# Patient Record
Sex: Male | Born: 1940 | Race: White | Hispanic: No | Smoking: Never smoker
Health system: Southern US, Community
[De-identification: ages and names within clinical notes are randomized; demographics above are authoritative.]

## PROBLEM LIST (undated history)

## (undated) DIAGNOSIS — E785 Hyperlipidemia, unspecified: Secondary | ICD-10-CM

## (undated) DIAGNOSIS — M1990 Unspecified osteoarthritis, unspecified site: Secondary | ICD-10-CM

## (undated) DIAGNOSIS — I82409 Acute embolism and thrombosis of unspecified deep veins of unspecified lower extremity: Secondary | ICD-10-CM

## (undated) DIAGNOSIS — I10 Essential (primary) hypertension: Secondary | ICD-10-CM

## (undated) DIAGNOSIS — C4490 Unspecified malignant neoplasm of skin, unspecified: Secondary | ICD-10-CM

## (undated) DIAGNOSIS — E669 Obesity, unspecified: Secondary | ICD-10-CM

## (undated) HISTORY — DX: Obesity, unspecified: E66.9

## (undated) HISTORY — PX: CERVICAL DISCECTOMY: SHX98

## (undated) HISTORY — DX: Unspecified malignant neoplasm of skin, unspecified: C44.90

## (undated) HISTORY — DX: Hyperlipidemia, unspecified: E78.5

## (undated) HISTORY — DX: Unspecified osteoarthritis, unspecified site: M19.90

## (undated) HISTORY — DX: Acute embolism and thrombosis of unspecified deep veins of unspecified lower extremity: I82.409

---

## 1999-09-13 ENCOUNTER — Encounter: Admission: RE | Admit: 1999-09-13 | Discharge: 1999-12-12 | Payer: Self-pay | Admitting: Anesthesiology

## 2000-06-22 ENCOUNTER — Emergency Department (HOSPITAL_COMMUNITY): Admission: EM | Admit: 2000-06-22 | Discharge: 2000-06-22 | Payer: Self-pay | Admitting: *Deleted

## 2000-06-22 ENCOUNTER — Encounter: Payer: Self-pay | Admitting: *Deleted

## 2000-08-01 ENCOUNTER — Encounter: Payer: Self-pay | Admitting: Internal Medicine

## 2000-08-01 ENCOUNTER — Ambulatory Visit (HOSPITAL_COMMUNITY): Admission: RE | Admit: 2000-08-01 | Discharge: 2000-08-01 | Payer: Self-pay | Admitting: Internal Medicine

## 2000-08-18 ENCOUNTER — Emergency Department (HOSPITAL_COMMUNITY): Admission: EM | Admit: 2000-08-18 | Discharge: 2000-08-18 | Payer: Self-pay | Admitting: Emergency Medicine

## 2000-08-18 ENCOUNTER — Encounter: Payer: Self-pay | Admitting: Emergency Medicine

## 2001-02-05 HISTORY — PX: COLONOSCOPY W/ POLYPECTOMY: SHX1380

## 2001-03-05 ENCOUNTER — Encounter: Payer: Self-pay | Admitting: Internal Medicine

## 2001-03-05 ENCOUNTER — Ambulatory Visit (HOSPITAL_COMMUNITY): Admission: RE | Admit: 2001-03-05 | Discharge: 2001-03-05 | Payer: Self-pay | Admitting: Internal Medicine

## 2001-06-18 ENCOUNTER — Ambulatory Visit (HOSPITAL_COMMUNITY): Admission: RE | Admit: 2001-06-18 | Discharge: 2001-06-18 | Payer: Self-pay | Admitting: Internal Medicine

## 2002-03-12 ENCOUNTER — Ambulatory Visit (HOSPITAL_COMMUNITY): Admission: RE | Admit: 2002-03-12 | Discharge: 2002-03-12 | Payer: Self-pay | Admitting: Internal Medicine

## 2002-07-10 ENCOUNTER — Encounter: Payer: Self-pay | Admitting: Internal Medicine

## 2002-07-10 ENCOUNTER — Ambulatory Visit (HOSPITAL_COMMUNITY): Admission: RE | Admit: 2002-07-10 | Discharge: 2002-07-10 | Payer: Self-pay | Admitting: Internal Medicine

## 2003-04-06 ENCOUNTER — Ambulatory Visit (HOSPITAL_COMMUNITY): Admission: RE | Admit: 2003-04-06 | Discharge: 2003-04-06 | Payer: Self-pay | Admitting: Internal Medicine

## 2003-07-22 ENCOUNTER — Ambulatory Visit (HOSPITAL_COMMUNITY): Admission: RE | Admit: 2003-07-22 | Discharge: 2003-07-22 | Payer: Self-pay | Admitting: Internal Medicine

## 2003-09-14 ENCOUNTER — Encounter (INDEPENDENT_AMBULATORY_CARE_PROVIDER_SITE_OTHER): Payer: Self-pay | Admitting: Specialist

## 2003-09-14 ENCOUNTER — Ambulatory Visit (HOSPITAL_COMMUNITY): Admission: RE | Admit: 2003-09-14 | Discharge: 2003-09-14 | Payer: Self-pay | Admitting: Internal Medicine

## 2003-09-16 ENCOUNTER — Emergency Department (HOSPITAL_COMMUNITY): Admission: EM | Admit: 2003-09-16 | Discharge: 2003-09-17 | Payer: Self-pay | Admitting: Emergency Medicine

## 2003-11-24 ENCOUNTER — Ambulatory Visit (HOSPITAL_COMMUNITY): Admission: RE | Admit: 2003-11-24 | Discharge: 2003-11-24 | Payer: Self-pay | Admitting: Internal Medicine

## 2004-04-18 ENCOUNTER — Ambulatory Visit (HOSPITAL_COMMUNITY): Admission: RE | Admit: 2004-04-18 | Discharge: 2004-04-18 | Payer: Self-pay | Admitting: Internal Medicine

## 2004-04-25 ENCOUNTER — Ambulatory Visit (HOSPITAL_COMMUNITY): Admission: RE | Admit: 2004-04-25 | Discharge: 2004-04-25 | Payer: Self-pay | Admitting: Internal Medicine

## 2004-09-25 ENCOUNTER — Ambulatory Visit (HOSPITAL_COMMUNITY): Admission: RE | Admit: 2004-09-25 | Discharge: 2004-09-25 | Payer: Self-pay | Admitting: Internal Medicine

## 2005-01-03 ENCOUNTER — Ambulatory Visit: Payer: Self-pay | Admitting: Internal Medicine

## 2005-02-15 ENCOUNTER — Ambulatory Visit (HOSPITAL_COMMUNITY): Admission: RE | Admit: 2005-02-15 | Discharge: 2005-02-15 | Payer: Self-pay | Admitting: Internal Medicine

## 2005-05-28 ENCOUNTER — Ambulatory Visit: Payer: Self-pay | Admitting: Internal Medicine

## 2005-07-09 ENCOUNTER — Ambulatory Visit (HOSPITAL_COMMUNITY): Admission: RE | Admit: 2005-07-09 | Discharge: 2005-07-09 | Payer: Self-pay | Admitting: Internal Medicine

## 2005-12-11 ENCOUNTER — Ambulatory Visit (HOSPITAL_COMMUNITY): Admission: RE | Admit: 2005-12-11 | Discharge: 2005-12-11 | Payer: Self-pay | Admitting: Internal Medicine

## 2005-12-11 ENCOUNTER — Ambulatory Visit: Payer: Self-pay | Admitting: Internal Medicine

## 2005-12-14 ENCOUNTER — Ambulatory Visit (HOSPITAL_COMMUNITY): Admission: RE | Admit: 2005-12-14 | Discharge: 2005-12-14 | Payer: Self-pay | Admitting: Internal Medicine

## 2005-12-31 ENCOUNTER — Ambulatory Visit (HOSPITAL_COMMUNITY): Admission: RE | Admit: 2005-12-31 | Discharge: 2005-12-31 | Payer: Self-pay | Admitting: Internal Medicine

## 2007-12-23 ENCOUNTER — Ambulatory Visit (HOSPITAL_COMMUNITY): Admission: RE | Admit: 2007-12-23 | Discharge: 2007-12-23 | Payer: Self-pay | Admitting: Internal Medicine

## 2008-03-08 HISTORY — PX: TOTAL KNEE ARTHROPLASTY: SHX125

## 2008-03-23 ENCOUNTER — Inpatient Hospital Stay (HOSPITAL_COMMUNITY): Admission: RE | Admit: 2008-03-23 | Discharge: 2008-03-26 | Payer: Self-pay | Admitting: Orthopaedic Surgery

## 2009-02-05 HISTORY — PX: TOTAL KNEE ARTHROPLASTY: SHX125

## 2009-04-04 ENCOUNTER — Ambulatory Visit (HOSPITAL_COMMUNITY): Admission: RE | Admit: 2009-04-04 | Discharge: 2009-04-04 | Payer: Self-pay | Admitting: Internal Medicine

## 2009-05-06 HISTORY — PX: TOTAL SHOULDER ARTHROPLASTY: SHX126

## 2009-05-26 ENCOUNTER — Ambulatory Visit (HOSPITAL_COMMUNITY): Admission: RE | Admit: 2009-05-26 | Discharge: 2009-05-27 | Payer: Self-pay | Admitting: Orthopedic Surgery

## 2009-09-26 ENCOUNTER — Encounter
Admission: RE | Admit: 2009-09-26 | Discharge: 2009-09-26 | Payer: Self-pay | Admitting: Physical Medicine and Rehabilitation

## 2009-12-06 DIAGNOSIS — I82409 Acute embolism and thrombosis of unspecified deep veins of unspecified lower extremity: Secondary | ICD-10-CM

## 2009-12-06 HISTORY — DX: Acute embolism and thrombosis of unspecified deep veins of unspecified lower extremity: I82.409

## 2009-12-20 ENCOUNTER — Inpatient Hospital Stay (HOSPITAL_COMMUNITY): Admission: RE | Admit: 2009-12-20 | Discharge: 2009-12-24 | Payer: Self-pay | Admitting: Orthopedic Surgery

## 2009-12-22 ENCOUNTER — Encounter (INDEPENDENT_AMBULATORY_CARE_PROVIDER_SITE_OTHER): Payer: Self-pay | Admitting: Orthopedic Surgery

## 2009-12-22 ENCOUNTER — Ambulatory Visit: Payer: Self-pay | Admitting: Vascular Surgery

## 2009-12-24 ENCOUNTER — Encounter (INDEPENDENT_AMBULATORY_CARE_PROVIDER_SITE_OTHER): Payer: Self-pay | Admitting: *Deleted

## 2009-12-24 LAB — CONVERTED CEMR LAB
BUN: 10 mg/dL
Calcium: 8.2 mg/dL
Chloride: 96 meq/L
Creatinine, Ser: 0.75 mg/dL
Hemoglobin: 11 g/dL
MCV: 86 fL
WBC: 13.8 10*3/uL

## 2010-01-23 ENCOUNTER — Ambulatory Visit (HOSPITAL_COMMUNITY)
Admission: RE | Admit: 2010-01-23 | Discharge: 2010-01-23 | Payer: Self-pay | Source: Home / Self Care | Attending: Orthopedic Surgery | Admitting: Orthopedic Surgery

## 2010-01-23 ENCOUNTER — Encounter (INDEPENDENT_AMBULATORY_CARE_PROVIDER_SITE_OTHER): Payer: Self-pay | Admitting: Orthopedic Surgery

## 2010-02-14 ENCOUNTER — Encounter (INDEPENDENT_AMBULATORY_CARE_PROVIDER_SITE_OTHER): Payer: Self-pay | Admitting: *Deleted

## 2010-02-15 ENCOUNTER — Ambulatory Visit
Admission: RE | Admit: 2010-02-15 | Discharge: 2010-02-15 | Payer: Self-pay | Source: Home / Self Care | Attending: Cardiology | Admitting: Cardiology

## 2010-02-15 DIAGNOSIS — E785 Hyperlipidemia, unspecified: Secondary | ICD-10-CM | POA: Insufficient documentation

## 2010-02-15 DIAGNOSIS — E669 Obesity, unspecified: Secondary | ICD-10-CM | POA: Insufficient documentation

## 2010-02-16 ENCOUNTER — Encounter: Payer: Self-pay | Admitting: Cardiology

## 2010-02-20 ENCOUNTER — Encounter: Payer: Self-pay | Admitting: Cardiology

## 2010-02-20 ENCOUNTER — Encounter (INDEPENDENT_AMBULATORY_CARE_PROVIDER_SITE_OTHER): Payer: Self-pay | Admitting: *Deleted

## 2010-02-20 LAB — CONVERTED CEMR LAB
Basophils Absolute: 0 10*3/uL (ref 0.0–0.1)
Basophils Relative: 0 % (ref 0–1)
Eosinophils Absolute: 0.1 10*3/uL (ref 0.0–0.7)
Eosinophils Relative: 1 % (ref 0–5)
Hemoglobin: 14.9 g/dL (ref 13.0–17.0)
MCHC: 33.7 g/dL (ref 30.0–36.0)
MCV: 83.7 fL (ref 78.0–100.0)
Monocytes Absolute: 0.6 10*3/uL (ref 0.1–1.0)
Monocytes Relative: 8 % (ref 3–12)
Neutro Abs: 5.1 10*3/uL (ref 1.7–7.7)
RBC: 5.28 M/uL (ref 4.22–5.81)
RDW: 14.8 % (ref 11.5–15.5)

## 2010-02-24 ENCOUNTER — Encounter (INDEPENDENT_AMBULATORY_CARE_PROVIDER_SITE_OTHER): Payer: Self-pay | Admitting: *Deleted

## 2010-02-26 ENCOUNTER — Encounter: Payer: Self-pay | Admitting: Internal Medicine

## 2010-02-26 ENCOUNTER — Encounter: Payer: Self-pay | Admitting: Orthopedic Surgery

## 2010-02-27 ENCOUNTER — Ambulatory Visit: Admission: RE | Admit: 2010-02-27 | Discharge: 2010-02-27 | Payer: Self-pay | Source: Home / Self Care

## 2010-03-07 ENCOUNTER — Telehealth (INDEPENDENT_AMBULATORY_CARE_PROVIDER_SITE_OTHER): Payer: Self-pay | Admitting: *Deleted

## 2010-03-09 NOTE — Assessment & Plan Note (Signed)
Summary: **per Dr.Fanta for DVT management/tg   Visit Type:  Initial Consult Primary Provider:  Dr. Avon Gully   History of Present Illness: [name] is seen at the kind request of Dr. Felecia Shelling for treatment of left deep vein thrombosis.  This nice gentleman underwent left TKA in November, but was noted to have swelling of the left leg 3 days postoperatively at which time a duplex ultrasound study was positive for thrombosis.  With additional anticoagulation, all swelling in that leg has resolved.  He has done well with physical therapy and is walking fine with no discomfort.  INR was 2.1 two weeks ago.    Current Medications (verified): 1)  Daily-Vitamin  Tabs (Multiple Vitamin) .... Take 1 Tab Daily 2)  Vitamin D3 2000 Unit Caps (Cholecalciferol) .... Take 1 Tab Daily 3)  Propranolol-Hctz 40-25 Mg Tabs (Propranolol-Hctz) .... Take 1 Tab Daily 4)  Gabapentin 600 Mg Tabs (Gabapentin) .... Take 1 To 2 Tabs Two Times A Day 5)  Simvastatin 40 Mg Tabs (Simvastatin) .... Take 1 Tab Daily 6)  Glyburide-Metformin 5-500 Mg Tabs (Glyburide-Metformin) .... Take 1 Tab Daily 7)  Warfarin Sodium 5 Mg Tabs (Warfarin Sodium) .... Take As Directed Per Coumadin Clinic 8)  Percocet 5-325 Mg Tabs (Oxycodone-Acetaminophen) .... Take As Directed For Pain  Allergies (verified): No Known Drug Allergies  Comments:  Nurse/Medical Assistant: patient brought med list [facility] is patients pharmacy  Past History:  Family History: Last updated: 06-Mar-2010 Father: Died at age 27 is the result of neoplastic disease Mother: Deceased at age 63 with CVA Siblings: 3 of 4 brothers are deceased, one is the result of pneumonia and 2 due to lung cancer; living brother has cardiac problems.  3 of 5 sisters are alive.  One died with myocardial infarction and one with carcinoma of the lung.  Social History: Last updated: 03/06/2010 Employment-retired from electrical work Tobacco Use - No.  Alcohol Use -  no Regular Exercise - no Drug Use - no Single with 3 children  Past Medical History: Hypertension Hyperlipidemia Diabetes: Hemoglobin A1c of 7 in the in 12/2009 Obesity Deep vein thrombosis-left lower extremity in the in 12/2009 following left TKA Degenerative joint disease-left knee; cervical spine; lumbosacral spine  Past Surgical History: Left TKA-2011 Right TKA-03/2008 Right total shoulder arthroplasty-05/2009 Cervical discectomy and fusion Colonoscopy with polypectomy-2003  Carotid Doppler  Procedure date:  11/24/2003  Findings:       Clinical data:   Pre syncope.   IMPRESSION   There is mild calcific plaque adjacent to both carotid bifurcations,   but no evidence of hemodynamically significant stenosis.  Vertebral artery flow is cephalad bilaterally.  Carotid Doppler  Procedure date:  Mar 06, 2010  Findings:      Normal sinus rhythm Left axis deviation Nonspecific T wave abnormality No previous tracing for comparison.  -  Date:  12/24/2009    HgbA1c: 7    WBC: 13.8    HGB: 11    HCT: 30.9    PLT: 138    MCV: 86   Family History: Father: Died at age 2 is the result of neoplastic disease Mother: Deceased at age 96 with CVA Siblings: 3 of 4 brothers are deceased, one is the result of pneumonia and 2 due to lung cancer; living brother has cardiac problems.  3 of 5 sisters are alive.  One died with myocardial infarction and one with carcinoma of the lung.  Social History: Employment-retired from [employer] work Tobacco Use - No.  Alcohol Use -  no Regular Exercise - no Drug Use - no Single with 3 children  Review of Systems       History of hypertension that has been well treated; diffuse arthritic discomfort.  All other systems reviewed and are negative.  Vital Signs:  Patient profile:   70 year old male Height:      71 inches Weight:      212 pounds BMI:     29.67 O2 Sat:      97 % on Room air Pulse rate:   64 / minute BP sitting:   138 / 80   (left arm)  Vitals Entered By: Dreama Saa, CNA (February 15, 2010 1:28 PM)  O2 Flow:  Room air  Physical Exam  General:  Mildly overweight; well-developed; no acute distress: HEENT-[place]/AT; PERRL; EOM intact; conjunctiva and lids nl:  Neck-No JVD; no carotid bruits: Endocrine-No thyromegaly: Lungs-No tachypnea, clear without rales, rhonchi or wheezes: CV-normal PMI; normal S1 and S2; S4 present Abdomen-BS normal; soft and non-tender without masses or organomegaly: MS-No deformities, cyanosis or clubbing: Neurologic-Nl cranial nerves; symmetric strength and tone: Skin- Warm, no sig. lesions: Extremities-Nl distal pulses; no edema    Impression & Recommendations:  Problem # 1:  DEEP VENOUS THROMBOPHLEBITIS: POST-OP (ICD-453.40) Since deep vein thrombosis occurred in the setting of a clear precipitating event (orthopaedic surgery), only a 6 month course of anticoagulation is warranted.  Nearly 2 months have been completed, and we will plan to maintain him in [facility]madin Clinic until May.  Problem # 2:  HYPERLIPIDEMIA (ICD-272.4) No recent fasting lipid profiles are available for review.  In the absence of known vascular disease, more aggressive therapy is probably not warranted.     Problem # 3:  HYPERTENSION (ICD-401.1) Blood pressure control appears to be adequate.  Current medications will be continued.  BP today: 138/80  Labs Reviewed: K+: 3.7 (12/24/2009) Creat: : 0.75 (12/24/2009)     Other Orders: T-CBC w/Diff ([id number]) Hemoccult Cards (Take Home) (Hemoccult Cards)  Patient Instructions: 1)  Your physician recommends that you schedule a follow-up appointment in: May 2012 2)  Your physician recommends that you return for lab work [id number] 3)  Your physician has asked that you test your stool for blood. It is necessary to test 3 different stool specimens for accuracy. You will be given 3 hemoccult cards for specimen collection. For each stool specimen, place a  small portion of stool sample (from 2 different areas of the stool) into the 2 squares on the card. Close card. Repeat with 2 more stool specimens. Bring the cards back to the office for testing.

## 2010-03-09 NOTE — Medication Information (Signed)
Summary: ccr-lr  Anticoagulant Therapy  Managed by: Vashti Hey, RN PCP: Dr. Avon Gully Supervising MD: Dietrich Pates MD, Molly Maduro Indication 1: DVT Lab Used: [facility] [place] Site: [place] INR POC 1.4  Dietary changes: yes       Details: had vit k foods  Health status changes: no    Bleeding/hemorrhagic complications: no       Any missed doses?: yes     Details: missed 1 dose  Is patient compliant with meds? yes       Allergies: No Known Drug Allergies  Anticoagulation Management History:      The patient is taking warfarin and comes in today for a routine follow up visit.  Positive risk factors for bleeding include an age of 68 years or older and presence of serious comorbidities.  The bleeding index is 'intermediate risk'.  Positive CHADS2 values include History of HTN and History of Diabetes.  Negative CHADS2 values include Age > 68 years old.  Anticoagulation responsible provider: Dietrich Pates MD, Molly Maduro.  INR POC: 1.4.  Cuvette Lot#: [id number].    Anticoagulation Management Assessment/Plan:      The patient's current anticoagulation dose is Warfarin sodium 5 mg tabs: take as directed per coumadin clinic.  The target INR is 2.0-3.0.  The next INR is due 03/16/2010.  Anticoagulation instructions were given to patient.  Results were reviewed/authorized by Vashti Hey, RN.  He was notified by Vashti Hey RN.         Prior Anticoagulation Instructions: INR 5.4 Hold coumadin tonight and tomorrow night, take 1/2 tablet Friday night then resume 1/2 tablet once daily except 1 tablet on Fridays  Current Anticoagulation Instructions: INR 1.4 Take coumadin 1 tablet tonight then resume 2.5mg  once daily except 1 tablet on Fridays

## 2010-03-09 NOTE — Medication Information (Signed)
Summary: CCR  Anticoagulant Therapy  Managed by: Vashti Hey, RN Supervising MD: Dietrich Pates MD, Molly Maduro Indication 1: DVT Lab Used: [facility] [place] Site: [place] INR POC 5.4  Dietary changes: no    Health status changes: no    Bleeding/hemorrhagic complications: no    Recent/future hospitalizations: yes       Details: recent DVT  Dr Felecia Shelling check INr last  INR 2.1  Any changes in medication regimen? yes       Details: On zpak for bronchitis   Has 2 days left   Will finish 03/20/10  Recent/future dental: no  Any missed doses?: no       Is patient compliant with meds? yes       Allergies: No Known Drug Allergies  Anticoagulation Management History:      The patient is taking warfarin and comes in today for a routine follow up visit.  Positive risk factors for bleeding include an age of 68 years or older.  The bleeding index is 'intermediate risk'.  Negative CHADS2 values include Age > 51 years old.  Anticoagulation responsible provider: Dietrich Pates MD, Molly Maduro.  INR POC: 5.4.    Anticoagulation Management Assessment/Plan:      The target INR is 2.0-3.0.  The next INR is due 02/27/2010.  Anticoagulation instructions were given to patient.  Results were reviewed/authorized by Vashti Hey, RN.  He was notified by Vashti Hey RN.        Coagulation management information includes: May be able to come off coumadin in May 12.  Has F/U with RR then.  Current Anticoagulation Instructions: INR 5.4 Hold coumadin tonight and tomorrow night, take 1/2 tablet Friday night then resume 1/2 tablet once daily except 1 tablet on Fridays

## 2010-03-09 NOTE — Miscellaneous (Signed)
Summary: hemoccult cards 02/20/2010  Clinical Lists Changes  Observations: Added new observation of HEMOCCULT 3: neg (02/20/2010 16:24) Added new observation of HEMOCCULT 2: neg (02/20/2010 16:24) Added new observation of HEMOCCULT 1: neg (02/20/2010 16:24)

## 2010-03-13 ENCOUNTER — Encounter (HOSPITAL_COMMUNITY): Payer: Self-pay

## 2010-03-13 ENCOUNTER — Ambulatory Visit (HOSPITAL_COMMUNITY)
Admission: RE | Admit: 2010-03-13 | Discharge: 2010-03-13 | Disposition: A | Discharge status: Home / Self Care | Payer: Medicare HMO | Source: Ambulatory Visit | Attending: Internal Medicine | Admitting: Internal Medicine

## 2010-03-13 ENCOUNTER — Other Ambulatory Visit (HOSPITAL_COMMUNITY): Payer: Self-pay | Admitting: Internal Medicine

## 2010-03-13 DIAGNOSIS — J40 Bronchitis, not specified as acute or chronic: Secondary | ICD-10-CM

## 2010-03-13 DIAGNOSIS — R0789 Other chest pain: Secondary | ICD-10-CM | POA: Insufficient documentation

## 2010-03-13 DIAGNOSIS — R059 Cough, unspecified: Secondary | ICD-10-CM | POA: Insufficient documentation

## 2010-03-13 DIAGNOSIS — R05 Cough: Secondary | ICD-10-CM | POA: Insufficient documentation

## 2010-03-13 HISTORY — DX: Essential (primary) hypertension: I10

## 2010-03-15 NOTE — Progress Notes (Signed)
Summary: coumadin concerns  Phone Note Call from Patient   Caller: Patient Reason for Call: Talk to Nurse Summary of Call: patient states that since he has been on Coumadin he has been having "flu like" symptoms / advised patient that Coumadin would not give him those symptoms but he still would like to speak with you / tg Initial call taken by: Raechel Ache [facility],  March 07, 2010 8:52 AM  Follow-up for Phone Call        Pt states he has had 2 colds since starting on coumadin and Dr Felecia Shelling has put him on antibiotics.  Pt is concerned that coumadin is making him more prone to colds.  Told him coumadin was not causing him to catch cols and if Dr Felecia Shelling is putting him on Abx he must have some type of infection.  INR will be checked on 03/16/10 as ordered.  Pt verbalized understanding. Follow-up by: Vashti Hey RN,  March 07, 2010 9:07 AM

## 2010-03-16 ENCOUNTER — Encounter (INDEPENDENT_AMBULATORY_CARE_PROVIDER_SITE_OTHER): Payer: Medicare HMO

## 2010-03-16 ENCOUNTER — Encounter: Payer: Self-pay | Admitting: Cardiology

## 2010-03-16 DIAGNOSIS — Z7901 Long term (current) use of anticoagulants: Secondary | ICD-10-CM

## 2010-03-16 DIAGNOSIS — I80299 Phlebitis and thrombophlebitis of other deep vessels of unspecified lower extremity: Secondary | ICD-10-CM

## 2010-03-16 LAB — CONVERTED CEMR LAB: POC INR: 2.6

## 2010-03-23 NOTE — Medication Information (Signed)
Summary: Coumadin Clinic  Anticoagulant Therapy Managed by: Vashti Hey, RN Patient Assessment Part 2:  Have you MISSED ANY DOSES or CHANGED TABLETS?  0  Have you had any BRUISING or BLEEDING ( nose or gum bleeds,blood in urine or stool)?  Have you STARTED or STOPPED any MEDICATIONS, including OTC meds,herbals or supplements?  Have you CHANGED your DIET, especially green vegetables,or ALCOHOL intake?  Have you had any ILLNESSES or HOSPITALIZATIONS?  Have you had any signs of CLOTTING?(chest discomfort,dizziness,shortness of breath,arms tingling,slurred speech,swelling or redness in leg)       Regimen Out:    Total Weekly: 20 mg mg  Next INR Due: 04/06/2010      Allergies: No Known Drug Allergies  Anticoagulant Therapy  Managed by: Vashti Hey, RN PCP: Dr. Avon Gully Supervising MD: Diona Browner MD, Remi Deter Indication 1: DVT Lab Used: [facility] [place] Site: [place] INR POC 2.6  Dietary changes: no    Health status changes: no    Bleeding/hemorrhagic complications: no    Recent/future hospitalizations: no    Any changes in medication regimen? no    Recent/future dental: no  Any missed doses?: no       Is patient compliant with meds? yes         Anticoagulation Management History:      The patient is taking warfarin and comes in today for a routine follow up visit.  Positive risk factors for bleeding include an age of 62 years or older and presence of serious comorbidities.  The bleeding index is 'intermediate risk'.  Positive CHADS2 values include History of HTN and History of Diabetes.  Negative CHADS2 values include Age > 14 years old.  Anticoagulation responsible provider: Diona Browner MD, Remi Deter.  INR POC: 2.6.  Cuvette Lot#: [id number].    Anticoagulation Management Assessment/Plan:      The patient's current anticoagulation dose is Warfarin sodium 5 mg tabs: take as directed per coumadin clinic.  The target INR is 2.0-3.0.  The next INR  is due 04/06/2010.  Anticoagulation instructions were given to patient.  Results were reviewed/authorized by Vashti Hey, RN.  He was notified by Vashti Hey RN.         Prior Anticoagulation Instructions: INR 1.4 Take coumadin 1 tablet tonight then resume 2.5mg  once daily except 1 tablet on Fridays  Current Anticoagulation Instructions: INR 2.6 Continue coumadin 2.5mg  once daily except 5mg  on Fridays Scheduled for back injection on 03/31/10 by Dr Modesta Messing Pt told to hold couamdin 5 days prior to injection.  Will resume coumadin night of injection

## 2010-04-06 ENCOUNTER — Encounter: Payer: Self-pay | Admitting: Cardiology

## 2010-04-06 ENCOUNTER — Encounter (INDEPENDENT_AMBULATORY_CARE_PROVIDER_SITE_OTHER): Payer: Medicare HMO

## 2010-04-06 DIAGNOSIS — I80299 Phlebitis and thrombophlebitis of other deep vessels of unspecified lower extremity: Secondary | ICD-10-CM

## 2010-04-06 LAB — CONVERTED CEMR LAB: POC INR: 1.6

## 2010-04-13 NOTE — Medication Information (Signed)
Summary: ccr-lr  Anticoagulant Therapy  Managed by: Vashti Hey, RN PCP: Dr. Avon Gully Supervising MD: Diona Browner MD, Remi Deter Indication 1: DVT Lab Used: [facility] [place] Site: [place] INR POC 1.6  Dietary changes: no    Health status changes: no    Bleeding/hemorrhagic complications: no    Recent/future hospitalizations: no    Any changes in medication regimen? no    Recent/future dental: no  Any missed doses?: yes     Details: was off coumadin 5 days prior to back injection on 03/31/10  Is patient compliant with meds? yes       Allergies: No Known Drug Allergies  Anticoagulation Management History:      The patient is taking warfarin and comes in today for a routine follow up visit.  Positive risk factors for bleeding include an age of 12 years or older and presence of serious comorbidities.  The bleeding index is 'intermediate risk'.  Positive CHADS2 values include History of HTN and History of Diabetes.  Negative CHADS2 values include Age > 76 years old.  Anticoagulation responsible provider: Diona Browner MD, Remi Deter.  INR POC: 1.6.  Cuvette Lot#: [id number].    Anticoagulation Management Assessment/Plan:      The patient's current anticoagulation dose is Warfarin sodium 5 mg tabs: take as directed per coumadin clinic.  The target INR is 2.0-3.0.  The next INR is due 04/24/2010.  Anticoagulation instructions were given to patient.  Results were reviewed/authorized by Vashti Hey, RN.  He was notified by Vashti Hey RN.         Prior Anticoagulation Instructions: INR 2.6 Continue coumadin 2.5mg  once daily except 5mg  on Fridays Scheduled for back injection on 03/31/10 by Dr Modesta Messing Pt told to hold couamdin 5 days prior to injection.  Will resume coumadin night of injection  Current Anticoagulation Instructions: INR 1.6 Take coumadin 1 1/2 tablets tonight then resume 1/2 tablet once daily except 1 tablet on Fridays

## 2010-04-18 LAB — CBC
HCT: 45.3 % (ref 39.0–52.0)
Hemoglobin: 11.4 g/dL — ABNORMAL LOW (ref 13.0–17.0)
Hemoglobin: 16.3 g/dL (ref 13.0–17.0)
MCH: 30.4 pg (ref 26.0–34.0)
MCH: 30.7 pg (ref 26.0–34.0)
MCHC: 35.6 g/dL (ref 30.0–36.0)
MCV: 85.5 fL (ref 78.0–100.0)
MCV: 86.1 fL (ref 78.0–100.0)
MCV: 86.3 fL (ref 78.0–100.0)
MCV: 87.1 fL (ref 78.0–100.0)
Platelets: 114 10*3/uL — ABNORMAL LOW (ref 150–400)
Platelets: 134 10*3/uL — ABNORMAL LOW (ref 150–400)
Platelets: 138 10*3/uL — ABNORMAL LOW (ref 150–400)
RBC: 3.58 MIL/uL — ABNORMAL LOW (ref 4.22–5.81)
RBC: 3.75 MIL/uL — ABNORMAL LOW (ref 4.22–5.81)
RDW: 12.8 % (ref 11.5–15.5)
RDW: 13.3 % (ref 11.5–15.5)
WBC: 11.2 10*3/uL — ABNORMAL HIGH (ref 4.0–10.5)
WBC: 7.4 10*3/uL (ref 4.0–10.5)

## 2010-04-18 LAB — BASIC METABOLIC PANEL
BUN: 10 mg/dL (ref 6–23)
BUN: 5 mg/dL — ABNORMAL LOW (ref 6–23)
CO2: 27 mEq/L (ref 19–32)
CO2: 32 mEq/L (ref 19–32)
Calcium: 8.2 mg/dL — ABNORMAL LOW (ref 8.4–10.5)
Calcium: 8.4 mg/dL (ref 8.4–10.5)
Calcium: 8.8 mg/dL (ref 8.4–10.5)
Chloride: 104 mEq/L (ref 96–112)
Chloride: 91 mEq/L — ABNORMAL LOW (ref 96–112)
Chloride: 96 mEq/L (ref 96–112)
Creatinine, Ser: 0.71 mg/dL (ref 0.4–1.5)
Creatinine, Ser: 0.75 mg/dL (ref 0.4–1.5)
Creatinine, Ser: 0.76 mg/dL (ref 0.4–1.5)
Creatinine, Ser: 0.84 mg/dL (ref 0.4–1.5)
GFR calc Af Amer: >60 mL/min (ref >60)
GFR calc Af Amer: >60 mL/min (ref >60)
GFR calc non Af Amer: >60 mL/min (ref >60)
GFR calc non Af Amer: >60 mL/min (ref >60)
GFR calc non Af Amer: >60 mL/min (ref >60)
GFR calc non Af Amer: >60 mL/min (ref >60)
Glucose, Bld: 115 mg/dL — ABNORMAL HIGH (ref 70–99)
Glucose, Bld: 163 mg/dL — ABNORMAL HIGH (ref 70–99)
Potassium: 3.8 mEq/L (ref 3.5–5.1)
Sodium: 136 mEq/L (ref 135–145)

## 2010-04-18 LAB — URINALYSIS, ROUTINE W REFLEX MICROSCOPIC
Bilirubin Urine: NEGATIVE
Ketones, ur: 40 mg/dL — AB
Nitrite: NEGATIVE
Urobilinogen, UA: 1 mg/dL (ref 0.0–1.0)

## 2010-04-18 LAB — GLUCOSE, CAPILLARY
Glucose-Capillary: 148 mg/dL — ABNORMAL HIGH (ref 70–99)
Glucose-Capillary: 191 mg/dL — ABNORMAL HIGH (ref 70–99)
Glucose-Capillary: 191 mg/dL — ABNORMAL HIGH (ref 70–99)
Glucose-Capillary: 193 mg/dL — ABNORMAL HIGH (ref 70–99)
Glucose-Capillary: 219 mg/dL — ABNORMAL HIGH (ref 70–99)
Glucose-Capillary: 227 mg/dL — ABNORMAL HIGH (ref 70–99)
Glucose-Capillary: 254 mg/dL — ABNORMAL HIGH (ref 70–99)

## 2010-04-18 LAB — HEMOGLOBIN A1C
Hgb A1c MFr Bld: 7 % — ABNORMAL HIGH (ref <5.7)
Mean Plasma Glucose: 154 mg/dL — ABNORMAL HIGH (ref <117)

## 2010-04-18 LAB — TYPE AND SCREEN
ABO/RH(D): A NEG
Antibody Screen: NEGATIVE

## 2010-04-18 LAB — PROTIME-INR
INR: 1.02 (ref 0.00–1.49)
INR: 2.57 — ABNORMAL HIGH (ref 0.00–1.49)
Prothrombin Time: 17.5 seconds — ABNORMAL HIGH (ref 11.6–15.2)

## 2010-04-18 LAB — SURGICAL PCR SCREEN
MRSA, PCR: NEGATIVE
Staphylococcus aureus: NEGATIVE

## 2010-04-24 ENCOUNTER — Encounter: Payer: Self-pay | Admitting: Cardiology

## 2010-04-24 ENCOUNTER — Encounter (INDEPENDENT_AMBULATORY_CARE_PROVIDER_SITE_OTHER): Payer: Medicare HMO

## 2010-04-24 DIAGNOSIS — Z7901 Long term (current) use of anticoagulants: Secondary | ICD-10-CM

## 2010-04-24 DIAGNOSIS — I80299 Phlebitis and thrombophlebitis of other deep vessels of unspecified lower extremity: Secondary | ICD-10-CM

## 2010-04-25 LAB — URINALYSIS, ROUTINE W REFLEX MICROSCOPIC
Bilirubin Urine: NEGATIVE
Glucose, UA: NEGATIVE mg/dL
Hgb urine dipstick: NEGATIVE
Ketones, ur: NEGATIVE mg/dL
Specific Gravity, Urine: 1.022 (ref 1.005–1.030)
pH: 6 (ref 5.0–8.0)

## 2010-04-25 LAB — CBC
HCT: 46.4 % (ref 39.0–52.0)
Hemoglobin: 16.4 g/dL (ref 13.0–17.0)
MCHC: 35.3 g/dL (ref 30.0–36.0)
RDW: 14.1 % (ref 11.5–15.5)

## 2010-04-25 LAB — COMPREHENSIVE METABOLIC PANEL
BUN: 12 mg/dL (ref 6–23)
Calcium: 9.8 mg/dL (ref 8.4–10.5)
Glucose, Bld: 109 mg/dL — ABNORMAL HIGH (ref 70–99)
Sodium: 139 mEq/L (ref 135–145)
Total Protein: 6.5 g/dL (ref 6.0–8.3)

## 2010-04-25 LAB — PROTIME-INR
INR: 1.09 (ref 0.00–1.49)
Prothrombin Time: 14 seconds (ref 11.6–15.2)

## 2010-04-25 LAB — GLUCOSE, CAPILLARY
Glucose-Capillary: 210 mg/dL — ABNORMAL HIGH (ref 70–99)
Glucose-Capillary: 217 mg/dL — ABNORMAL HIGH (ref 70–99)
Glucose-Capillary: 231 mg/dL — ABNORMAL HIGH (ref 70–99)

## 2010-05-04 NOTE — Medication Information (Signed)
Summary: ccr-lr  Anticoagulant Therapy  Managed by: Vashti Hey, RN PCP: Dr. Avon Gully Supervising MD: Dietrich Pates MD, Molly Maduro Indication 1: DVT Lab Used: [facility] [place] Site: [place] INR POC 2.6  Dietary changes: no    Health status changes: no    Bleeding/hemorrhagic complications: no    Recent/future hospitalizations: no    Any changes in medication regimen? no    Recent/future dental: no  Any missed doses?: no       Is patient compliant with meds? yes       Allergies: No Known Drug Allergies  Anticoagulation Management History:      The patient is taking warfarin and comes in today for a routine follow up visit.  Positive risk factors for bleeding include an age of 69 years or older and presence of serious comorbidities.  The bleeding index is 'intermediate risk'.  Positive CHADS2 values include History of HTN and History of Diabetes.  Negative CHADS2 values include Age > 47 years old.  Anticoagulation responsible provider: Dietrich Pates MD, Molly Maduro.  INR POC: 2.6.  Cuvette Lot#: [id number].    Anticoagulation Management Assessment/Plan:      The patient's current anticoagulation dose is Warfarin sodium 5 mg tabs: take as directed per coumadin clinic.  The target INR is 2.0-3.0.  The next INR is due 05/15/2010.  Anticoagulation instructions were given to patient.  Results were reviewed/authorized by Vashti Hey, RN.  He was notified by Vashti Hey RN.         Prior Anticoagulation Instructions: INR 1.6 Take coumadin 1 1/2 tablets tonight then resume 1/2 tablet once daily except 1 tablet on Fridays  Current Anticoagulation Instructions: INR 2.6 Continue coumadin 2.5mg  once daily except 5mg  on Fridays

## 2010-05-05 ENCOUNTER — Encounter: Payer: Self-pay | Admitting: Cardiology

## 2010-05-05 DIAGNOSIS — I82409 Acute embolism and thrombosis of unspecified deep veins of unspecified lower extremity: Secondary | ICD-10-CM

## 2010-05-15 ENCOUNTER — Ambulatory Visit (INDEPENDENT_AMBULATORY_CARE_PROVIDER_SITE_OTHER): Payer: Medicare HMO | Admitting: *Deleted

## 2010-05-15 DIAGNOSIS — Z7901 Long term (current) use of anticoagulants: Secondary | ICD-10-CM

## 2010-05-15 DIAGNOSIS — I82409 Acute embolism and thrombosis of unspecified deep veins of unspecified lower extremity: Secondary | ICD-10-CM

## 2010-05-15 LAB — POCT INR: INR: 3.1

## 2010-05-23 LAB — URINALYSIS, ROUTINE W REFLEX MICROSCOPIC
Bilirubin Urine: NEGATIVE
Glucose, UA: NEGATIVE mg/dL
Hgb urine dipstick: NEGATIVE
Protein, ur: NEGATIVE mg/dL

## 2010-05-23 LAB — COMPREHENSIVE METABOLIC PANEL
ALT: 35 U/L (ref 0–53)
Alkaline Phosphatase: 68 U/L (ref 39–117)
BUN: 11 mg/dL (ref 6–23)
CO2: 25 mEq/L (ref 19–32)
Calcium: 9.5 mg/dL (ref 8.4–10.5)
GFR calc non Af Amer: >60 mL/min (ref >60)
Glucose, Bld: 129 mg/dL — ABNORMAL HIGH (ref 70–99)
Potassium: 4 mEq/L (ref 3.5–5.1)
Sodium: 137 mEq/L (ref 135–145)
Total Protein: 6.4 g/dL (ref 6.0–8.3)

## 2010-05-23 LAB — CROSSMATCH

## 2010-05-23 LAB — CBC
HCT: 42.5 % (ref 39.0–52.0)
HCT: 33.6 % — ABNORMAL LOW (ref 39.0–52.0)
Hemoglobin: 14.8 g/dL (ref 13.0–17.0)
Hemoglobin: 12.3 g/dL — ABNORMAL LOW (ref 13.0–17.0)
MCHC: 34.9 g/dL (ref 30.0–36.0)
MCHC: 35.2 g/dL (ref 30.0–36.0)
MCHC: 35.7 g/dL (ref 30.0–36.0)
Platelets: 103 10*3/uL — ABNORMAL LOW (ref 150–400)
Platelets: 95 10*3/uL — ABNORMAL LOW (ref 150–400)
RBC: 4.6 MIL/uL (ref 4.22–5.81)
RBC: 3.3 MIL/uL — ABNORMAL LOW (ref 4.22–5.81)
RBC: 3.76 MIL/uL — ABNORMAL LOW (ref 4.22–5.81)
RBC: 4.23 MIL/uL (ref 4.22–5.81)
RDW: 12.9 % (ref 11.5–15.5)
RDW: 12.8 % (ref 11.5–15.5)
RDW: 12.8 % (ref 11.5–15.5)
RDW: 13 % (ref 11.5–15.5)
WBC: 14.6 10*3/uL — ABNORMAL HIGH (ref 4.0–10.5)
WBC: 9.8 10*3/uL (ref 4.0–10.5)

## 2010-05-23 LAB — DIFFERENTIAL
Basophils Absolute: 0 10*3/uL (ref 0.0–0.1)
Basophils Absolute: 0 10*3/uL (ref 0.0–0.1)
Basophils Absolute: 0 10*3/uL (ref 0.0–0.1)
Basophils Absolute: 0 10*3/uL (ref 0.0–0.1)
Basophils Absolute: 0 10*3/uL (ref 0.0–0.1)
Basophils Relative: 0 % (ref 0–1)
Basophils Relative: 0 % (ref 0–1)
Basophils Relative: 0 % (ref 0–1)
Eosinophils Absolute: 0.1 10*3/uL (ref 0.0–0.7)
Eosinophils Relative: 0 % (ref 0–5)
Lymphocytes Relative: 16 % (ref 12–46)
Lymphocytes Relative: 5 % — ABNORMAL LOW (ref 12–46)
Lymphs Abs: 1.5 10*3/uL (ref 0.7–4.0)
Monocytes Absolute: 0.4 10*3/uL (ref 0.1–1.0)
Monocytes Absolute: 0.6 10*3/uL (ref 0.1–1.0)
Monocytes Relative: 6 % (ref 3–12)
Monocytes Relative: 4 % (ref 3–12)
Monocytes Relative: 6 % (ref 3–12)
Neutro Abs: 11.1 10*3/uL — ABNORMAL HIGH (ref 1.7–7.7)
Neutro Abs: 13.3 10*3/uL — ABNORMAL HIGH (ref 1.7–7.7)
Neutro Abs: 7.5 10*3/uL (ref 1.7–7.7)
Neutro Abs: 9.3 10*3/uL — ABNORMAL HIGH (ref 1.7–7.7)
Neutrophils Relative %: 77 % (ref 43–77)
Neutrophils Relative %: 80 % — ABNORMAL HIGH (ref 43–77)
Neutrophils Relative %: 84 % — ABNORMAL HIGH (ref 43–77)

## 2010-05-23 LAB — BASIC METABOLIC PANEL
BUN: 8 mg/dL (ref 6–23)
CO2: 28 mEq/L (ref 19–32)
CO2: 30 mEq/L (ref 19–32)
CO2: 30 mEq/L (ref 19–32)
Calcium: 8.4 mg/dL (ref 8.4–10.5)
Calcium: 8.9 mg/dL (ref 8.4–10.5)
Calcium: 9 mg/dL (ref 8.4–10.5)
Chloride: 101 mEq/L (ref 96–112)
Creatinine, Ser: 0.68 mg/dL (ref 0.4–1.5)
Creatinine, Ser: 0.73 mg/dL (ref 0.4–1.5)
Creatinine, Ser: 0.82 mg/dL (ref 0.4–1.5)
GFR calc Af Amer: >60 mL/min (ref >60)
GFR calc Af Amer: >60 mL/min (ref >60)
GFR calc Af Amer: >60 mL/min (ref >60)
GFR calc non Af Amer: >60 mL/min (ref >60)
GFR calc non Af Amer: >60 mL/min (ref >60)
Glucose, Bld: 154 mg/dL — ABNORMAL HIGH (ref 70–99)
Glucose, Bld: 190 mg/dL — ABNORMAL HIGH (ref 70–99)
Sodium: 133 mEq/L — ABNORMAL LOW (ref 135–145)

## 2010-05-23 LAB — GLUCOSE, CAPILLARY
Glucose-Capillary: 139 mg/dL — ABNORMAL HIGH (ref 70–99)
Glucose-Capillary: 145 mg/dL — ABNORMAL HIGH (ref 70–99)
Glucose-Capillary: 153 mg/dL — ABNORMAL HIGH (ref 70–99)
Glucose-Capillary: 156 mg/dL — ABNORMAL HIGH (ref 70–99)
Glucose-Capillary: 159 mg/dL — ABNORMAL HIGH (ref 70–99)
Glucose-Capillary: 173 mg/dL — ABNORMAL HIGH (ref 70–99)
Glucose-Capillary: 195 mg/dL — ABNORMAL HIGH (ref 70–99)
Glucose-Capillary: 213 mg/dL — ABNORMAL HIGH (ref 70–99)

## 2010-06-06 ENCOUNTER — Ambulatory Visit: Payer: Medicare HMO | Admitting: Cardiology

## 2010-06-12 ENCOUNTER — Ambulatory Visit (INDEPENDENT_AMBULATORY_CARE_PROVIDER_SITE_OTHER): Payer: Medicare HMO | Admitting: *Deleted

## 2010-06-12 ENCOUNTER — Encounter: Payer: Self-pay | Admitting: Cardiology

## 2010-06-12 ENCOUNTER — Ambulatory Visit (INDEPENDENT_AMBULATORY_CARE_PROVIDER_SITE_OTHER): Payer: Medicare HMO | Admitting: Cardiology

## 2010-06-12 DIAGNOSIS — E119 Type 2 diabetes mellitus without complications: Secondary | ICD-10-CM | POA: Insufficient documentation

## 2010-06-12 DIAGNOSIS — E785 Hyperlipidemia, unspecified: Secondary | ICD-10-CM

## 2010-06-12 DIAGNOSIS — M1990 Unspecified osteoarthritis, unspecified site: Secondary | ICD-10-CM

## 2010-06-12 DIAGNOSIS — I82409 Acute embolism and thrombosis of unspecified deep veins of unspecified lower extremity: Secondary | ICD-10-CM

## 2010-06-12 DIAGNOSIS — I10 Essential (primary) hypertension: Secondary | ICD-10-CM

## 2010-06-12 DIAGNOSIS — I739 Peripheral vascular disease, unspecified: Secondary | ICD-10-CM | POA: Insufficient documentation

## 2010-06-12 LAB — POCT INR: INR: 2.8

## 2010-06-12 NOTE — Assessment & Plan Note (Addendum)
Based upon Doppler study in office, patient has asymptomatic peripheral vascular disease.  With excellent pulses over both posterior tibials as well as the right dorsalis pedis, he hopefully will not develop claudication.  To prevent this, optimal control of vascular risk factors should be pursued.  I will be happy to reassess this nice gentleman at anytime in the future that Dr. Felecia Shelling deems appropriate.  I've also encouraged him to call for peripheral edema, leg pain or other cardiovascular symptoms, should they develop.

## 2010-06-12 NOTE — Patient Instructions (Signed)
Your physician recommends that you schedule a follow-up appointment in: AS NEEDED  

## 2010-06-12 NOTE — Progress Notes (Signed)
HPI : [name] returns to the office as scheduled for continued assessment and treatment of deep vein thrombosis.  Since his last visit, he has done beautifully.  He is completely recovered from his TKA surgery and is thrilled with the result.  He ambulates well without any pain.  He has noted no edema, chest discomfort or dyspnea.  Current Outpatient Prescriptions on File Prior to Visit  Medication Sig Dispense Refill  . Cholecalciferol (VITAMIN D3) 2000 UNITS TABS Take 1 tablet by mouth daily.        Marland Kitchen gabapentin (NEURONTIN) 600 MG tablet Take 600 mg by mouth 2 (two) times daily.        Marland Kitchen glyBURIDE-metformin (GLUCOVANCE) 5-500 MG per tablet Take 1 tablet by mouth daily with breakfast.        . Multiple Vitamins-Minerals (MULTIVITAMIN WITH MINERALS) tablet Take 1 tablet by mouth daily.        Marland Kitchen oxyCODONE-acetaminophen (PERCOCET) 5-325 MG per tablet Take 1 tablet by mouth every 4 (four) hours as needed.        . propranolol-hydrochlorothiazide (INDERIDE) 40-25 MG per tablet Take 1 tablet by mouth daily.        . simvastatin (ZOCOR) 40 MG tablet Take 40 mg by mouth at bedtime.        Marland Kitchen warfarin (COUMADIN) 5 MG tablet Take by mouth as directed.           No Known Allergies    Past medical history, social history, and family history reviewed and updated.  ROS: See history of present illness.  PHYSICAL EXAM: BP 160/88  Pulse 84  Ht 5\' 11"  (1.803 m)  Wt 216 lb (97.977 kg)  BMI 30.13 kg/m2  SpO2 97%  General-Well developed; no acute distress Body habitus-proportionate weight and height Neck-No JVD, no carotid bruits Lungs: clear lung fields; normal I:E ratio Cardiovascular-normal PMI; normal S1 and S2; modest systolic murmur at the lower left sternal border Abdomen-normal bowel sounds; soft and non-tender without masses or organomegaly Skin-Warm, no significant lesions Extremities-Nl distal pulses, except for the left dorsalis pedis, which is 1/2+ and monophasic on Doppler examination;  trace edema  ASSESSMENT AND PLAN:

## 2010-06-12 NOTE — Assessment & Plan Note (Signed)
In light of patient's diabetes, hyperlipidemia should be well controlled.  His current dose of simvastatin will likely be adequate for this purpose.  Testing will be at the discretion of patient's primary care physician, Dr. Felecia Shelling.

## 2010-06-12 NOTE — Assessment & Plan Note (Signed)
Patient has been treated with 6 months of full anticoagulation, which should be adequate for a provoked deep vein thrombosis, especially when limited to the infrapopliteal veins.  Warfarin will be discontinued.

## 2010-06-12 NOTE — Assessment & Plan Note (Signed)
Repeat blood pressure in the office was 150/80.  Both values are suboptimal; however, patient reports measurements of approximately 130/80 on most days at home.  He appears to have a component of whitecoat hypertension and probably does not require additional antihypertensive therapy.

## 2010-06-20 NOTE — Discharge Summary (Signed)
NAMEJOSHAWA, [name]               ACCOUNT NO.:  [id number]   MEDICAL RECORD NO.:  [id number]          PATIENT TYPE:  INP   LOCATION:  A328                          FACILITY:  [facility]   PHYSICIAN:  J. Darreld Mclean, M.D. 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   PROCEDURE PERFORMED:  Right total knee arthroplasty.   DISCHARGE STATUS:  Improved.   PROGNOSIS:  Good.   DISPOSITION:  Home.   DISCHARGE MEDICATIONS:  1. Simvastatin 40 mg daily.  2. Gabapentin 600 mg three times a day.  3. Actos 15 mg daily.  4. Propranolol/HCTZ 40/25 daily.  5. Magic Mouth Wash as needed three times a day.  6. Percocet 10/325.  I have given a prescription for the Percocet.  7. He is to resume his other medicines.   The patient to be seen in my office on March 1 at 9:15.   Physical therapy has been arranged as well.   Other medicine also given was enoxaparin 40 mg subcu daily for the next  2 weeks.  It is to be given by home health nurse.  Home health PT has  been arranged.  Home health nurse has been arranged.   The patient has had significant degenerative joint disease in his knee  for some time.  It has gotten progressively worse.  The patient was  admitted and underwent the above-mentioned procedure which he tolerated  well.  Pain was controlled.  He had a PCA pump with Dilaudid.  First day  was doing well, he was seen by Dr. Felecia Shelling.  His hemoglobin was good.  He  was seen by physical therapy the first day and had range of motion -12  to 45 and walked approximately 15-20 feet.  Had a problem during the  evening with his Dilaudid at that night and we cut back Dilaudid to a  regular dose.  He had had one and a half times normal best.  Potassium  slightly decreased when supplemented.  Hemoglobin was 10.8  on the second  postoperative day, third postoperative day his hemoglobin was stable.  His vital signs were normal.  Labs were normal.  He did very well in  therapy and was making very good  progress.  He had no further episodes with his pain medicine.  His labs  otherwise are normal.  He understands the instructions.  He understands  wound care.  I will see him in the office as stated.  Any difficulties,  contact me through office hospital beeper system.                                            ______________________________  Shela Commons. Darreld Mclean, M.D.     JWK/MEDQ  D:  03/26/2008  T:  03/26/2008  Job:  [id number] 

## 2010-06-20 NOTE — H&P (Signed)
[name], [name]               ACCOUNT NO.:  [id number]   MEDICAL RECORD NO.:  [id number]          PATIENT TYPE:  AMB   LOCATION:  DAY                           FACILITY:  [facility]   PHYSICIAN:  J. Darreld Mclean, M.D. DATE OF BIRTH:  Nov 24, 1940   DATE OF ADMISSION:  DATE OF DISCHARGE:  LH                              HISTORY & PHYSICAL   CHIEF COMPLAINT:  My knee hurts, it has got bad arthritis on it.   The patient is a 70 year old male complaints of pain and tenderness in  his right knee for several years.  I first saw him in 2007.  He was  having a varus deformity of the knee that has got progressively worse.  He has had pain in the medial aspect of the knee.  Overtime the varus  deformity has gotten worse, and he has as got other significant bow-  legged appearance.  He has pain in his knee.  Conservative treatment has  been unsuccessful over the years and his pain is getting worse and his  deformity is getting worse.  He is currently taking Lortab 10 for the  pain and Naprosyn.  Like to have a total knee arthroplasty.  I have  discussed with him on multiple occasions the risks and imponderables if  the procedure including infection, possible blood clots, need for  postoperative physical therapy, anesthesia risk.  I have told him about  antibiotics for surgery, about medicine to avoid the clots, and the need  for exercise program once he gets home.  I recommend consideration of a  spinal anesthesia.   He has seen Dr. Felecia Shelling, his family doctor, and has gotten medical  clearance from him for the procedure.   The patient has a history of hypertension and diabetes.  He denies heart  disease, lung disease, kidney disease, stroke, paralysis, TB, cancer,  polio, ulcer disease, circulatory problems.   He has no allergies.   The patient does not smoke and uses alcoholic beverages socially.  He  has had some GI problems and sees Dr. Jena Gauss for this.  He is status post  surgery on his  neck with a bone fusion years ago at [facility].   The patient is currently taking Lortab 10, Glucophage, Naprosyn.   The patient is not married, lives in [place].   PHYSICAL EXAMINATION:  VITAL SIGNS:  BP is 140/84, pulse 68, respiratory  rate 16, afebrile.  Height 5 feet 10 inches, weight 223.  GENERAL:  He is alert, cooperative, oriented.  HEENT:  Negative.  NECK:  Supple.  LUNGS:  Clear to P and A.  HEART:  Regular without murmur.  ABDOMEN:  Soft, nontender without masses.  EXTREMITIES:  He has got a marked genu varum and bow-leg appearance with  approximately 10-degree angulation.  This was on the right.  On the  left, he has got a slight genu varum with approximately 5-degree  angulation.  He has got marked crepitus, pain, and tenderness in the  right leg.  Other extremities are normal limits.  CNS:  Intact.  SKIN:  Intact.  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  History of hypertension, history of diabetes diet  controlled.   PLAN:  Right total knee arthroplasty.  Labs pending.                                             ______________________________  Shela Commons. Darreld Mclean, M.D.     JWK/MEDQ  D:  03/22/2008  T:  03/23/2008  Job:  [id number]

## 2010-06-20 NOTE — Op Note (Signed)
[name], [name]               ACCOUNT NO.:  [id number]   MEDICAL RECORD NO.:  [id number]          PATIENT TYPE:  INP   LOCATION:  A328                          FACILITY:  [facility]   PHYSICIAN:  J. Darreld Mclean, M.D. DATE OF BIRTH:  [birth year]   DATE OF PROCEDURE:  DATE OF DISCHARGE:                               OPERATIVE REPORT   PREOPERATIVE DIAGNOSIS:  Severe degenerative joint disease of the right  knee with marked genu varum deformity.   POSTOPERATIVE DIAGNOSIS:  Severe degenerative joint disease of the right  knee with marked genu varum deformity.   PROCEDURE:  Total knee arthroplasty on the right using a [employer]  size 6 Legion primary P/S femur, size 5 Genesis II tibial baseplate,  size 5/6 13-mm P/S high flexion insert, and a 29-mm resurfacing patella  button.  Methyl methacrylate was used.   ANESTHESIA:  Spinal.   SURGEON:  J. Darreld Mclean, MD   DRAIN:  One large Hemovac drain used.   INDICATIONS:  The patient is a 69 year old male with a several-year  history of an increasing pain and tenderness to his right knee.  He is  getting a significant bowed-leg appearance genu varum that has got  progressively worse.  Conservative treatment has not been successful.  I  talked to him on several occasions about the risks and imponderables of  the procedure for total knee arthroplasty.  He appeared to understand  and agreed for the procedure.  He has seen Dr. Felecia Shelling in preoperative  evaluation and found to be medical candidate.   DESCRIPTION OF THE PROCEDURE:  The patient was seen in the holding area.  He identified the right knee as the correct surgical site.  He placed a  mark on the right knee.  I placed a mark on the right knee.  He was  brought back to the operating room.  He was given spinal anesthesia.  He  was placed supine on the operating room table.  The tourniquet was  placed and deflated the left upper thigh.  Sandbag was placed under the  hip.   Appropriate instrumentation was present.  The patient was prepped  and draped in the usual manner.  At a time-out, the patient was  identified as [name], the right knee was identified as correct  surgical site.  The operative team knew each other, but introduced  themselves again.  There was a representative for [employer] in the  room also.  All instrumentation to be working properly and properly  positioned.  The leg was elevated, wrapped circumferentially with an  Esmarch bandage.  Tourniquet inflated to 300 mmHg.  Esmarch bandage  removed.  Midline incision was made.  Parapatellar incision was made.  Patella was everted.  The patient had some loose bodies that came out.  He had some fluid.  He had marked degenerative joint disease in all  compartments.  The knee was flexed.  At first, a drill hole was made  into the femur and the first gig was placed.  Cuts were made at 11 mm  for the  femur.  Tennis Racquet to the tibia.  Using an external guide, a  cut was made on the tibia at the lowest point, which is medially.  He  had marked changes medially secondary to his genu varum.  Again, it  should be noted there were multiple loose bodies in the knee that were  removed.  Tibial cut was made.  Attention directed back to the femur  where the femur was sized and it found to be a size 6.  Prior to going  to the femur, we sized the tibia and it was a size 5.  Block from the  size 6, cuts were made, anterior, posterior, anterior chamfer, and  posterior chamfer.  The trial femur gig was applied and then the central  cuts were made in the box with the appropriate instrumentation.  Good  fit was obtained.  Attention was directed back to the tibia, I did some  sizing and marking and a 13 wafer for the #5 tibia fit well, looked  well, and the tibia was marked at this location.  A tibial cutting block  was then inserted and the punch was made into the tibia for the  prosthesis.  Attention  directed to the patella, patella was measured at  25 mm, and we made the cuts to leave 16 mm on the patella.  Patella was  small and cuts were made for the 29 button.  Trial reduction was carried  out with all components in place to fit nicely and the knee was stable.   Assistant used [employer], cleansed the knee.  It should be noted prior  to this, there was a large posterior loose body that was removed.  After  the initial cut on the tibia, there was very large loose body and other  multiple small loose bodies were removed.  Methyl methacrylate was mixed  under a vacuum, placed in a caulk gun, tibia, and methyl methacrylate  applied first and the prosthesis was inserted, then the femoral  prosthesis was inserted with the methyl methacrylate.  A tibial wafer  was then inserted, had a good fit.  Attention then directed to the  patellar button.  The patellar button was then applied and had the clamp  attached to it.  All components fit well and they had good tracking of  the patella.  Hemovac drain was placed, cut, and then the wound  reapproximated using #1 Surgilon suture interrupted figure-of-eight.  Permanent pictures were taken after the tourniquet have been deflated  after 73 minutes.  X-rays looked good in AP and lateral view.  There was  no evidence of any retained loose bodies within the joint.  Hemovac was  put to suction.  Subcutaneous tissue approximated using a 2-0 plain and  the skin reapproximated using skin staples.  The patient tolerated the  procedure well.  Bulky dressing was applied.  Cryo/Cuff applied.  A CPM  machine will be applied in the recovery area.  He will be on a PCA pump.  Consultation has been requested with Dr. Felecia Shelling.  The patient tolerated  the procedure well.  His sponge and needle count were correct.  Vital  signs were stable.  A good color returned and good pulses at the end of  the procedure.           ______________________________  J. Darreld Mclean, M.D.     JWK/MEDQ  D:  03/23/2008  T:  03/23/2008  Job:  [id number]

## 2010-06-23 NOTE — H&P (Signed)
[facility]  Patient:    [name], [name]                      MRN: [id number] Adm. Date:  [id number] Attending:  Thyra Breed             Dr. Aquilla Hacker, [facility]   History and Physical  HISTORY OF PRESENT ILLNESS:  [name] is a 70 year old gentleman who was sent to [place] by Dr. Zada Girt of the specialty clinic at [facility].  The patient was diagnosed by Dr. Zada Girt in August 2000 with peripheral neuropathy.  He apparently was referred immediately at that time.  The patient has an incredibly poor recollection of what previous treatments he has received, but he describes a burning dysesthesia of his feet and hands which he dated from an injury which occurred while working in 1994.  At that time, he injured his back and neck.  He was evaluated by Dr. Gayland Curry at [facility] and underwent a surgical intervention.  The patient stated this was in the neck.  It did not help reduce any of his discomfort.  He was on workmans compensation for about four years and stated he saw several physicians.  He was treated by Dr. Parke Simmers in [place] at the time.  He stated he was tried on multiple medications, the names of which he cannot recall. Eventually he was seen by Dr. Zada Girt on September 12, 1998.  At that time, he was diagnosed as having a painful peripheral sensory neuropathy although his nerve conduction studies did not demonstrate abnormalities.  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  He was also known to have borderline bilateral carpal tunnel syndrome.  Since then, the patient vaguely recalls being treated with Lorcet which he states helps him, and eventually being placed on OxyContin which initially he stated was not helpful, but later stated was helpful.  He has also been treated with amitriptyline which he stated made him feel too drugged.  He does not remember being on Neurontin, but stated he was placed on medications that he  could not afford.  He is on a very limited income.  He describes his pain as a stabbing discomfort in his back and legs, and throbbing in the arms and forearms.  It is made worse by mopping, squatting and riding in a car.  His knees are especially bothered by squatting.  He describes a numbness and tingling in his hands, especially in the fourth and fifth digits of the left upper extremity which come and go, although we could not identify exacerbating or relieving factors.  He has intermittent burning dysesthesias of his feet.  He stated his back discomfort equipped his peripheral numbness and tingling discomforts.  He denied bowel or bladder incontinence or weakness.  He is accompanied by a note from Dr. Zada Girt and laboratory investigations which basically show a hemoglobin A1c of 7.7, normal TSH and what appears to be normal serum protein electrophoresis.  I do not have the results of his other laboratory investigations.  There is another note which states that the patient has been on Relafen, Lortab and amitriptyline.  There is a normal awake EEG and a borderline bilateral carpal tunnel interpretation on upper extremity nerve conduction studies with normal lower extremity nerve conduction studies.  There is another note that eludes to the fact that he was on Tegretol at one point.  This note was dated from November 03, 1998.  CURRENT MEDICATIONS:  Currently, the patient takes alprazolam, Celexa, propanol, hydrochlorothiazide, Prilosec, Vioxx and OxyContin 10 mg twice a day.  ALLERGIES:  He denied allergies.  FAMILY HISTORY:  He has a positive family history for COPD and lung cancer.  PAST SURGICAL HISTORY:  Significant for his neck fusion, otherwise, negative.  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  He does not smoke.  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  His first wife died of cancer in 73.  ACTIVE  MEDICAL PROBLEMS:  Depression, hypertension, syncopal episode which is unclear to me, and his peripheral neuropathy as well as his lower back injuries.  REVIEW OF SYSTEMS:  The patient denied positives for general, head, eyes, nose, mouth, throat, ears, pulmonary, cutaneous, hematologic, endocrine.  He does give a history of what sounds like gastroesophageal reflux disease on review of GI.  He gives a positive history of renal calculi on GU. MUSCULOSKELETAL AND NEUROLOGIC:  See HPI.  PSYCHIATRIC:  He has a history of depression.  ALLERGY/NEUROLOGIC:  He does have recurrent sinus problems.  PHYSICAL EXAMINATION:  VITAL SIGNS:  Blood pressure 141/82, heart rate 69, respiratory rate 14, O2 saturation 95%.  Pain level:  The patient rated at 10/10, although he did not look to be in that severe of discomfort.  Temperature 97.2.  HEENT:  Head:  Normocephalic, atraumatic.  Eyes:  Extraocular movements intact with conjunctivae and sclerae clear.  Nose is patent.  Nares patent. Oropharynx is free of lesions.  NECK:  Demonstrated minimal reduction in range of motion with carotids 2+ and symmetric without bruits.  LUNGS:  Clear.  HEART:  Regular rate and rhythm.  ABDOMEN:  Bowel sounds present.  Soft without appreciable masses.  GENITALIA/RECTAL:  Exams were not performed.  EXTREMITIES:  Significant for solar keratoses over the forearms.  Radial pulses and dorsalis pedis pulses were 1 to 2+ and symmetric.  He had good range of motion of his joints.  NEUROLOGIC:  The patient was oriented x4.  Cranial nerves II-XII were grossly intact.  Deep tendon reflexes were 1+ and symmetric in the upper and lower extremities with downgoing plantar responses.  Motor was 5/5 with symmetric bulk and tone.  Sensory was significant for intact vibratory sense with attenuated scratch to just above the ankles bilaterally and to the upper forearms bilaterally.  The patient was able to heel walk and toe walk  without difficulty.  Coordination was grossly intact.   LABORATORY DATA:  I was able to obtain an old MRI of his back which was dated from Jun 23, 1998, although he stated he had one done two weeks ago.  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  IMPRESSION: 1. Low back pain which is likely on the basis of his facet joint arthropathy. 2. Peripheral neuropathy diagnosed by Dr. Zada Girt. 3. Other medical problems per Dr. Felecia Shelling including depression, hypertension,    gastroesophageal reflux disease, history of renal calculi, history of    recurrent sinus problems and history of osteoarthritis of the hands.  DISPOSITION: 1. I advised the patient that I really had very limited outside medical    records and advised him that we needed to obtain medical records from    Dr. Felecia Shelling. 2. I  advised him that it appeared that Dr. Felecia Shelling had him on appropriate    doses of medications for the time being and that we would like need to try    a facet joint block at L5-S1 and L4-5 and recommended that we proceed with    this at his next visit.  I reviewed the potential risks, side effects and    benefits of this as well as the limitations.  He seems to express a good    understanding of this. 3. I did give him a prescription for ketoprofen/lidocaine/capsaicin in    PLO-10/10/0.025% 30 g tube if he can afford to get this.  If he cannot    afford it, I recommended he get Thera-Gesic. 4. We will see him in followup in the ensuing weeks to proceed with a trial of    facet joint nerve blocks.  If these are not responsive, then I would    encourage him to continue on his current medical management by Dr. Felecia Shelling.    I advised the patient that I did not see the need for both of [place] to be    writing opiates and  since he has already been started on these by    Dr. Felecia Shelling that I would not write these for him, but defer to Dr. Felecia Shelling    who has initiated these. DD:  09/13/99 TD:  09/13/99 Job: [id number]

## 2010-06-23 NOTE — Procedures (Signed)
[facility]  Patient:    [name], [name]                      MRN: [id number] Proc. Date: 10/19/99 Adm. Date:  [id number] Attending:  Thyra Breed CC:         Dr. 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  PROCEDURE:  Facet joint nerve blocks at L3-4, 4-5, and L5-S1 on the left side.  SURGEON:  Thyra Breed, M.D.  INTERVAL HISTORY:  The patient notes that the right side of his lower back is markedly improved.  He is having some ongoing discomfort on the left side.  He complains bitterly of his feet where he has been previously diagnosed as having plantar fasciitis.  He has tried to wrap his feet, tried braces, and currently has started him with a new arch support.  The pain is predominantly over the mid part of the plantar fascia.  He did not notice any benefit from medical interventions for this.  PHYSICAL EXAMINATION:  Blood pressure 134/99, heart rate 75, respiratory rate 11, and O2 saturations 98%.  Pain level is 7 out of 10.  Temperature is 96.8. His neurologic exam is grossly unchanged with negative straight leg raise signs and tenderness over the lower lumbar facet joints, left being greater than right now.  His feet showed tenderness over the mid plantar fascia bilaterally to only mild pressure.  DESCRIPTION OF PROCEDURE:  After informed consent was obtained, the patient was taken to the fluoroscopy suite where he was placed in the prone position with a pillow under his abdomen and monitored.  Using fluoroscopic guidance, I identified the lumbar vertebrae, L1 through L5, and marked the spinous processes.  The junction of the superior articulating process and the transverse process at L3-4 and 4-5 were identified.  The  sacral cornu of L5-S1 was identified and marked.  The skin was prepped with Betadine x 3 and draped.  Using the 25-gauge needle, I anesthetized each site with 2 cc of 1% lidocaine.  A 25-gauge spinal needle was introduced down to the junction of the superior articulating process and transverse process at L3-4 and 4-5, and at the sacral cornu at L5-S1.  Contact with bone was made and the needle retracted a millimeter to 2 mm.  Oblique projections confirmed placement. Lateral projection confirmed that the needles were not in the neuroforamen. Aspiration was negative.  A 0.5 cc of 1% lidocaine was injected at each site. Thirty seconds later, there was no evidence of spinal or nerve root block. One milliliter of 1% lidocaine with 13 ml of Medrol was injected at each level.  The needles were flushed with a 0.5 cc of 1% lidocaine and removed intact.  POSTPROCEDURE CONDITION:  The patient tolerated the procedure well with some marked reduction in his discomfort.  I plan to see the patient back in four weeks to ascertain whether he is having recurrence of his pain to a significant degree as previously.  In the interim, I advised him that he needs to gradually build up time wearing his new arch supports, and is wearing them throughout the day.  Will likely lead to exacerbations in his foot discomfort initially.  If he is not improving with this course will action, will consider either plantar fascia injection which I advised him  carries the risk of weakening the plantar fascia or further mechanical interventions.  He is to continue on his current medications. DD:  10/19/99 TD:  10/20/99 Job: [id number]

## 2010-06-23 NOTE — Op Note (Signed)
[facility]  Patient:    [name], [name] Visit Number: [id number] MRN: [id number]          Service Type: END Location: DAY Attending Physician:  [name] Dictated by:   [name], M.D. Proc. Date: 06/18/01 Admit Date:  06/18/2001                             Operative Report  PROCEDURE: Colonoscopy and snare polypectomy.  INDICATIONS FOR PROCEDURE:  The patient is a 70 year old gentleman, devoid of any GI symptoms, referred by [name] for colorectal cancer screening. He has never had his lower GI tract imaged.  Family history is negative for colorectal neoplasia, although his 54-year-old son was reported to have some polyps recently removed from his colon.  Colonoscopy is now being done as a standard screening maneuver.  This approach has been discussed with [name]. The potential risks, benefits, and alternatives have been reviewed and questions answered.  Please see my handwritten H and P for more information. He is at low risk for conscious sedation with Versed and fentanyl.  PROCEDURE NOTE:  O2 saturation, blood pressure, pulse, and respirations were monitored throughout the entire procedure.  Conscious sedation, fentanyl 150 mcg and Versed 6 mg IV in divided doses.  INSTRUMENT:  Olympus video chip colonoscope.  FINDINGS:  Digital rectal examination revealed no abnormalities.  ENDOSCOPIC FINDINGS:  Prep was good.  RECTUM:  The rectal mucosa including retroflexed view of the anal verge revealed a couple of anal papilla; otherwise, the rectal mucosa appeared normal.  COLON:  The colonic mucosa was surveyed from the rectosigmoid junction through the left transverse and right colon to the area of the appendiceal orifice, ileocecal valve, and cecum. These structures were well-seen and photographed for the record. The patient was noted to have a left-sided sigmoid diverticula from the level of the cecum and ileocecal valve.  The  scope was slowly withdrawn.  All previously mentioned mucosal surfaces were again seen.  The only other abnormality of note was a 5 mm polyp on a stalk at 25 cm with an adjacent 3 mm diminutive polyp.  The larger polyp was removed with snare cautery, and the smaller polyp was distorted with the tip of the snare. The remainder of the colonic mucosa appeared normal.  The patient tolerated the procedure well and was reacted in endoscopy.  IMPRESSION: 1. Anal papilla; otherwise normal rectum. 2. Rectosigmoid polyps, treated as described above. 3. Left-sided diverticula. 4. The remainder of the colonic mucosa appeared normal.  RECOMMENDATIONS: 1. No aspirin or arthritis medications for ten days. 2. Followup on pathology. 3. Diverticulosis literature. 4. Further recommendations to follow. Dictated by:   [name], M.D. Attending Physician:  [name] DD:  06/18/01 TD:  06/19/01 Job: [id number]

## 2010-06-23 NOTE — Procedures (Signed)
[employer] [facility]  Patient:    [name], [name]                      MRN: [id number] Proc. Date: 11/23/99 Adm. Date:  [id number] Attending:  Thyra Breed CC:         Dr. Avon Gully, [place]   Procedure Report  PROCEDURE:  Right-sided L3-4, L4-5, L5-S1, and L2-3 facet joint nerve blocks.  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  INTERVAL HISTORY:  The patient notes that the left side of his back is now pretty much improved to where it is not bothering him much, and the right side is bothering him.  He continues to have pain in his left foot in the arch of the foot.  He tells me that Dr. Felecia Shelling is a little bit concerned about continuing him on the OxyContin if I have not written with regard to approval of this, and I advised Donnie that if he was not on the OxyContin, he probably would be in much worse shape, and there is certainly no problem with taking 10 mg twice a day of this with his underlying back problems.  These blocks will not necessarily make him able to go off those but should make things more tolerable to him.  PHYSICAL EXAMINATION:  VITAL SIGNS:  Blood pressure 134/87, heart rate 59, respiratory rate 8, O2 saturation 97%, pain level is 8 out of 10.  MUSCULOSKELETAL/NEUROLOGIC:  The patient exhibits tenderness over the right facet joint at L2-3, 3-4, 4-5, and 5-S1.  The left side is relatively asymptomatic.  He also exhibits tenderness over the arch of the left foot.  DESCRIPTION OF PROCEDURE:  After informed consent was obtained, the patient was taken to the fluoroscopy suite, where he was placed in a prone position with a pillow under his abdomen and monitored.  His back was assessed with the fluoroscope, and lumbar vertebrae and spinous processes at L1 to L5 were identified and marked.  The junction of the transverse process with the superior articulating process was marked at L2-3,  3-4, 4-5, and the sacral cornu at L5-S1.  Skin was prepped with Betadine x 3 and scrubbed with alcohol.  Lidocaine 1% was used to anesthetize each level with 1.5 cc using a 25-gauge needle.  Spinal needles 25 gauge were introduced down to bony contact at the junction of the superior articulating process and the transverse process at each level.  Aspiration was negative.  I injected 0.5 cc of 1% lidocaine. There was no evidence of spinal or nerve root injection.  Fluoroscopic visualization of the lateral and oblique projections confirmed that we were not intraforaminal and posterior to the neural foramen.  Medrol 20 mg in 1 cc of 1% lidocaine was injected at each level.  The needles were flushed with 1% lidocaine and removed intact.  POSTPROCEDURE CONDITION:  Stable.  DISCHARGE INSTRUCTIONS: 1. Resume previous diet. 2. Limitation of activities per instruction sheet as outlined by my assistant    today. 3. The patient was encouraged to continue his current medications, including    the OxyContin. 4. I encouraged the patient to try using running shoes to get a better arch    support for his foot.  I advised him that this may allow him less strain on    the arch. 5. Follow up with me in four weeks. DD:  11/23/99 TD:  11/23/99 Job: [id number]

## 2010-06-23 NOTE — Procedures (Signed)
[facility]  Patient:    [name], [name]                      MRN: [id number] Proc. Date: 10/03/99 Adm. Date:  [id number] Attending:  Thyra Breed CC:         Dr. Aquilla Hacker, [name].   Procedure Report  PROCEDURE:  Facet joint nerve block at 3-4, 4-5 and 5-S1 on the right side.  DIAGNOSIS:  Lumbar spondylosis, spondylolisthesis and degenerative disk disease.  INTERVAL HISTORY:  Since his last visit, the patient has given the thought of the injection some deeper concerns and is interested in proceeding with injections today in the facet joint region. Since his last visit, we have gotten a copy of his MRI from September 06, 1999 which demonstrated grade 1 spondylolisthesis at L5-S1 with facet joint arthropathy at 4-5 and 5-S1 with degenerative disk bulge at L5-S1. He complains of low back pain, right slightly greater than left which does radiate past the knees into both feet. He has burning dysesthesia of his feet but in reviewing his old medical records, he does have a history of an elevated hemoglobin A1C at 1.0 to a mild extent and this may be accounting for some of the burning dysesthesia of his feet.  PHYSICAL EXAMINATION:  VITAL SIGNS:  Blood pressure 147/92, heart rate 64, respiratory rate 16, O2 saturations 96% and temperature is 97, pain level is 9/10.  NEUROLOGIC:  Grossly unchanged from previously. His deep tendon reflexes were 1+ and symmetric in the lower extremity with negative straight leg raise signs. He does exhibit tenderness over the facet joint regions bilaterally with the right being greater than the left.  DESCRIPTION OF PROCEDURE:  After informed consent was obtained and after reviewing the potential side effects of corticosteroids, the patient elected to go ahead and proceed with the procedure. We took him back to the fluoro suite where he was placed in the prone position and monitored. Using fluoroscopic guidance, I  identified the facet joints at L3-4, 4-5 and 5-S1 on the right side. I made a mark on the skin overlying the junction of the superior articulating process with the transverse process at each of these levels. The skin was prepped with Betadine x 3 and draped. Each level was anesthetized using a 25 gauge needle with 2 cc of 1% lidocaine. Twenty-five gauge spinal needles were placed at the junction of the superior articulating process with the transverse process at L3-4 and 4-5. At the sacral cornu, a needle was placed. This was confirmed by oblique, AP and lateral projections. Aspiration was negative for blood and CSF. A 0.5 cc of 1% lidocaine was injected at each level. At 30 seconds later, there was no evidence of a spinal. Then 13 mg of Medrol and 1 cc of 1% lidocaine was injected at each level. The needles were flushed with 1% lidocaine and removed intact.  CONDITION POST PROCEDURE:  Twenty minutes after the procedure, the patient noted marked resolution of the pain on the right side of his lower back. He continued to have some discomfort in his foot but there was less pain out into his leg. He rated it at 5/10 but it was difficult to ascertain whether he was summating the pain on his left side with his right side. I advised him that we needed to repeat the block to find out if we could get better control of his pain before considering any radiofrequency lesioning of the medial  branch nerves to the facet joints. The patient understands this and plans to follow-up in a couple of weeks. He is to continue on his current medications. He plans to follow-up with Dr. Felecia Shelling in the meantime.  DISPOSITION:  Please see above. DD:  10/03/99 TD:  10/03/99 Job: [id number]

## 2010-07-10 ENCOUNTER — Encounter: Payer: Medicare HMO | Admitting: *Deleted

## 2010-07-12 ENCOUNTER — Encounter: Payer: Medicare HMO | Admitting: *Deleted

## 2010-12-14 ENCOUNTER — Encounter: Payer: Self-pay | Admitting: Cardiology

## 2010-12-14 ENCOUNTER — Telehealth: Payer: Self-pay | Admitting: Cardiology

## 2010-12-14 NOTE — Telephone Encounter (Signed)
Called patient and left message.  Sending letter as well.  Patient needs to schedule coumadin.

## 2011-01-08 ENCOUNTER — Telehealth: Payer: Self-pay | Admitting: *Deleted

## 2011-01-08 ENCOUNTER — Ambulatory Visit: Payer: Self-pay | Admitting: *Deleted

## 2011-01-08 DIAGNOSIS — I82409 Acute embolism and thrombosis of unspecified deep veins of unspecified lower extremity: Secondary | ICD-10-CM

## 2011-01-08 NOTE — Telephone Encounter (Signed)
Patient states that he was taken off of Coumadin about 5 mths ago. / tg

## 2011-06-11 ENCOUNTER — Telehealth: Payer: Self-pay | Admitting: Gastroenterology

## 2011-06-11 NOTE — Telephone Encounter (Signed)
Please call patient back at [phone] He received a letter to set up his colonoscopy

## 2011-06-12 ENCOUNTER — Telehealth: Payer: Self-pay

## 2011-06-12 NOTE — Telephone Encounter (Signed)
Day of prep: glucovance 1/2 tablet at BF. Per Tyler Aas, patient no longer on coumadin.

## 2011-06-12 NOTE — Telephone Encounter (Signed)
Gastroenterology Pre-Procedure Form  PT SAID HE HAS BEEN OFF COUMADIN ABOUT A YEAR. HE WAS PUT ON IT WHEN HE HAD A BLOOD CLOT AFTER SOME KNEE SURGERY!   Request Date: 06/11/2011      Requesting Physician: Pt received a letter that it was time to have next colonoscopy (last was 06/18/2001 by [facility])     PATIENT INFORMATION:  [name] is a 71 y.o., male (DOB=[birth year]).  PROCEDURE: Procedure(s) requested: colonoscopy Procedure Reason: screening for colon cancer  PATIENT REVIEW QUESTIONS: The patient reports the following:   1. Diabetes Melitis: yes  2. Joint replacements in the past 12 months: no 3. Major health problems in the past 3 months: no 4. Has an artificial valve or MVP:no 5. Has been advised in past to take antibiotics in advance of a procedure like teeth cleaning: no}    MEDICATIONS & ALLERGIES:    Patient reports the following regarding taking any blood thinners:   Plavix? no Aspirin?yes  Coumadin?  no  Patient confirms/reports the following medications:  Current Outpatient Prescriptions  Medication Sig Dispense Refill  . Cholecalciferol (VITAMIN D3) 2000 UNITS TABS Take 1 tablet by mouth daily.        Marland Kitchen gabapentin (NEURONTIN) 600 MG tablet Take 600 mg by mouth 2 (two) times daily.        Marland Kitchen glyBURIDE-metformin (GLUCOVANCE) 5-500 MG per tablet Take 1 tablet by mouth daily with breakfast.        . Multiple Vitamins-Minerals (MULTIVITAMIN WITH MINERALS) tablet Take 1 tablet by mouth daily.        Marland Kitchen oxyCODONE-acetaminophen (PERCOCET) 5-325 MG per tablet Take 1 tablet by mouth every 4 (four) hours as needed.        . propranolol-hydrochlorothiazide (INDERIDE) 40-25 MG per tablet Take 1 tablet by mouth daily.        . simvastatin (ZOCOR) 40 MG tablet Take 40 mg by mouth at bedtime.          Patient confirms/reports the following allergies:  No Known Allergies  Patient is appropriate to schedule for requested procedure(s): yes  AUTHORIZATION INFORMATION Primary  Insurance:   ID #:   Group #:  Pre-Cert / Auth required:  Pre-Cert / Auth #:   Secondary Insurance:   ID #:  Group #:  Pre-Cert / Auth required:  Pre-Cert / Auth #:   No orders of the defined types were placed in this encounter.    SCHEDULE INFORMATION: Procedure has been scheduled as follows:  Date:           Time:   Location:   This Gastroenterology Pre-Precedure Form is being routed to the following provider(s) for review: R. Roetta Sessions, MD

## 2011-06-12 NOTE — Telephone Encounter (Addendum)
Gastroenterology Pre-Procedure Form   Pt said he has been off coumadin about a year. He was put on it when he had a blood clot after he had some knee surgery.  Request Date: 06/11/2011         Requesting Physician: ( pt received letter that it was time to have next colonoscopy.. Last was 06/18/2001)    PATIENT INFORMATION:  [name] is a 71 y.o., male (DOB=July 31, 1940).  PROCEDURE: Procedure(s) requested: colonoscopy Procedure Reason: screening for colon cancer  PATIENT REVIEW QUESTIONS: The patient reports the following:   1. Diabetes Melitis: yes  2. Joint replacements in the past 12 months: no 3. Major health problems in the past 3 months: no 4. Has an artificial valve or MVP:no 5. Has been advised in past to take antibiotics in advance of a procedure like teeth cleaning: no}    MEDICATIONS & ALLERGIES:    Patient reports the following regarding taking any blood thinners:   Plavix? no Aspirin?no Coumadin?  no  Patient confirms/reports the following medications:    Patient confirms/reports the following allergies:  No Known Allergies   Patient is appropriate to schedule for requested procedure(s): yes  AUTHORIZATION INFORMATION Primary Insurance:   ID #:   Group #:  Pre-Cert / Auth required: Pre-Cert / Auth #:   Secondary Insurance:  ID #:   Group #:  Pre-Cert / Auth required:  Pre-Cert / Auth #:

## 2011-06-15 NOTE — Telephone Encounter (Signed)
Day of prep: glucovance 1/2 tabl at BF.

## 2011-06-27 ENCOUNTER — Telehealth: Payer: Self-pay

## 2011-06-27 NOTE — Telephone Encounter (Signed)
Called pt to see if he is ready to schedule his colonoscopy. He will call back. He has to check with someone who would drive him.

## 2011-07-23 ENCOUNTER — Telehealth: Payer: Self-pay | Admitting: Gastroenterology

## 2011-07-23 NOTE — Telephone Encounter (Signed)
Called, many rings and no answer.

## 2011-07-23 NOTE — Telephone Encounter (Signed)
Patient called to speak with DS to set up his TCS. Please call him back at [phone]

## 2011-07-25 ENCOUNTER — Other Ambulatory Visit: Payer: Self-pay

## 2011-07-25 DIAGNOSIS — Z139 Encounter for screening, unspecified: Secondary | ICD-10-CM

## 2011-07-25 NOTE — Telephone Encounter (Signed)
OK to schedule for time and date selected. Day of prep: hold glipizide. Take 1/2 dose of metformin.

## 2011-07-25 NOTE — Telephone Encounter (Signed)
Gastroenterology Pre-Procedure Form  Pt said he is borderline diabetic. He takes Metformin 500mg  every AM and Glipizide 5mg  every AM. BS run between 80-120 most of the time.  He is scheduled for 08/15/2011 @ 11:15. This is only week he could come in. Please advise if this is OK   Request Date: 07/25/2011     Requesting Physician: Recall list for [facility]     PATIENT INFORMATION:  [name] is a 71 y.o., male (DOB=[birth year]).  PROCEDURE: Procedure(s) requested: colonoscopy Procedure Reason: screening for colon cancer  PATIENT REVIEW QUESTIONS: The patient reports the following:   1. Diabetes Melitis: yes  2. Joint replacements in the past 12 months: no 3. Major health problems in the past 3 months: no 4. Has an artificial valve or MVP:no 5. Has been advised in past to take antibiotics in advance of a procedure like teeth cleaning: no}    MEDICATIONS & ALLERGIES:    Patient reports the following regarding taking any blood thinners:   Plavix? no Aspirin?no Coumadin?  no  Patient confirms/reports the following medications:  Current Outpatient Prescriptions  Medication Sig Dispense Refill  . Cholecalciferol (VITAMIN D3) 2000 UNITS TABS Take 1 tablet by mouth daily.        Marland Kitchen gabapentin (NEURONTIN) 600 MG tablet Take 600 mg by mouth 2 (two) times daily.        Marland Kitchen glipiZIDE (GLUCOTROL) 5 MG tablet Take 5 mg by mouth once. Pt takes one tablet in the AM      . metFORMIN (GLUCOPHAGE) 500 MG tablet Take 500 mg by mouth once. Takes one tablet in the AM      . Multiple Vitamins-Minerals (MULTIVITAMIN WITH MINERALS) tablet Take 1 tablet by mouth daily.        . propranolol-hydrochlorothiazide (INDERIDE) 40-25 MG per tablet Take 1 tablet by mouth daily.        . simvastatin (ZOCOR) 40 MG tablet Take 40 mg by mouth at bedtime.        Marland Kitchen glyBURIDE-metformin (GLUCOVANCE) 5-500 MG per tablet Take 1 tablet by mouth daily with breakfast.        . oxyCODONE-acetaminophen (PERCOCET) 5-325 MG per tablet  Take 1 tablet by mouth every 4 (four) hours as needed.          Patient confirms/reports the following allergies:  No Known Allergies  Patient is appropriate to schedule for requested procedure(s): yes  AUTHORIZATION INFORMATION Primary Insurance:   ID #:   Group #:  Pre-Cert / Auth required: Pre-Cert / Auth #:   Secondary Insurance:   ID #:   Group #:  Pre-Cert / Auth required:  Pre-Cert / Auth #:   No orders of the defined types were placed in this encounter.    SCHEDULE INFORMATION: Procedure has been scheduled as follows:  Date: 08/15/2011         Time: 11:15 AM  Location: [facility] Short Stay  This Gastroenterology Pre-Precedure Form is being routed to the following provider(s) for review: R. Roetta Sessions, MD

## 2011-07-26 ENCOUNTER — Telehealth: Payer: Self-pay

## 2011-07-26 MED ORDER — PEG 3350-KCL-NA BICARB-NACL 420 G PO SOLR: ORAL | Status: AC

## 2011-07-26 NOTE — Telephone Encounter (Signed)
Patient not taking glucovance. Day of prep: hold glipizide. 1/2 dose metformin.

## 2011-07-26 NOTE — Telephone Encounter (Signed)
Rx and instructions mailed to pt.  

## 2011-07-26 NOTE — Telephone Encounter (Signed)
See triage

## 2011-07-26 NOTE — Telephone Encounter (Signed)
Gastroenterology Pre-Procedure Form    Request Date: 07/23/2011     Requesting Physician: Pt received letter time to schedule     PATIENT INFORMATION:  [name] is a 71 y.o., male (DOB=10/13/1940).  PROCEDURE: Procedure(s) requested: colonoscopy Procedure Reason: screening for colon cancer  PATIENT REVIEW QUESTIONS: The patient reports the following:   1. Diabetes Melitis: yes  2. Joint replacements in the past 12 months: no 3. Major health problems in the past 3 months: no 4. Has an artificial valve or MVP:no 5. Has been advised in past to take antibiotics in advance of a procedure like teeth cleaning: no}    MEDICATIONS & ALLERGIES:    Patient reports the following regarding taking any blood thinners:   Plavix? no Aspirin?no Coumadin?  no  Patient confirms/reports the following medications:  Current Outpatient Prescriptions  Medication Sig Dispense Refill  . Cholecalciferol (VITAMIN D3) 2000 UNITS TABS Take 1 tablet by mouth daily.        Marland Kitchen gabapentin (NEURONTIN) 600 MG tablet Take 600 mg by mouth 2 (two) times daily.        Marland Kitchen glipiZIDE (GLUCOTROL) 5 MG tablet Take 5 mg by mouth once. Pt takes one tablet in the AM      . metFORMIN (GLUCOPHAGE) 500 MG tablet Take 500 mg by mouth once. Takes one tablet in the AM      . Multiple Vitamins-Minerals (MULTIVITAMIN WITH MINERALS) tablet Take 1 tablet by mouth daily.        Marland Kitchen oxyCODONE-acetaminophen (PERCOCET) 5-325 MG per tablet Take 1 tablet by mouth every 4 (four) hours as needed.        . propranolol-hydrochlorothiazide (INDERIDE) 40-25 MG per tablet Take 1 tablet by mouth daily.        . simvastatin (ZOCOR) 40 MG tablet Take 40 mg by mouth at bedtime.        Marland Kitchen glyBURIDE-metformin (GLUCOVANCE) 5-500 MG per tablet Take 1 tablet by mouth daily with breakfast.        . polyethylene glycol-electrolytes (TRILYTE) 420 G solution Use as directed Also buy 1 fleet enema & 4 dulcolax tablets to use as directed  4000 mL  0     Patient confirms/reports the following allergies:  No Known Allergies  Patient is appropriate to schedule for requested procedure(s): yes  AUTHORIZATION INFORMATION Primary Insurance:   ID #:  Group #:  Pre-Cert / Auth required: Pre-Cert / Auth #:   Secondary Insurance:   ID #:  Group #:  Pre-Cert / Auth required: Pre-Cert / Auth #:   No orders of the defined types were placed in this encounter.    SCHEDULE INFORMATION: Procedure has been scheduled as follows:  Date: 08/15/2011     Time: 11:15 AM  Location: [facility] Short Stay  This Gastroenterology Pre-Precedure Form is being routed to the following provider(s) for review: R. Roetta Sessions, MD

## 2011-07-26 NOTE — Telephone Encounter (Signed)
Pt was triaged on 07/23/2011 and is scheduled for colonoscopy on 08/15/2011.

## 2011-08-07 ENCOUNTER — Encounter (HOSPITAL_COMMUNITY): Payer: Self-pay | Admitting: Pharmacy Technician

## 2011-08-14 MED ORDER — SODIUM CHLORIDE 0.45 % IV SOLN
Freq: Once | INTRAVENOUS | Status: AC
  Administered 2011-08-15: 1000 mL via INTRAVENOUS

## 2011-08-15 ENCOUNTER — Encounter (HOSPITAL_COMMUNITY)
Admission: RE | Disposition: A | Discharge status: Home / Self Care | Payer: Self-pay | Source: Ambulatory Visit | Attending: Internal Medicine

## 2011-08-15 ENCOUNTER — Encounter (HOSPITAL_COMMUNITY): Payer: Self-pay

## 2011-08-15 ENCOUNTER — Ambulatory Visit (HOSPITAL_COMMUNITY)
Admission: RE | Admit: 2011-08-15 | Discharge: 2011-08-15 | Disposition: A | Discharge status: Home / Self Care | Payer: Medicare HMO | Source: Ambulatory Visit | Attending: Internal Medicine | Admitting: Internal Medicine

## 2011-08-15 DIAGNOSIS — Z139 Encounter for screening, unspecified: Secondary | ICD-10-CM

## 2011-08-15 DIAGNOSIS — Z1211 Encounter for screening for malignant neoplasm of colon: Secondary | ICD-10-CM | POA: Insufficient documentation

## 2011-08-15 DIAGNOSIS — K5730 Diverticulosis of large intestine without perforation or abscess without bleeding: Secondary | ICD-10-CM

## 2011-08-15 HISTORY — PX: COLONOSCOPY: SHX5424

## 2011-08-15 LAB — GLUCOSE, CAPILLARY: Glucose-Capillary: 124 mg/dL — ABNORMAL HIGH (ref 70–99)

## 2011-08-15 SURGERY — COLONOSCOPY
Anesthesia: Moderate Sedation

## 2011-08-15 MED ORDER — MIDAZOLAM HCL 5 MG/5ML IJ SOLN
INTRAMUSCULAR | Status: DC | PRN
  Administered 2011-08-15: 1 mg via INTRAVENOUS
  Administered 2011-08-15 (×2): 2 mg via INTRAVENOUS

## 2011-08-15 MED ORDER — MIDAZOLAM HCL 5 MG/5ML IJ SOLN
INTRAMUSCULAR | Status: AC
  Filled 2011-08-15: qty 10

## 2011-08-15 MED ORDER — MEPERIDINE HCL 100 MG/ML IJ SOLN
INTRAMUSCULAR | Status: DC | PRN
  Administered 2011-08-15: 25 mg via INTRAVENOUS
  Administered 2011-08-15: 50 mg via INTRAVENOUS

## 2011-08-15 MED ORDER — STERILE WATER FOR IRRIGATION IR SOLN
Status: DC | PRN
  Administered 2011-08-15: 11:00:00

## 2011-08-15 MED ORDER — MEPERIDINE HCL 100 MG/ML IJ SOLN
INTRAMUSCULAR | Status: AC
  Filled 2011-08-15: qty 2

## 2011-08-15 NOTE — Op Note (Signed)
[facility] [address] [place], [place]  [address]  COLONOSCOPY PROCEDURE REPORT  PATIENT:  Nicholas, Franklin  MR#:  [id number] BIRTHDATE:  1940-02-19, 70 yrs. old  GENDER:  male ENDOSCOPIST:  R. Roetta Sessions, MD FACP [facility] REF. BY:  Glenice Laine, M.D. PROCEDURE DATE:  08/15/2011 PROCEDURE:  Screening colonoscopy  INDICATIONS:  Average risk colon cancer screening.  INFORMED CONSENT:  The risks, benefits, alternatives and imponderables including but not limited to bleeding, perforation as well as the possibility of a missed lesion have been reviewed. The potential for biopsy, lesion removal, etc. have also been discussed.  Questions have been answered.  All parties agreeable. Please see the history and physical in the medical record for more information.  MEDICATIONS:  Versed 5 mg IV and Demerol 75 mg IV in divided doses  DESCRIPTION OF PROCEDURE:  After a digital rectal exam was performed, the EC-[id number] ([id number]) colonoscope was advanced from the anus through the rectum and colon to the area of the cecum, ileocecal valve and appendiceal orifice.  The cecum was deeply intubated.  These structures were well-seen and photographed for the record.  From the level of the cecum and ileocecal valve, the scope was slowly and cautiously withdrawn.  The mucosal surfaces were carefully surveyed utilizing scope tip deflection to facilitate fold flattening as needed.  The scope was pulled down into the rectum where a thorough examination including retroflexion was performed. <<PROCEDUREIMAGES>>  FINDINGS: Marginal preparation. Anal papilla; otherwise normal rectum. Pancolonic diverticulosis; otherwise, the colonic mucosa appeared normal.  THERAPEUTIC / DIAGNOSTIC MANEUVERS PERFORMED:  COMPLICATIONS:  None  CECAL WITHDRAWAL TIME: 13 minutes  IMPRESSION: Colonic diverticulosis  RECOMMENDATIONS: Consider one more screening colonoscopy in  10 years  ______________________________ R. Roetta Sessions, MD Caleen Essex  CC:  Glenice Laine, M.D.  n. eSIGNED:   R. Roetta Sessions at 08/15/2011 11:32 AM  [place], [phone]

## 2011-08-15 NOTE — H&P (Signed)
Primary Care Physician:  Avon Gully, MD Primary Gastroenterologist:  Dr. Felecia Shelling  Pre-Procedure History & Physical: HPI:  [name] is a 71 y.o. male here for average risk screening colonoscopy. Reported negative colonoscopy 10 years ago. No bowel symptoms. No family history of polyps or colon cancer. Patient previously on Coumadin but not now.  Past Medical History  Diagnosis Date  . Hypertension   . Hyperlipidemia   . Diabetes mellitus     A1c of 7 -12/2009  . Obesity   . 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 DJD (degenerative joint disease)     left knee,cervical spine,lumbosacral spine    Past Surgical History  Procedure Date  . Total knee arthroplasty 2011  . 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 Cervical discectomy     and fusion  . Colonoscopy w/ polypectomy 2003    Prior to Admission medications   Medication Sig Start Date End Date Taking? Authorizing Provider  Cholecalciferol (VITAMIN D3) 2000 UNITS TABS Take 1 tablet by mouth daily.     Yes Historical Provider, MD  gabapentin (NEURONTIN) 600 MG tablet Take 600 mg by mouth 2 (two) times daily.     Yes Historical Provider, MD  glipiZIDE (GLUCOTROL) 5 MG tablet Take 5 mg by mouth once. Pt takes one tablet in the AM   Yes Historical Provider, MD  glyBURIDE-metformin (GLUCOVANCE) 5-500 MG per tablet Take 1 tablet by mouth daily with breakfast.     Yes Historical Provider, MD  metFORMIN (GLUCOPHAGE) 500 MG tablet Take 500 mg by mouth once. Takes one tablet in the AM   Yes Historical Provider, MD  Multiple Vitamins-Minerals (MULTIVITAMIN WITH MINERALS) tablet Take 1 tablet by mouth daily.     Yes Historical Provider, MD  propranolol-hydrochlorothiazide (INDERIDE) 40-25 MG per tablet Take 1 tablet by mouth daily.     Yes Historical Provider, MD  simvastatin (ZOCOR) 40 MG tablet Take 40 mg by mouth at bedtime.     Yes Historical  Provider, MD    Allergies as of 07/25/2011  . (No Known Allergies)    History reviewed. No pertinent family history.  History   Social History  . Marital Status: Widowed    Spouse Name: N/A    Number of Children: 3  . Years of Education: N/A   Occupational History  .     Social History Main Topics  . Smoking status: Never Smoker   . Smokeless tobacco: Never Used  . Alcohol Use: No  . Drug Use: No  . Sexually Active: Not on file   Other Topics Concern  . Not on file   Social History Narrative  . No narrative on file    Review of Systems: See HPI, otherwise negative ROS  Physical Exam: BP 128/90  Pulse 58  Temp 97.6 F (36.4 C) (Oral)  Resp 18  Ht 5\' 11"  (1.803 m)  Wt 214 lb (97.07 kg)  BMI 29.85 kg/m2  SpO2 95% General:   Alert,  Well-developed, well-nourished, pleasant and cooperative in NAD Skin:  Intact without significant lesions or rashes. Eyes:  Sclera clear, no icterus.   Conjunctiva pink. Ears:  Normal auditory acuity. Nose:  No deformity, discharge,  or lesions. Mouth:  No deformity or lesions. Neck:  Supple; no masses or thyromegaly. No significant cervical adenopathy. Lungs:  Clear throughout to auscultation.   No wheezes, crackles, or  rhonchi. No acute distress. Heart:  Regular rate and rhythm; no murmurs, clicks, rubs,  or gallops. Abdomen: Non-distended, normal bowel sounds.  Soft and nontender without appreciable mass or hepatosplenomegaly.  Pulses:  Normal pulses noted. Extremities:  Without clubbing or edema.  Impression/Plan: 71 year old gentleman presents for average risk screening colonoscopy.The risks, benefits, limitations, alternatives and imponderables have been reviewed with the patient. Questions have been answered. All parties are agreeable.

## 2011-08-20 ENCOUNTER — Encounter (HOSPITAL_COMMUNITY): Payer: Self-pay | Admitting: Internal Medicine

## 2011-12-07 ENCOUNTER — Other Ambulatory Visit (HOSPITAL_COMMUNITY): Payer: Self-pay | Admitting: Internal Medicine

## 2011-12-07 ENCOUNTER — Ambulatory Visit (HOSPITAL_COMMUNITY)
Admission: RE | Admit: 2011-12-07 | Discharge: 2011-12-07 | Disposition: A | Discharge status: Home / Self Care | Payer: Medicare HMO | Source: Ambulatory Visit | Attending: Internal Medicine | Admitting: Internal Medicine

## 2011-12-07 DIAGNOSIS — J40 Bronchitis, not specified as acute or chronic: Secondary | ICD-10-CM

## 2012-03-18 ENCOUNTER — Other Ambulatory Visit (HOSPITAL_COMMUNITY): Payer: Self-pay | Admitting: Internal Medicine

## 2012-03-18 ENCOUNTER — Ambulatory Visit (HOSPITAL_COMMUNITY)
Admission: RE | Admit: 2012-03-18 | Discharge: 2012-03-18 | Disposition: A | Discharge status: Home / Self Care | Payer: Medicare HMO | Source: Ambulatory Visit | Attending: Internal Medicine | Admitting: Internal Medicine

## 2012-03-18 DIAGNOSIS — M542 Cervicalgia: Secondary | ICD-10-CM | POA: Insufficient documentation

## 2012-03-18 DIAGNOSIS — M5030 Other cervical disc degeneration, unspecified cervical region: Secondary | ICD-10-CM | POA: Insufficient documentation

## 2012-03-26 ENCOUNTER — Other Ambulatory Visit (HOSPITAL_COMMUNITY): Payer: Self-pay | Admitting: Internal Medicine

## 2012-03-26 DIAGNOSIS — M542 Cervicalgia: Secondary | ICD-10-CM

## 2012-04-04 ENCOUNTER — Ambulatory Visit (HOSPITAL_COMMUNITY): Payer: Medicare HMO

## 2013-01-05 ENCOUNTER — Other Ambulatory Visit: Payer: Self-pay | Admitting: Neurosurgery

## 2013-01-05 DIAGNOSIS — M47812 Spondylosis without myelopathy or radiculopathy, cervical region: Secondary | ICD-10-CM

## 2013-01-07 ENCOUNTER — Other Ambulatory Visit: Payer: Medicare HMO

## 2013-01-13 ENCOUNTER — Other Ambulatory Visit: Payer: Self-pay | Admitting: Neurosurgery

## 2013-01-13 ENCOUNTER — Ambulatory Visit
Admission: RE | Admit: 2013-01-13 | Discharge: 2013-01-13 | Disposition: A | Discharge status: Home / Self Care | Payer: Medicare HMO | Source: Ambulatory Visit | Attending: Neurosurgery | Admitting: Neurosurgery

## 2013-01-13 DIAGNOSIS — M47812 Spondylosis without myelopathy or radiculopathy, cervical region: Secondary | ICD-10-CM

## 2013-01-13 DIAGNOSIS — M5412 Radiculopathy, cervical region: Secondary | ICD-10-CM | POA: Insufficient documentation

## 2013-02-09 ENCOUNTER — Inpatient Hospital Stay (HOSPITAL_COMMUNITY): Admission: RE | Admit: 2013-02-09 | Payer: Medicare HMO | Source: Ambulatory Visit | Admitting: Neurosurgery

## 2013-02-09 ENCOUNTER — Encounter (HOSPITAL_COMMUNITY): Admission: RE | Payer: Self-pay | Source: Ambulatory Visit

## 2013-02-09 SURGERY — POSTERIOR CERVICAL FUSION/FORAMINOTOMY LEVEL 1
Anesthesia: General | Laterality: Bilateral

## 2014-02-16 DIAGNOSIS — E119 Type 2 diabetes mellitus without complications: Secondary | ICD-10-CM | POA: Diagnosis not present

## 2014-02-16 DIAGNOSIS — M1990 Unspecified osteoarthritis, unspecified site: Secondary | ICD-10-CM | POA: Diagnosis not present

## 2014-02-16 DIAGNOSIS — Z0001 Encounter for general adult medical examination with abnormal findings: Secondary | ICD-10-CM | POA: Diagnosis not present

## 2014-02-16 DIAGNOSIS — I10 Essential (primary) hypertension: Secondary | ICD-10-CM | POA: Diagnosis not present

## 2014-03-19 DIAGNOSIS — I10 Essential (primary) hypertension: Secondary | ICD-10-CM | POA: Diagnosis not present

## 2014-03-19 DIAGNOSIS — M1990 Unspecified osteoarthritis, unspecified site: Secondary | ICD-10-CM | POA: Diagnosis not present

## 2014-03-19 DIAGNOSIS — E119 Type 2 diabetes mellitus without complications: Secondary | ICD-10-CM | POA: Diagnosis not present

## 2014-03-19 DIAGNOSIS — Z0001 Encounter for general adult medical examination with abnormal findings: Secondary | ICD-10-CM | POA: Diagnosis not present

## 2014-04-12 DIAGNOSIS — Z08 Encounter for follow-up examination after completed treatment for malignant neoplasm: Secondary | ICD-10-CM | POA: Diagnosis not present

## 2014-04-12 DIAGNOSIS — X32XXXD Exposure to sunlight, subsequent encounter: Secondary | ICD-10-CM | POA: Diagnosis not present

## 2014-04-12 DIAGNOSIS — L570 Actinic keratosis: Secondary | ICD-10-CM | POA: Diagnosis not present

## 2014-04-12 DIAGNOSIS — Z85828 Personal history of other malignant neoplasm of skin: Secondary | ICD-10-CM | POA: Diagnosis not present

## 2014-04-19 DIAGNOSIS — J40 Bronchitis, not specified as acute or chronic: Secondary | ICD-10-CM | POA: Diagnosis not present

## 2014-04-28 DIAGNOSIS — M546 Pain in thoracic spine: Secondary | ICD-10-CM | POA: Diagnosis not present

## 2014-04-28 DIAGNOSIS — Z79891 Long term (current) use of opiate analgesic: Secondary | ICD-10-CM | POA: Diagnosis not present

## 2014-04-28 DIAGNOSIS — G894 Chronic pain syndrome: Secondary | ICD-10-CM | POA: Diagnosis not present

## 2014-06-07 DIAGNOSIS — R05 Cough: Secondary | ICD-10-CM | POA: Diagnosis not present

## 2014-07-20 DIAGNOSIS — J019 Acute sinusitis, unspecified: Secondary | ICD-10-CM | POA: Diagnosis not present

## 2014-07-20 DIAGNOSIS — I10 Essential (primary) hypertension: Secondary | ICD-10-CM | POA: Diagnosis not present

## 2014-07-20 DIAGNOSIS — J309 Allergic rhinitis, unspecified: Secondary | ICD-10-CM | POA: Diagnosis not present

## 2014-07-20 DIAGNOSIS — E1165 Type 2 diabetes mellitus with hyperglycemia: Secondary | ICD-10-CM | POA: Diagnosis not present

## 2014-08-18 DIAGNOSIS — M961 Postlaminectomy syndrome, not elsewhere classified: Secondary | ICD-10-CM | POA: Diagnosis not present

## 2014-08-18 DIAGNOSIS — M5442 Lumbago with sciatica, left side: Secondary | ICD-10-CM | POA: Diagnosis not present

## 2014-08-18 DIAGNOSIS — M5441 Lumbago with sciatica, right side: Secondary | ICD-10-CM | POA: Diagnosis not present

## 2014-08-18 DIAGNOSIS — G894 Chronic pain syndrome: Secondary | ICD-10-CM | POA: Diagnosis not present

## 2014-10-04 DIAGNOSIS — H25011 Cortical age-related cataract, right eye: Secondary | ICD-10-CM | POA: Diagnosis not present

## 2014-10-04 DIAGNOSIS — H2511 Age-related nuclear cataract, right eye: Secondary | ICD-10-CM | POA: Diagnosis not present

## 2014-10-04 DIAGNOSIS — H21561 Pupillary abnormality, right eye: Secondary | ICD-10-CM | POA: Diagnosis not present

## 2014-11-01 DIAGNOSIS — E785 Hyperlipidemia, unspecified: Secondary | ICD-10-CM | POA: Diagnosis not present

## 2014-11-01 DIAGNOSIS — M1990 Unspecified osteoarthritis, unspecified site: Secondary | ICD-10-CM | POA: Diagnosis not present

## 2014-11-01 DIAGNOSIS — E1165 Type 2 diabetes mellitus with hyperglycemia: Secondary | ICD-10-CM | POA: Diagnosis not present

## 2014-11-01 DIAGNOSIS — Z23 Encounter for immunization: Secondary | ICD-10-CM | POA: Diagnosis not present

## 2014-11-01 DIAGNOSIS — E119 Type 2 diabetes mellitus without complications: Secondary | ICD-10-CM | POA: Diagnosis not present

## 2014-11-01 DIAGNOSIS — J40 Bronchitis, not specified as acute or chronic: Secondary | ICD-10-CM | POA: Diagnosis not present

## 2014-11-01 DIAGNOSIS — I10 Essential (primary) hypertension: Secondary | ICD-10-CM | POA: Diagnosis not present

## 2014-12-09 DIAGNOSIS — M5441 Lumbago with sciatica, right side: Secondary | ICD-10-CM | POA: Diagnosis not present

## 2014-12-09 DIAGNOSIS — M5442 Lumbago with sciatica, left side: Secondary | ICD-10-CM | POA: Diagnosis not present

## 2014-12-09 DIAGNOSIS — M961 Postlaminectomy syndrome, not elsewhere classified: Secondary | ICD-10-CM | POA: Diagnosis not present

## 2014-12-09 DIAGNOSIS — G8929 Other chronic pain: Secondary | ICD-10-CM | POA: Diagnosis not present

## 2015-01-10 DIAGNOSIS — E1165 Type 2 diabetes mellitus with hyperglycemia: Secondary | ICD-10-CM | POA: Diagnosis not present

## 2015-01-10 DIAGNOSIS — I10 Essential (primary) hypertension: Secondary | ICD-10-CM | POA: Diagnosis not present

## 2015-01-10 DIAGNOSIS — M545 Low back pain: Secondary | ICD-10-CM | POA: Diagnosis not present

## 2015-01-12 DIAGNOSIS — I10 Essential (primary) hypertension: Secondary | ICD-10-CM | POA: Diagnosis not present

## 2015-01-21 DIAGNOSIS — H5210 Myopia, unspecified eye: Secondary | ICD-10-CM | POA: Diagnosis not present

## 2015-01-21 DIAGNOSIS — H2510 Age-related nuclear cataract, unspecified eye: Secondary | ICD-10-CM | POA: Diagnosis not present

## 2015-01-21 DIAGNOSIS — E119 Type 2 diabetes mellitus without complications: Secondary | ICD-10-CM | POA: Diagnosis not present

## 2015-01-21 DIAGNOSIS — H5231 Anisometropia: Secondary | ICD-10-CM | POA: Diagnosis not present

## 2015-02-08 DIAGNOSIS — M5441 Lumbago with sciatica, right side: Secondary | ICD-10-CM | POA: Diagnosis not present

## 2015-02-08 DIAGNOSIS — G8929 Other chronic pain: Secondary | ICD-10-CM | POA: Diagnosis not present

## 2015-02-08 DIAGNOSIS — M5442 Lumbago with sciatica, left side: Secondary | ICD-10-CM | POA: Diagnosis not present

## 2015-02-08 DIAGNOSIS — M5136 Other intervertebral disc degeneration, lumbar region: Secondary | ICD-10-CM | POA: Diagnosis not present

## 2015-02-16 DIAGNOSIS — M5136 Other intervertebral disc degeneration, lumbar region: Secondary | ICD-10-CM | POA: Diagnosis not present

## 2015-03-03 DIAGNOSIS — G894 Chronic pain syndrome: Secondary | ICD-10-CM | POA: Diagnosis not present

## 2015-03-03 DIAGNOSIS — G8929 Other chronic pain: Secondary | ICD-10-CM | POA: Diagnosis not present

## 2015-03-03 DIAGNOSIS — M50322 Other cervical disc degeneration at C5-C6 level: Secondary | ICD-10-CM | POA: Diagnosis not present

## 2015-03-03 DIAGNOSIS — M5441 Lumbago with sciatica, right side: Secondary | ICD-10-CM | POA: Diagnosis not present

## 2015-03-07 DIAGNOSIS — L570 Actinic keratosis: Secondary | ICD-10-CM | POA: Diagnosis not present

## 2015-03-07 DIAGNOSIS — D225 Melanocytic nevi of trunk: Secondary | ICD-10-CM | POA: Diagnosis not present

## 2015-03-07 DIAGNOSIS — X32XXXD Exposure to sunlight, subsequent encounter: Secondary | ICD-10-CM | POA: Diagnosis not present

## 2015-04-14 DIAGNOSIS — F1020 Alcohol dependence, uncomplicated: Secondary | ICD-10-CM | POA: Diagnosis not present

## 2015-04-14 DIAGNOSIS — E119 Type 2 diabetes mellitus without complications: Secondary | ICD-10-CM | POA: Diagnosis not present

## 2015-04-14 DIAGNOSIS — E1169 Type 2 diabetes mellitus with other specified complication: Secondary | ICD-10-CM | POA: Diagnosis not present

## 2015-04-14 DIAGNOSIS — M549 Dorsalgia, unspecified: Secondary | ICD-10-CM | POA: Diagnosis not present

## 2015-04-14 DIAGNOSIS — E785 Hyperlipidemia, unspecified: Secondary | ICD-10-CM | POA: Diagnosis not present

## 2015-04-14 DIAGNOSIS — R8290 Unspecified abnormal findings in urine: Secondary | ICD-10-CM | POA: Diagnosis not present

## 2015-04-14 DIAGNOSIS — I10 Essential (primary) hypertension: Secondary | ICD-10-CM | POA: Diagnosis not present

## 2015-04-14 DIAGNOSIS — E1165 Type 2 diabetes mellitus with hyperglycemia: Secondary | ICD-10-CM | POA: Diagnosis not present

## 2015-05-05 DIAGNOSIS — G8929 Other chronic pain: Secondary | ICD-10-CM | POA: Diagnosis not present

## 2015-05-05 DIAGNOSIS — M50322 Other cervical disc degeneration at C5-C6 level: Secondary | ICD-10-CM | POA: Diagnosis not present

## 2015-05-05 DIAGNOSIS — M961 Postlaminectomy syndrome, not elsewhere classified: Secondary | ICD-10-CM | POA: Diagnosis not present

## 2015-05-05 DIAGNOSIS — M5441 Lumbago with sciatica, right side: Secondary | ICD-10-CM | POA: Diagnosis not present

## 2015-06-06 DIAGNOSIS — X32XXXD Exposure to sunlight, subsequent encounter: Secondary | ICD-10-CM | POA: Diagnosis not present

## 2015-06-06 DIAGNOSIS — L570 Actinic keratosis: Secondary | ICD-10-CM | POA: Diagnosis not present

## 2015-06-06 DIAGNOSIS — C44629 Squamous cell carcinoma of skin of left upper limb, including shoulder: Secondary | ICD-10-CM | POA: Diagnosis not present

## 2015-06-22 DIAGNOSIS — H00025 Hordeolum internum left lower eyelid: Secondary | ICD-10-CM | POA: Diagnosis not present

## 2015-07-05 DIAGNOSIS — H10502 Unspecified blepharoconjunctivitis, left eye: Secondary | ICD-10-CM | POA: Diagnosis not present

## 2015-07-21 DIAGNOSIS — Z96659 Presence of unspecified artificial knee joint: Secondary | ICD-10-CM | POA: Diagnosis not present

## 2015-07-21 DIAGNOSIS — J410 Simple chronic bronchitis: Secondary | ICD-10-CM | POA: Diagnosis not present

## 2015-07-21 DIAGNOSIS — E1165 Type 2 diabetes mellitus with hyperglycemia: Secondary | ICD-10-CM | POA: Diagnosis not present

## 2015-07-21 DIAGNOSIS — I10 Essential (primary) hypertension: Secondary | ICD-10-CM | POA: Diagnosis not present

## 2015-09-05 DIAGNOSIS — M5441 Lumbago with sciatica, right side: Secondary | ICD-10-CM | POA: Diagnosis not present

## 2015-09-05 DIAGNOSIS — M546 Pain in thoracic spine: Secondary | ICD-10-CM | POA: Diagnosis not present

## 2015-09-05 DIAGNOSIS — Z79891 Long term (current) use of opiate analgesic: Secondary | ICD-10-CM | POA: Diagnosis not present

## 2015-09-05 DIAGNOSIS — M5136 Other intervertebral disc degeneration, lumbar region: Secondary | ICD-10-CM | POA: Diagnosis not present

## 2015-11-23 DIAGNOSIS — M549 Dorsalgia, unspecified: Secondary | ICD-10-CM | POA: Diagnosis not present

## 2015-11-23 DIAGNOSIS — E1165 Type 2 diabetes mellitus with hyperglycemia: Secondary | ICD-10-CM | POA: Diagnosis not present

## 2015-11-23 DIAGNOSIS — M1990 Unspecified osteoarthritis, unspecified site: Secondary | ICD-10-CM | POA: Diagnosis not present

## 2015-11-23 DIAGNOSIS — I10 Essential (primary) hypertension: Secondary | ICD-10-CM | POA: Diagnosis not present

## 2015-12-26 DIAGNOSIS — G8929 Other chronic pain: Secondary | ICD-10-CM | POA: Diagnosis not present

## 2015-12-26 DIAGNOSIS — M5441 Lumbago with sciatica, right side: Secondary | ICD-10-CM | POA: Diagnosis not present

## 2015-12-26 DIAGNOSIS — M546 Pain in thoracic spine: Secondary | ICD-10-CM | POA: Diagnosis not present

## 2015-12-26 DIAGNOSIS — M961 Postlaminectomy syndrome, not elsewhere classified: Secondary | ICD-10-CM | POA: Diagnosis not present

## 2016-01-24 DIAGNOSIS — M5416 Radiculopathy, lumbar region: Secondary | ICD-10-CM | POA: Diagnosis not present

## 2016-03-01 DIAGNOSIS — E1165 Type 2 diabetes mellitus with hyperglycemia: Secondary | ICD-10-CM | POA: Diagnosis not present

## 2016-03-01 DIAGNOSIS — I10 Essential (primary) hypertension: Secondary | ICD-10-CM | POA: Diagnosis not present

## 2016-03-01 DIAGNOSIS — Z96659 Presence of unspecified artificial knee joint: Secondary | ICD-10-CM | POA: Diagnosis not present

## 2016-05-02 DIAGNOSIS — M5416 Radiculopathy, lumbar region: Secondary | ICD-10-CM | POA: Diagnosis not present

## 2016-05-02 DIAGNOSIS — G8929 Other chronic pain: Secondary | ICD-10-CM | POA: Diagnosis not present

## 2016-05-02 DIAGNOSIS — M5441 Lumbago with sciatica, right side: Secondary | ICD-10-CM | POA: Diagnosis not present

## 2016-05-02 DIAGNOSIS — M546 Pain in thoracic spine: Secondary | ICD-10-CM | POA: Diagnosis not present

## 2016-06-11 DIAGNOSIS — I10 Essential (primary) hypertension: Secondary | ICD-10-CM | POA: Diagnosis not present

## 2016-06-11 DIAGNOSIS — Z1389 Encounter for screening for other disorder: Secondary | ICD-10-CM | POA: Diagnosis not present

## 2016-06-11 DIAGNOSIS — M549 Dorsalgia, unspecified: Secondary | ICD-10-CM | POA: Diagnosis not present

## 2016-06-11 DIAGNOSIS — E119 Type 2 diabetes mellitus without complications: Secondary | ICD-10-CM | POA: Diagnosis not present

## 2016-06-11 DIAGNOSIS — C61 Malignant neoplasm of prostate: Secondary | ICD-10-CM | POA: Diagnosis not present

## 2016-06-11 DIAGNOSIS — E1165 Type 2 diabetes mellitus with hyperglycemia: Secondary | ICD-10-CM | POA: Diagnosis not present

## 2016-06-11 DIAGNOSIS — Z0000 Encounter for general adult medical examination without abnormal findings: Secondary | ICD-10-CM | POA: Diagnosis not present

## 2016-06-11 DIAGNOSIS — E785 Hyperlipidemia, unspecified: Secondary | ICD-10-CM | POA: Diagnosis not present

## 2016-06-11 DIAGNOSIS — Z96659 Presence of unspecified artificial knee joint: Secondary | ICD-10-CM | POA: Diagnosis not present

## 2016-06-11 DIAGNOSIS — R8290 Unspecified abnormal findings in urine: Secondary | ICD-10-CM | POA: Diagnosis not present

## 2016-07-16 DIAGNOSIS — M25522 Pain in left elbow: Secondary | ICD-10-CM | POA: Diagnosis not present

## 2016-07-16 DIAGNOSIS — M545 Low back pain: Secondary | ICD-10-CM | POA: Diagnosis not present

## 2016-08-22 DIAGNOSIS — M546 Pain in thoracic spine: Secondary | ICD-10-CM | POA: Diagnosis not present

## 2016-08-22 DIAGNOSIS — Z79891 Long term (current) use of opiate analgesic: Secondary | ICD-10-CM | POA: Diagnosis not present

## 2016-08-22 DIAGNOSIS — M5136 Other intervertebral disc degeneration, lumbar region: Secondary | ICD-10-CM | POA: Diagnosis not present

## 2016-08-22 DIAGNOSIS — G894 Chronic pain syndrome: Secondary | ICD-10-CM | POA: Diagnosis not present

## 2016-09-03 DIAGNOSIS — C44319 Basal cell carcinoma of skin of other parts of face: Secondary | ICD-10-CM | POA: Diagnosis not present

## 2016-09-03 DIAGNOSIS — L570 Actinic keratosis: Secondary | ICD-10-CM | POA: Diagnosis not present

## 2016-09-03 DIAGNOSIS — Z1283 Encounter for screening for malignant neoplasm of skin: Secondary | ICD-10-CM | POA: Diagnosis not present

## 2016-09-03 DIAGNOSIS — Z85828 Personal history of other malignant neoplasm of skin: Secondary | ICD-10-CM | POA: Diagnosis not present

## 2016-09-03 DIAGNOSIS — X32XXXD Exposure to sunlight, subsequent encounter: Secondary | ICD-10-CM | POA: Diagnosis not present

## 2016-09-03 DIAGNOSIS — Z08 Encounter for follow-up examination after completed treatment for malignant neoplasm: Secondary | ICD-10-CM | POA: Diagnosis not present

## 2016-10-03 DIAGNOSIS — J410 Simple chronic bronchitis: Secondary | ICD-10-CM | POA: Diagnosis not present

## 2016-10-03 DIAGNOSIS — M1990 Unspecified osteoarthritis, unspecified site: Secondary | ICD-10-CM | POA: Diagnosis not present

## 2016-10-03 DIAGNOSIS — I10 Essential (primary) hypertension: Secondary | ICD-10-CM | POA: Diagnosis not present

## 2016-10-03 DIAGNOSIS — E1142 Type 2 diabetes mellitus with diabetic polyneuropathy: Secondary | ICD-10-CM | POA: Diagnosis not present

## 2016-12-24 DIAGNOSIS — M546 Pain in thoracic spine: Secondary | ICD-10-CM | POA: Diagnosis not present

## 2016-12-24 DIAGNOSIS — M5416 Radiculopathy, lumbar region: Secondary | ICD-10-CM | POA: Diagnosis not present

## 2016-12-24 DIAGNOSIS — M5441 Lumbago with sciatica, right side: Secondary | ICD-10-CM | POA: Diagnosis not present

## 2016-12-24 DIAGNOSIS — G8929 Other chronic pain: Secondary | ICD-10-CM | POA: Diagnosis not present

## 2016-12-24 DIAGNOSIS — Z79891 Long term (current) use of opiate analgesic: Secondary | ICD-10-CM | POA: Diagnosis not present

## 2017-01-02 DIAGNOSIS — I10 Essential (primary) hypertension: Secondary | ICD-10-CM | POA: Diagnosis not present

## 2017-01-02 DIAGNOSIS — M545 Low back pain: Secondary | ICD-10-CM | POA: Diagnosis not present

## 2017-01-02 DIAGNOSIS — E1142 Type 2 diabetes mellitus with diabetic polyneuropathy: Secondary | ICD-10-CM | POA: Diagnosis not present

## 2017-01-02 DIAGNOSIS — J410 Simple chronic bronchitis: Secondary | ICD-10-CM | POA: Diagnosis not present

## 2017-02-13 DIAGNOSIS — H25013 Cortical age-related cataract, bilateral: Secondary | ICD-10-CM | POA: Diagnosis not present

## 2017-02-13 DIAGNOSIS — H524 Presbyopia: Secondary | ICD-10-CM | POA: Diagnosis not present

## 2017-02-13 DIAGNOSIS — E119 Type 2 diabetes mellitus without complications: Secondary | ICD-10-CM | POA: Diagnosis not present

## 2017-03-08 DIAGNOSIS — E1142 Type 2 diabetes mellitus with diabetic polyneuropathy: Secondary | ICD-10-CM | POA: Diagnosis not present

## 2017-03-08 DIAGNOSIS — J029 Acute pharyngitis, unspecified: Secondary | ICD-10-CM | POA: Diagnosis not present

## 2017-04-03 DIAGNOSIS — G894 Chronic pain syndrome: Secondary | ICD-10-CM | POA: Diagnosis not present

## 2017-04-09 DIAGNOSIS — J410 Simple chronic bronchitis: Secondary | ICD-10-CM | POA: Diagnosis not present

## 2017-04-09 DIAGNOSIS — E1142 Type 2 diabetes mellitus with diabetic polyneuropathy: Secondary | ICD-10-CM | POA: Diagnosis not present

## 2017-04-09 DIAGNOSIS — I10 Essential (primary) hypertension: Secondary | ICD-10-CM | POA: Diagnosis not present

## 2017-04-09 DIAGNOSIS — M549 Dorsalgia, unspecified: Secondary | ICD-10-CM | POA: Diagnosis not present

## 2017-05-09 DIAGNOSIS — M5416 Radiculopathy, lumbar region: Secondary | ICD-10-CM | POA: Insufficient documentation

## 2017-06-04 DIAGNOSIS — M5416 Radiculopathy, lumbar region: Secondary | ICD-10-CM | POA: Diagnosis not present

## 2017-07-03 DIAGNOSIS — M4802 Spinal stenosis, cervical region: Secondary | ICD-10-CM | POA: Diagnosis not present

## 2017-07-03 DIAGNOSIS — G894 Chronic pain syndrome: Secondary | ICD-10-CM | POA: Diagnosis not present

## 2017-07-03 DIAGNOSIS — M5416 Radiculopathy, lumbar region: Secondary | ICD-10-CM | POA: Diagnosis not present

## 2017-07-03 DIAGNOSIS — Z79891 Long term (current) use of opiate analgesic: Secondary | ICD-10-CM | POA: Diagnosis not present

## 2017-07-04 DIAGNOSIS — Z1331 Encounter for screening for depression: Secondary | ICD-10-CM | POA: Diagnosis not present

## 2017-07-04 DIAGNOSIS — Z1389 Encounter for screening for other disorder: Secondary | ICD-10-CM | POA: Diagnosis not present

## 2017-07-04 DIAGNOSIS — I10 Essential (primary) hypertension: Secondary | ICD-10-CM | POA: Diagnosis not present

## 2017-07-04 DIAGNOSIS — E1165 Type 2 diabetes mellitus with hyperglycemia: Secondary | ICD-10-CM | POA: Diagnosis not present

## 2017-07-04 DIAGNOSIS — Z96659 Presence of unspecified artificial knee joint: Secondary | ICD-10-CM | POA: Diagnosis not present

## 2017-07-04 DIAGNOSIS — R8290 Unspecified abnormal findings in urine: Secondary | ICD-10-CM | POA: Diagnosis not present

## 2017-07-04 DIAGNOSIS — E119 Type 2 diabetes mellitus without complications: Secondary | ICD-10-CM | POA: Diagnosis not present

## 2017-07-04 DIAGNOSIS — C61 Malignant neoplasm of prostate: Secondary | ICD-10-CM | POA: Diagnosis not present

## 2017-07-04 DIAGNOSIS — E785 Hyperlipidemia, unspecified: Secondary | ICD-10-CM | POA: Diagnosis not present

## 2017-07-04 DIAGNOSIS — Z0000 Encounter for general adult medical examination without abnormal findings: Secondary | ICD-10-CM | POA: Diagnosis not present

## 2017-07-04 DIAGNOSIS — L089 Local infection of the skin and subcutaneous tissue, unspecified: Secondary | ICD-10-CM | POA: Diagnosis not present

## 2017-07-05 DIAGNOSIS — R8290 Unspecified abnormal findings in urine: Secondary | ICD-10-CM | POA: Diagnosis not present

## 2017-07-05 DIAGNOSIS — I10 Essential (primary) hypertension: Secondary | ICD-10-CM | POA: Diagnosis not present

## 2017-07-05 DIAGNOSIS — E119 Type 2 diabetes mellitus without complications: Secondary | ICD-10-CM | POA: Diagnosis not present

## 2017-07-05 DIAGNOSIS — E785 Hyperlipidemia, unspecified: Secondary | ICD-10-CM | POA: Diagnosis not present

## 2017-07-05 DIAGNOSIS — C61 Malignant neoplasm of prostate: Secondary | ICD-10-CM | POA: Diagnosis not present

## 2017-07-23 DIAGNOSIS — M5030 Other cervical disc degeneration, unspecified cervical region: Secondary | ICD-10-CM | POA: Insufficient documentation

## 2017-07-25 ENCOUNTER — Ambulatory Visit: Payer: Self-pay | Admitting: Podiatry

## 2017-07-26 DIAGNOSIS — M25512 Pain in left shoulder: Secondary | ICD-10-CM | POA: Diagnosis not present

## 2017-08-12 ENCOUNTER — Encounter: Payer: Self-pay | Admitting: Podiatry

## 2017-08-12 ENCOUNTER — Ambulatory Visit (INDEPENDENT_AMBULATORY_CARE_PROVIDER_SITE_OTHER): Payer: Medicare HMO

## 2017-08-12 ENCOUNTER — Ambulatory Visit: Payer: Medicare HMO | Admitting: Podiatry

## 2017-08-12 VITALS — BP 137/71 | HR 56 | Resp 16

## 2017-08-12 DIAGNOSIS — M79674 Pain in right toe(s): Secondary | ICD-10-CM

## 2017-08-12 DIAGNOSIS — S92521A Displaced fracture of medial phalanx of right lesser toe(s), initial encounter for closed fracture: Secondary | ICD-10-CM

## 2017-08-12 NOTE — Progress Notes (Signed)
Subjective:    Patient ID: [name], male    DOB: 14-Jul-1940, 77 y.o.   MRN: [id number]  HPI 77 year old male presents the office today for concerns of right second toe swelling and redness which is been ongoing for the last 2 months.  He denies any recent injury or trauma.  He did see his primary care physician was given antibiotics for this.  He states is gotten somewhat better in regards to swelling or redness but is taking a long time.  He is diabetic and last A1c was 7 he reports.  He does not check his blood sugar today.  He does state that he has neuropathy.  He has noticed that his second toe started to drift to the side some.  He has no other concerns.   Review of Systems  All other systems reviewed and are negative.  Past Medical History:  Diagnosis Date  . Diabetes mellitus    A1c of 7 -12/2009  . DJD (degenerative joint disease)    left knee,cervical spine,lumbosacral spine  . DVT (deep venous thrombosis) ([place]) 12/2009   left lower extremity  following left TKA  . Hyperlipidemia   . Hypertension   . Obesity     Past Surgical History:  Procedure Laterality Date  . CERVICAL DISCECTOMY     and fusion  . COLONOSCOPY  08/15/2011   Procedure: COLONOSCOPY;  Surgeon: Daneil Dolin, MD;  [facility];  Service: Endoscopy;  Laterality: N/A;  11:15 AM  . COLONOSCOPY W/ POLYPECTOMY  2003  . TOTAL KNEE ARTHROPLASTY  2011  . TOTAL KNEE ARTHROPLASTY  03/2008  . TOTAL SHOULDER ARTHROPLASTY  05/2009   right      Current Outpatient Medications:  .  cephALEXin (KEFLEX) 500 MG capsule, cephalexin 500 mg capsule  TK ONE C PO  QID, Disp: , Rfl:  .  Cholecalciferol (VITAMIN D3) 2000 UNITS TABS, Take 1 tablet by mouth daily.  , Disp: , Rfl:  .  gabapentin (NEURONTIN) 600 MG tablet, Take 600 mg by mouth 2 (two) times daily.  , Disp: , Rfl:  .  glipiZIDE (GLUCOTROL) 5 MG tablet, Take 5 mg by mouth once. Pt takes one tablet in the AM, Disp: , Rfl:  .  glyBURIDE-metformin  (GLUCOVANCE) 5-500 MG per tablet, Take 1 tablet by mouth daily with breakfast.  , Disp: , Rfl:  .  HYDROcodone-acetaminophen (NORCO) 10-325 MG tablet, TK 1 T PO QID PRN, Disp: , Rfl: 0 .  Influenza vac split quadrivalent PF (FLUZONE QUADRIVALENT) 0.5 ML injection, Fluzone Quad 2018-19(PF) 60 mcg(15 mcgx4)/0.5 mL intramuscular syringe  ADM 0.5ML IM UTD, Disp: , Rfl:  .  metFORMIN (GLUCOPHAGE) 500 MG tablet, Take 500 mg by mouth once. Takes one tablet in the AM, Disp: , Rfl:  .  Multiple Vitamins-Minerals (MULTIVITAMIN WITH MINERALS) tablet, Take 1 tablet by mouth daily.  , Disp: , Rfl:  .  propranolol-hydrochlorothiazide (INDERIDE) 40-25 MG per tablet, Take 1 tablet by mouth daily.  , Disp: , Rfl:  .  simvastatin (ZOCOR) 40 MG tablet, Take 40 mg by mouth at bedtime.  , Disp: , Rfl:   No Known Allergies       Objective:   Physical Exam  General: AAO x3, NAD  Dermatological: There is faint erythema to the right second toe as well as edema to the second toe but there is no open lesions identified there is no increase in warmth.  There is no ascending cellulitis there is  no fluctuation or crepitation.  Vascular: Dorsalis Pedis artery and Posterior Tibial artery pedal pulses are 2/4 bilateral with immedate capillary fill time. There is no pain with calf compression, swelling, warmth, erythema.   Neruologic: Sensation decreased with Derrel Nip monofilament.  Musculoskeletal: There is slight lateral deviation of the second toe on the right foot.  There is no significant tenderness palpation of the toe however he does have neuropathy.  Muscular strength 5/5 in all groups tested bilateral.      Assessment & Plan:  77 year old male with right second toe middle phalanx fracture -Treatment options discussed including all alternatives, risks, and complications -Etiology of symptoms were discussed -X-rays were obtained and reviewed with the patient.  There is a oblique fracture present of the  middle phalanx.  No other evidence of acute fracture identified today. -I do think that his swelling as well as the position of the toe changes with the result of the fracture.  Will immobilize in a surgical shoe today to help facilitate healing given his neuropathy although it has been the last 2 months.  Discussed with him the position feels like not to change about surgery however it is not significant I do not think this is going to cause an issue  Return in about 3 weeks (around 09/02/2017). X-ray next appointment   Trula Slade DPM

## 2017-08-12 NOTE — Patient Instructions (Signed)
Toe Fracture A toe fracture is a break in one of the toe bones (phalanges). Follow these instructions at home: If you have a cast:  Do not stick anything inside the cast to scratch your skin.  Check the skin around the cast every day. Tell your doctor about any concerns. Do not put lotion on the skin underneath the cast. You may put lotion on dry skin around the edges of the cast.  Do not put pressure on any part of the cast until it is fully hardened. This may take many hours.  Keep the cast clean and dry. Bathing  Do not take baths, swim, or use a hot tub until your doctor says that you can. Ask your doctor if you can take showers. You may only be allowed to take sponge baths for bathing.  If your doctor says that bathing and showering are okay, cover the cast or bandage (dressing) with a watertight plastic bag to protect it from water. Do not let the cast or bandage get wet. Managing pain, stiffness, and swelling  If you do not have a cast, put ice on the injured area if told by your doctor: ? Put ice in a plastic bag. ? Place a towel between your skin and the bag. ? Leave the ice on for 20 minutes, 2-3 times per day.  Move your toes often to avoid stiffness and to lessen swelling.  Raise (elevate) the injured area above the level of your heart while you are sitting or lying down. Driving  Do not drive or use heavy machinery while taking pain medicine.  Do not drive while wearing a cast on a foot that you use for driving. Activity  Return to your normal activities as told by your doctor. Ask your doctor what activities are safe for you.  Perform exercises daily as told by your doctor or therapist. Safety  Do not use your leg to support your body weight until your doctor says that you can. Use crutches or other tools to help you move around as told by your doctor. General instructions  If your toe was taped to a toe that is next to it (buddy taping), follow your doctor's  instructions for changing the gauze and tape. Change it more often: ? If the gauze and tape get wet. If this happens, dry the space between the toes. ? If the gauze and tape are too tight and they cause your toe to become pale or to lose feeling (numb).  Wear a protective shoe as told by your doctor. If you were not given one, wear sturdy shoes that support your foot. Your shoes should not pinch your toes. Your shoes should not fit tightly against your toes.  Do not use any tobacco products, including cigarettes, chewing tobacco, or e-cigarettes. Tobacco can delay bone healing. If you need help quitting, ask your doctor.  Take medicines only as told by your doctor.  Keep all follow-up visits as told by your doctor. This is important. Contact a doctor if:  You have a fever.  Your pain medicine is not helping.  Your toe feels cold.  You lose feeling (have numbness) in your toe.  You still have pain after one week of rest and treatment.  You still have pain after your doctor has said that you can start walking again.  You have pain or tingling in your foot, and it is not going away.  You have loss of feeling in your foot, and it is   not going away. Get help right away if:  You have severe pain.  You have redness or swelling (inflammation) in your toe, and it is getting worse.  You have pain or loss of feeling in your toe, and it is getting worse.  Your toe is blue. This information is not intended to replace advice given to you by your health care provider. Make sure you discuss any questions you have with your health care provider. Document Released: 07/11/2007 Document Revised: 09/26/2015 Document Reviewed: 11/18/2013 Elsevier Interactive Patient Education  2018 [employer]  

## 2017-08-16 DIAGNOSIS — E1165 Type 2 diabetes mellitus with hyperglycemia: Secondary | ICD-10-CM | POA: Diagnosis not present

## 2017-08-16 DIAGNOSIS — M549 Dorsalgia, unspecified: Secondary | ICD-10-CM | POA: Diagnosis not present

## 2017-08-16 DIAGNOSIS — Z0000 Encounter for general adult medical examination without abnormal findings: Secondary | ICD-10-CM | POA: Diagnosis not present

## 2017-08-16 DIAGNOSIS — C61 Malignant neoplasm of prostate: Secondary | ICD-10-CM | POA: Diagnosis not present

## 2017-08-16 DIAGNOSIS — I10 Essential (primary) hypertension: Secondary | ICD-10-CM | POA: Diagnosis not present

## 2017-08-16 DIAGNOSIS — R739 Hyperglycemia, unspecified: Secondary | ICD-10-CM | POA: Diagnosis not present

## 2017-08-16 DIAGNOSIS — E669 Obesity, unspecified: Secondary | ICD-10-CM | POA: Diagnosis not present

## 2017-08-16 DIAGNOSIS — E119 Type 2 diabetes mellitus without complications: Secondary | ICD-10-CM | POA: Diagnosis not present

## 2017-08-16 DIAGNOSIS — E785 Hyperlipidemia, unspecified: Secondary | ICD-10-CM | POA: Diagnosis not present

## 2017-08-16 DIAGNOSIS — E039 Hypothyroidism, unspecified: Secondary | ICD-10-CM | POA: Diagnosis not present

## 2017-09-02 ENCOUNTER — Ambulatory Visit (INDEPENDENT_AMBULATORY_CARE_PROVIDER_SITE_OTHER): Payer: Medicare HMO

## 2017-09-02 ENCOUNTER — Ambulatory Visit: Payer: Medicare HMO | Admitting: Podiatry

## 2017-09-02 DIAGNOSIS — S92521A Displaced fracture of medial phalanx of right lesser toe(s), initial encounter for closed fracture: Secondary | ICD-10-CM | POA: Diagnosis not present

## 2017-09-02 DIAGNOSIS — E1149 Type 2 diabetes mellitus with other diabetic neurological complication: Secondary | ICD-10-CM | POA: Diagnosis not present

## 2017-09-02 DIAGNOSIS — S92521D Displaced fracture of medial phalanx of right lesser toe(s), subsequent encounter for fracture with routine healing: Secondary | ICD-10-CM | POA: Diagnosis not present

## 2017-09-04 NOTE — Progress Notes (Signed)
Subjective: [name] presents the office today for follow-up evaluation of right second toe fracture.  He states the swelling has improved as well as the redness he is having no pain however he does have neuropathy.  He states that he does get some sharp pains with his feet at times.  He is currently taking gabapentin for neuropathy.  He denies any claudication symptoms.  Is no other concerns today. Denies any systemic complaints such as fevers, chills, nausea, vomiting. No acute changes since last appointment, and no other complaints at this time.   Objective: AAO x3, NAD DP/PT pulses palpable bilaterally, CRT less than 3 seconds Sensation decreased with Simms Weinstein monofilament. There is still some edema and faint erythema mostly to the distal portion of the right second toe however is clinically improved.  There is no tenderness palpation is no open sores there is no increase in warmth.  Does not appear to be infection. No open lesions or pre-ulcerative lesions.  No pain with calf compression, swelling, warmth, erythema  Assessment: Right second toe fracture, neuropathy  Plan: -All treatment options discussed with the patient including all alternatives, risks, complications.  -X-rays were obtained and reviewed.  There is increased consolidation across the fracture site.  No new evidence of acute fracture. -In regards to the fracture continue with surgical shoe.  As the swelling improves he can return to regular shoe. -Regards to neuropathy his asthma other treatments.  We will start with a compound cream that I ordered for neuropathy through [employer].  Discussed the treatment options including Neuogenix.  -Patient encouraged to call the office with any questions, concerns, change in symptoms.   Return in about 1 month (around 09/30/2017).  Trula Slade DPM

## 2017-09-30 DIAGNOSIS — M1990 Unspecified osteoarthritis, unspecified site: Secondary | ICD-10-CM | POA: Diagnosis not present

## 2017-09-30 DIAGNOSIS — E1142 Type 2 diabetes mellitus with diabetic polyneuropathy: Secondary | ICD-10-CM | POA: Diagnosis not present

## 2017-09-30 DIAGNOSIS — I10 Essential (primary) hypertension: Secondary | ICD-10-CM | POA: Diagnosis not present

## 2017-10-02 DIAGNOSIS — M542 Cervicalgia: Secondary | ICD-10-CM | POA: Diagnosis not present

## 2017-10-02 DIAGNOSIS — M5136 Other intervertebral disc degeneration, lumbar region: Secondary | ICD-10-CM | POA: Diagnosis not present

## 2017-10-02 DIAGNOSIS — G894 Chronic pain syndrome: Secondary | ICD-10-CM | POA: Diagnosis not present

## 2017-10-18 ENCOUNTER — Ambulatory Visit: Payer: Medicare HMO | Admitting: Urology

## 2017-10-18 DIAGNOSIS — N401 Enlarged prostate with lower urinary tract symptoms: Secondary | ICD-10-CM | POA: Diagnosis not present

## 2017-10-18 DIAGNOSIS — R351 Nocturia: Secondary | ICD-10-CM | POA: Diagnosis not present

## 2017-10-18 DIAGNOSIS — R972 Elevated prostate specific antigen [PSA]: Secondary | ICD-10-CM

## 2017-11-06 DIAGNOSIS — D485 Neoplasm of uncertain behavior of skin: Secondary | ICD-10-CM | POA: Diagnosis not present

## 2017-11-06 DIAGNOSIS — L570 Actinic keratosis: Secondary | ICD-10-CM | POA: Diagnosis not present

## 2017-11-06 DIAGNOSIS — D225 Melanocytic nevi of trunk: Secondary | ICD-10-CM | POA: Diagnosis not present

## 2017-11-06 DIAGNOSIS — X32XXXD Exposure to sunlight, subsequent encounter: Secondary | ICD-10-CM | POA: Diagnosis not present

## 2017-11-06 DIAGNOSIS — Z85828 Personal history of other malignant neoplasm of skin: Secondary | ICD-10-CM | POA: Diagnosis not present

## 2017-11-06 DIAGNOSIS — Z08 Encounter for follow-up examination after completed treatment for malignant neoplasm: Secondary | ICD-10-CM | POA: Diagnosis not present

## 2017-12-18 DIAGNOSIS — Z08 Encounter for follow-up examination after completed treatment for malignant neoplasm: Secondary | ICD-10-CM | POA: Diagnosis not present

## 2017-12-18 DIAGNOSIS — Z85828 Personal history of other malignant neoplasm of skin: Secondary | ICD-10-CM | POA: Diagnosis not present

## 2017-12-18 DIAGNOSIS — L570 Actinic keratosis: Secondary | ICD-10-CM | POA: Diagnosis not present

## 2017-12-18 DIAGNOSIS — X32XXXD Exposure to sunlight, subsequent encounter: Secondary | ICD-10-CM | POA: Diagnosis not present

## 2017-12-26 DIAGNOSIS — D040 Carcinoma in situ of skin of lip: Secondary | ICD-10-CM | POA: Diagnosis not present

## 2018-01-13 ENCOUNTER — Other Ambulatory Visit (HOSPITAL_COMMUNITY): Payer: Self-pay | Admitting: Internal Medicine

## 2018-01-13 ENCOUNTER — Ambulatory Visit (HOSPITAL_COMMUNITY)
Admission: RE | Admit: 2018-01-13 | Discharge: 2018-01-13 | Disposition: A | Discharge status: Home / Self Care | Payer: Medicare HMO | Source: Ambulatory Visit | Attending: Internal Medicine | Admitting: Internal Medicine

## 2018-01-13 DIAGNOSIS — Z96612 Presence of left artificial shoulder joint: Secondary | ICD-10-CM | POA: Diagnosis not present

## 2018-01-13 DIAGNOSIS — Z471 Aftercare following joint replacement surgery: Secondary | ICD-10-CM | POA: Diagnosis not present

## 2018-01-13 DIAGNOSIS — M25511 Pain in right shoulder: Secondary | ICD-10-CM | POA: Insufficient documentation

## 2018-01-13 DIAGNOSIS — M19011 Primary osteoarthritis, right shoulder: Secondary | ICD-10-CM | POA: Diagnosis not present

## 2018-01-13 DIAGNOSIS — I10 Essential (primary) hypertension: Secondary | ICD-10-CM | POA: Diagnosis not present

## 2018-01-13 DIAGNOSIS — E1142 Type 2 diabetes mellitus with diabetic polyneuropathy: Secondary | ICD-10-CM | POA: Diagnosis not present

## 2018-01-14 DIAGNOSIS — G894 Chronic pain syndrome: Secondary | ICD-10-CM | POA: Diagnosis not present

## 2018-01-14 DIAGNOSIS — M542 Cervicalgia: Secondary | ICD-10-CM | POA: Diagnosis not present

## 2018-01-14 DIAGNOSIS — Z79891 Long term (current) use of opiate analgesic: Secondary | ICD-10-CM | POA: Diagnosis not present

## 2018-01-14 DIAGNOSIS — M5030 Other cervical disc degeneration, unspecified cervical region: Secondary | ICD-10-CM | POA: Diagnosis not present

## 2018-01-14 DIAGNOSIS — M961 Postlaminectomy syndrome, not elsewhere classified: Secondary | ICD-10-CM | POA: Diagnosis not present

## 2018-01-22 DIAGNOSIS — M5030 Other cervical disc degeneration, unspecified cervical region: Secondary | ICD-10-CM | POA: Diagnosis not present

## 2018-01-27 DIAGNOSIS — M4802 Spinal stenosis, cervical region: Secondary | ICD-10-CM | POA: Diagnosis not present

## 2018-02-17 DIAGNOSIS — L905 Scar conditions and fibrosis of skin: Secondary | ICD-10-CM | POA: Diagnosis not present

## 2018-02-17 DIAGNOSIS — Z85828 Personal history of other malignant neoplasm of skin: Secondary | ICD-10-CM | POA: Diagnosis not present

## 2018-03-20 DIAGNOSIS — M47812 Spondylosis without myelopathy or radiculopathy, cervical region: Secondary | ICD-10-CM | POA: Diagnosis not present

## 2018-04-02 DIAGNOSIS — H524 Presbyopia: Secondary | ICD-10-CM | POA: Diagnosis not present

## 2018-04-02 DIAGNOSIS — E119 Type 2 diabetes mellitus without complications: Secondary | ICD-10-CM | POA: Diagnosis not present

## 2018-04-18 ENCOUNTER — Ambulatory Visit: Payer: Medicare HMO | Admitting: Urology

## 2018-04-18 DIAGNOSIS — R972 Elevated prostate specific antigen [PSA]: Secondary | ICD-10-CM

## 2018-04-18 DIAGNOSIS — R351 Nocturia: Secondary | ICD-10-CM | POA: Diagnosis not present

## 2018-04-18 DIAGNOSIS — N401 Enlarged prostate with lower urinary tract symptoms: Secondary | ICD-10-CM

## 2018-05-08 DIAGNOSIS — K5903 Drug induced constipation: Secondary | ICD-10-CM | POA: Diagnosis not present

## 2018-05-08 DIAGNOSIS — M4722 Other spondylosis with radiculopathy, cervical region: Secondary | ICD-10-CM | POA: Diagnosis not present

## 2018-05-12 DIAGNOSIS — I10 Essential (primary) hypertension: Secondary | ICD-10-CM | POA: Diagnosis not present

## 2018-05-12 DIAGNOSIS — R51 Headache: Secondary | ICD-10-CM | POA: Diagnosis not present

## 2018-05-12 DIAGNOSIS — E1142 Type 2 diabetes mellitus with diabetic polyneuropathy: Secondary | ICD-10-CM | POA: Diagnosis not present

## 2018-07-03 DIAGNOSIS — I10 Essential (primary) hypertension: Secondary | ICD-10-CM | POA: Diagnosis not present

## 2018-07-03 DIAGNOSIS — E1142 Type 2 diabetes mellitus with diabetic polyneuropathy: Secondary | ICD-10-CM | POA: Diagnosis not present

## 2018-07-15 DIAGNOSIS — M542 Cervicalgia: Secondary | ICD-10-CM | POA: Diagnosis not present

## 2018-07-15 DIAGNOSIS — M5030 Other cervical disc degeneration, unspecified cervical region: Secondary | ICD-10-CM | POA: Diagnosis not present

## 2018-07-15 DIAGNOSIS — M4722 Other spondylosis with radiculopathy, cervical region: Secondary | ICD-10-CM | POA: Diagnosis not present

## 2018-07-29 DIAGNOSIS — M549 Dorsalgia, unspecified: Secondary | ICD-10-CM | POA: Diagnosis not present

## 2018-07-29 DIAGNOSIS — E1142 Type 2 diabetes mellitus with diabetic polyneuropathy: Secondary | ICD-10-CM | POA: Diagnosis not present

## 2018-07-29 DIAGNOSIS — I10 Essential (primary) hypertension: Secondary | ICD-10-CM | POA: Diagnosis not present

## 2018-07-29 DIAGNOSIS — Z0001 Encounter for general adult medical examination with abnormal findings: Secondary | ICD-10-CM | POA: Diagnosis not present

## 2018-07-29 DIAGNOSIS — Z1331 Encounter for screening for depression: Secondary | ICD-10-CM | POA: Diagnosis not present

## 2018-07-29 DIAGNOSIS — Z1389 Encounter for screening for other disorder: Secondary | ICD-10-CM | POA: Diagnosis not present

## 2018-08-05 DIAGNOSIS — I10 Essential (primary) hypertension: Secondary | ICD-10-CM | POA: Diagnosis not present

## 2018-08-05 DIAGNOSIS — Z125 Encounter for screening for malignant neoplasm of prostate: Secondary | ICD-10-CM | POA: Diagnosis not present

## 2018-08-05 DIAGNOSIS — E1165 Type 2 diabetes mellitus with hyperglycemia: Secondary | ICD-10-CM | POA: Diagnosis not present

## 2018-08-05 DIAGNOSIS — Z0001 Encounter for general adult medical examination with abnormal findings: Secondary | ICD-10-CM | POA: Diagnosis not present

## 2018-08-28 DIAGNOSIS — I10 Essential (primary) hypertension: Secondary | ICD-10-CM | POA: Diagnosis not present

## 2018-08-28 DIAGNOSIS — E1165 Type 2 diabetes mellitus with hyperglycemia: Secondary | ICD-10-CM | POA: Diagnosis not present

## 2018-09-03 DIAGNOSIS — M47812 Spondylosis without myelopathy or radiculopathy, cervical region: Secondary | ICD-10-CM | POA: Diagnosis not present

## 2018-09-15 DIAGNOSIS — Z79899 Other long term (current) drug therapy: Secondary | ICD-10-CM | POA: Diagnosis not present

## 2018-09-15 DIAGNOSIS — M47812 Spondylosis without myelopathy or radiculopathy, cervical region: Secondary | ICD-10-CM | POA: Diagnosis not present

## 2018-09-15 DIAGNOSIS — G894 Chronic pain syndrome: Secondary | ICD-10-CM | POA: Diagnosis not present

## 2018-09-28 DIAGNOSIS — I10 Essential (primary) hypertension: Secondary | ICD-10-CM | POA: Diagnosis not present

## 2018-09-28 DIAGNOSIS — E1142 Type 2 diabetes mellitus with diabetic polyneuropathy: Secondary | ICD-10-CM | POA: Diagnosis not present

## 2018-10-08 DIAGNOSIS — M47812 Spondylosis without myelopathy or radiculopathy, cervical region: Secondary | ICD-10-CM | POA: Diagnosis not present

## 2018-10-08 DIAGNOSIS — R251 Tremor, unspecified: Secondary | ICD-10-CM | POA: Insufficient documentation

## 2018-10-29 DIAGNOSIS — M47812 Spondylosis without myelopathy or radiculopathy, cervical region: Secondary | ICD-10-CM | POA: Diagnosis not present

## 2018-10-29 DIAGNOSIS — M549 Dorsalgia, unspecified: Secondary | ICD-10-CM | POA: Diagnosis not present

## 2018-10-29 DIAGNOSIS — Z6827 Body mass index (BMI) 27.0-27.9, adult: Secondary | ICD-10-CM | POA: Insufficient documentation

## 2018-10-29 DIAGNOSIS — E1142 Type 2 diabetes mellitus with diabetic polyneuropathy: Secondary | ICD-10-CM | POA: Diagnosis not present

## 2018-10-29 DIAGNOSIS — I10 Essential (primary) hypertension: Secondary | ICD-10-CM | POA: Diagnosis not present

## 2018-11-07 ENCOUNTER — Ambulatory Visit: Payer: Medicare HMO | Admitting: Urology

## 2018-11-25 ENCOUNTER — Ambulatory Visit (INDEPENDENT_AMBULATORY_CARE_PROVIDER_SITE_OTHER): Payer: Medicare HMO | Admitting: Neurology

## 2018-11-25 ENCOUNTER — Encounter: Payer: Self-pay | Admitting: Neurology

## 2018-11-25 ENCOUNTER — Other Ambulatory Visit: Payer: Self-pay

## 2018-11-25 VITALS — BP 141/81 | HR 51 | Temp 97.9°F | Ht 71.0 in | Wt 203.0 lb

## 2018-11-25 DIAGNOSIS — R251 Tremor, unspecified: Secondary | ICD-10-CM | POA: Diagnosis not present

## 2018-11-25 NOTE — Progress Notes (Signed)
Subjective:    Patient ID: [name] is a 78 y.o. male.  HPI     Star Age, MD, PhD [facility] [address] [place], [place] [address]  Dear Dr. Maryjean Ka,   I saw your patient, [name], plan you can request in my neurologic clinic today for initial consultation of his tremor.  The patient is unaccompanied today.  As you know, [name] is a 78 year old left-handed gentleman with an underlying medical history of degenerative disc disease, cervical spondylosis with status post that his neck injection, diabetes, history of DVT, hypertension, hyperlipidemia and obesity, who reports an intermittent tremor in both hands, left more than right for the past 2 months.  He denies a family history of tremors.  I reviewed your office note from 10/08/2018.  He received a cervical spine injection at the time.  He reports epidural steroid injection helped for about 2 weeks.  He has another appointment for a procedure coming up.  He is the third youngest of a total of 10 siblings, all his 4 brothers passed away.  He had 5 sisters, 2 passed away.  Neither sibling had a tremor, he had 3 sons, lost 1 at the age of 45 due to a car accident.  He does not drink a whole lot of water, admits to drinking less than 1 cup of water per day on average, drinks sips of water to get his medicines.  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  He reports that his primary care physician of 30 years has cautioned him repeatedly regarding his alcohol consumption and warned him regarding liver cirrhosis.  The patient is retired.  He is widowed, he lives alone.  He denies any falls.  The tremor does not bother him on a day-to-day basis, he noticed the tremor primarily when he tries to drink  something with the left hand.  His Past Medical History Is Significant For: Past Medical History:  Diagnosis Date  . Diabetes mellitus    A1c of 7 -12/2009  . DJD (degenerative joint disease)    left knee,cervical spine,lumbosacral spine  . DVT (deep venous thrombosis) ([place]) 12/2009   left lower extremity  following left TKA  . Hyperlipidemia   . Hypertension   . Obesity     His Past Surgical History Is Significant For: Past Surgical History:  Procedure Laterality Date  . CERVICAL DISCECTOMY     and fusion  . COLONOSCOPY  08/15/2011   Procedure: COLONOSCOPY;  Surgeon: Daneil Dolin, MD;  Location[facility];  Service: Endoscopy;  Laterality: N/A;  11:15 AM  . COLONOSCOPY W/ POLYPECTOMY  2003  . TOTAL KNEE ARTHROPLASTY  2011  . TOTAL KNEE ARTHROPLASTY  03/2008  . TOTAL SHOULDER ARTHROPLASTY  05/2009   right     His Family History Is Significant For: History reviewed. No pertinent family history.  His Social History Is Significant For: Social History   Socioeconomic History  . Marital status: Widowed    Spouse name: Not on file  . Number of children: 3  . Years of education: Not on file  . Highest education level: Not on file  Occupational History    Employer: RETIRED  Social Needs  . Financial resource strain: Not  on file  . Food insecurity    Worry: Not on file    Inability: Not on file  . Transportation needs    Medical: Not on file    Non-medical: Not on file  Tobacco Use  . Smoking status: Never Smoker  . Smokeless tobacco: Never Used  Substance and Sexual Activity  . Alcohol use: No  . Drug use: No  . Sexual activity: Not on file  Lifestyle  . Physical activity    Days per week: Not on file    Minutes per session: Not on file  . Stress: Not on file  Relationships  . Social Herbalist on phone: Not on file    Gets together: Not on file    Attends religious service: Not on file    Active member of club or organization: Not on file     Attends meetings of clubs or organizations: Not on file    Relationship status: Not on file  Other Topics Concern  . Not on file  Social History Narrative  . Not on file    His Allergies Are:  No Known Allergies:   His Current Medications Are:  Outpatient Encounter Medications as of 11/25/2018  Medication Sig  . Cholecalciferol (VITAMIN D3) 2000 UNITS TABS Take 1 tablet by mouth daily.    Marland Kitchen gabapentin (NEURONTIN) 600 MG tablet Take 600 mg by mouth 3 (three) times daily.   Marland Kitchen glipiZIDE (GLUCOTROL) 5 MG tablet Take 5 mg by mouth 2 (two) times daily before a meal. Pt takes one tablet in the AM   . losartan-hydrochlorothiazide (HYZAAR) 50-12.5 MG tablet Take 1 tablet by mouth daily.  . metFORMIN (GLUCOPHAGE) 500 MG tablet Take 500 mg by mouth 2 (two) times daily with a meal. Takes one tablet in the AM   . metoprolol tartrate (LOPRESSOR) 50 MG tablet Take 50 mg by mouth daily.  . Multiple Vitamins-Minerals (MULTIVITAMIN WITH MINERALS) tablet Take 1 tablet by mouth daily.    . simvastatin (ZOCOR) 40 MG tablet Take 40 mg by mouth at bedtime.    Salley Scarlet [facility]  Peripheral Neuropathy Cream- Bupivacaine 1%, Doxepin 3%, Gabapentin 6%, Pentoxifylline 3%, Topiramate 1% Apply 1-2 grams to affected area 3-4 times daily Qty. 120 gm 3 refills  . [DISCONTINUED] cephALEXin (KEFLEX) 500 MG capsule cephalexin 500 mg capsule  TK ONE C PO  QID  . [DISCONTINUED] glyBURIDE-metformin (GLUCOVANCE) 5-500 MG per tablet Take 1 tablet by mouth daily with breakfast.    . [DISCONTINUED] HYDROcodone-acetaminophen (NORCO) 10-325 MG tablet TK 1 T PO QID PRN  . [DISCONTINUED] Influenza vac split quadrivalent PF (FLUZONE QUADRIVALENT) 0.5 ML injection Fluzone Quad 2018-19(PF) 60 mcg(15 mcgx4)/0.5 mL intramuscular syringe  ADM 0.5ML IM UTD  . [DISCONTINUED] propranolol-hydrochlorothiazide (INDERIDE) 40-25 MG per tablet Take 1 tablet by mouth daily.     No facility-administered encounter medications on  file as of 11/25/2018.   :  Review of Systems:  Out of a complete 14 point review of systems, all are reviewed and negative with the exception of these symptoms as listed below: Review of Systems  Neurological:       Pt presents today to discuss his tremors. Pt is left handed and notices the tremors more in his left.    Objective:  Neurological Exam  Physical Exam Physical Examination:   Vitals:   11/25/18 1421  BP: (!) 141/81  Pulse: (!) 51  Temp: 97.9 F (36.6 C)    General Examination:  The patient is a very pleasant 78 y.o. male in no acute distress. He appears well-developed and well-nourished and well groomed.   HEENT: Normocephalic, atraumatic, pupils are equal, round and reactive to light, Extraocular tracking is preserved, face is symmetric, no facial masking noted.  Speech is clear without dysarthria or hypophonia.  Hearing is grossly intact, perhaps mildly impaired.  He has no lip, neck or jaw tremor, neck is supple but he does have decreased range of motion actively and passively.  He has no carotid bruits.  Airway examination reveals moderate mouth dryness and partial dentures on top, tongue protrudes centrally in palate elevates symmetrically.  He has several missing teeth on the bottom. He admits that so far he has only had 2 cups of coffee today.  Chest: Clear to auscultation without wheezing, rhonchi or crackles noted.  Heart: S1+S2+0, regular and normal without murmurs, rubs or gallops noted.   Abdomen: Soft, non-tender and non-distended with normal bowel sounds appreciated on auscultation.  Extremities: There is no pitting edema in the distal lower extremities bilaterally. Pedal pulses are intact.  Skin: Warm and dry with Chronic appearing changes on both hands in keeping with chronic sun exposure.  Musculoskeletal: exam reveals no obvious joint deformities, tenderness or joint swelling or erythema.   Neurologically:  Mental status: The patient is awake,  alert and oriented in all 4 spheres. His immediate and remote memory, attention, language skills and fund of knowledge are appropriate. There is no evidence of aphasia, agnosia, apraxia or anomia. Speech is clear with normal prosody and enunciation. Thought process is linear. Mood is normal and affect is normal.  Cranial nerves II - XII are as described above under HEENT exam. In addition: shoulder shrug is normal with equal shoulder height noted. Motor exam: Normal bulk, strength and tone is noted. There is no drift, resting tremor or rebound.  On 11/25/2018: On Archimedes spiral drawing he has slight trembling with the left hand, no trembling with the right hand, handwriting with the left hand which is his dominant hand Is not tremulous, not micrographic and legible. He has a minimal left upper extremity postural tremor, no action tremor, no intention tremor.  He has no tremor In the right upper extremity or both lower extremities. Reflexes are 1+ throughout, incl. Ankles. Babinski: Toes are flexor bilaterally. Fine motor skills and coordination: intact with normal finger taps, normal hand movements, normal rapid alternating patting, normal foot taps and normal foot agility.  Cerebellar testing: No dysmetria or intention tremor on finger to nose testing. Heel to shin is unremarkable bilaterally. There is no truncal or gait ataxia.  Sensory exam: intact to light touch, vibration, temperature sense in the upper and lower extremities.  Gait, station and balance: He stands easily. No veering to one side is noted. No leaning to one side is noted. Posture is age-appropriate and stance is narrow based. Gait shows normal stride length and normal pace. No problems turning are noted. No shuffling noted, preserved arm swing.  Assessment and Plan:   In summary, PELE STUMPO is a very pleasant 78 y.o.-year old male with an underlying medical history of degenerative disc disease, cervical spondylosis with status  post that his neck injection, diabetes, history of DVT, hypertension, hyperlipidemia and obesity, who Presents for evaluation of his hand tremors, he has noticed an intermittent tremor in the left hand.  He has not Been bothered by the tremor, noticed it primarily with holding something especially when he tried to drink something.  He does not have a telltale family history of essential tremor, his examination is also not showing any prominent tremor.  He certainly does not have any parkinsonism.  He is largely reassured in this regard.  He is advised that certain conditions or hormonal changes are triggers can cause temporary exacerbation of tremors.  He is advised that dehydration can cause tremors, sleep deprivation, certain medications including steroids and also blood sugar fluctuations.  He does not hydrate very well with water at all.  He estimates that he drinks less than 1 cup of water per day on average.  He is encouraged to drink more water, about 6 cups Per day if possible.  Furthermore, he is discouraged from drinking beer on a regular basis or any form of alcohol for that matter.  He is advised to drink less than 1 beer per day if possible especially since he is taking certain medications that interfere with alcohol including his gabapentin.  He is cautioned regarding alcohol intake for that reason as it can increase his fall risk.  He is in regular follow-up with his primary care physician.  He is advised to follow-up with his primary care physician and I would be happy to see him on an as-needed basis, from my end of things I did not suggest any new medications for tremor control.  I answered all his questions today and he was in agreement.  Thank you very much for allowing me to participate in the care of this nice patient. If I can be of any further assistance to you please do not hesitate to call me at [phone].  Sincerely,   Star Age, MD, PhD

## 2018-11-25 NOTE — Patient Instructions (Addendum)
You have a very slight tremor in the left hand today, no signs of parkinsonism, your history is also not suggestive of a hereditary tremor called essential tremor.  I do not recommend any new medications for tremor control.  Please try to hydrate better with water as you indicate that you do not even drink 1 glass of water per day on average, please try to drink about 6 cups of water per day, 8 ounce size each.  Please try to reduce your alcohol intake and limit yourself to less than 1 beer per day if possible as you may have an interaction with certain medications including the gabapentin that you are on.  This can put you at risk for balance issues and falls.   Please remember, that any kind of tremor may be exacerbated by anxiety, anger, nervousness, excitement, dehydration, sleep deprivation, by caffeine, and low blood sugar values or blood sugar fluctuations. Some medications, Even steroids can exacerbate tremors temporarily.   I would be happy to see you back as needed.

## 2018-11-28 DIAGNOSIS — M549 Dorsalgia, unspecified: Secondary | ICD-10-CM | POA: Diagnosis not present

## 2018-11-28 DIAGNOSIS — E1142 Type 2 diabetes mellitus with diabetic polyneuropathy: Secondary | ICD-10-CM | POA: Diagnosis not present

## 2018-12-08 DIAGNOSIS — M47812 Spondylosis without myelopathy or radiculopathy, cervical region: Secondary | ICD-10-CM | POA: Diagnosis not present

## 2018-12-12 ENCOUNTER — Ambulatory Visit: Payer: Medicare HMO | Admitting: Urology

## 2018-12-25 DIAGNOSIS — E1142 Type 2 diabetes mellitus with diabetic polyneuropathy: Secondary | ICD-10-CM | POA: Diagnosis not present

## 2018-12-25 DIAGNOSIS — I10 Essential (primary) hypertension: Secondary | ICD-10-CM | POA: Diagnosis not present

## 2018-12-25 DIAGNOSIS — M545 Low back pain: Secondary | ICD-10-CM | POA: Diagnosis not present

## 2018-12-25 DIAGNOSIS — J410 Simple chronic bronchitis: Secondary | ICD-10-CM | POA: Diagnosis not present

## 2019-01-07 DIAGNOSIS — M542 Cervicalgia: Secondary | ICD-10-CM | POA: Insufficient documentation

## 2019-01-07 DIAGNOSIS — M47812 Spondylosis without myelopathy or radiculopathy, cervical region: Secondary | ICD-10-CM | POA: Diagnosis not present

## 2019-01-12 DIAGNOSIS — Z5181 Encounter for therapeutic drug level monitoring: Secondary | ICD-10-CM | POA: Diagnosis not present

## 2019-01-12 DIAGNOSIS — M5030 Other cervical disc degeneration, unspecified cervical region: Secondary | ICD-10-CM | POA: Diagnosis not present

## 2019-01-12 DIAGNOSIS — Z79899 Other long term (current) drug therapy: Secondary | ICD-10-CM | POA: Diagnosis not present

## 2019-01-12 DIAGNOSIS — G894 Chronic pain syndrome: Secondary | ICD-10-CM | POA: Diagnosis not present

## 2019-01-12 DIAGNOSIS — M47812 Spondylosis without myelopathy or radiculopathy, cervical region: Secondary | ICD-10-CM | POA: Diagnosis not present

## 2019-01-24 DIAGNOSIS — E1142 Type 2 diabetes mellitus with diabetic polyneuropathy: Secondary | ICD-10-CM | POA: Diagnosis not present

## 2019-01-24 DIAGNOSIS — I10 Essential (primary) hypertension: Secondary | ICD-10-CM | POA: Diagnosis not present

## 2019-02-04 ENCOUNTER — Telehealth: Payer: Self-pay | Admitting: Neurology

## 2019-02-04 ENCOUNTER — Telehealth: Payer: Self-pay | Admitting: Urology

## 2019-02-04 NOTE — Telephone Encounter (Signed)
Per Dr. Jeffie Pollock last note in [facility] on 04/2018 pt does not need any further blood work. Pt called and notified.

## 2019-02-04 NOTE — Telephone Encounter (Signed)
I called pt about having shaking in his hands. He stated that he saw DR. Athar one time.I read him Dr.Athar recommendations of what to do.Pt stated he is still hydrated with 6 cups of water. I ask patient if he is limiting his beer to one per day.Pt stated he is drinking 5 to 6 beers per day. I advise pt to follow Dr.Athar recommendations to decrease his tremors. Pt stated he did not read the AVS all the way and would like a copy sent to him. I verified the pts address and mailed AVS to him. He will wait to r/s an appt and see if following all of Dr.Athar recommendations will work.

## 2019-02-04 NOTE — Telephone Encounter (Signed)
Pt called and LVM wanting someone to call him back to discuss the shaking that he is having in his hands. Please advise.

## 2019-02-04 NOTE — Telephone Encounter (Signed)
Patient called the office today requesting a lab order to get labs drawn prior to his appointment with Dr. Jeffie Pollock on 03/06/19.    He is going to stop by the office to pick up the order the week before his appointment.  Please print the order and put at the front desk for the patient to pick up.  Thanks.

## 2019-02-23 DIAGNOSIS — M5030 Other cervical disc degeneration, unspecified cervical region: Secondary | ICD-10-CM | POA: Diagnosis not present

## 2019-02-23 DIAGNOSIS — M4722 Other spondylosis with radiculopathy, cervical region: Secondary | ICD-10-CM | POA: Diagnosis not present

## 2019-02-23 DIAGNOSIS — M47812 Spondylosis without myelopathy or radiculopathy, cervical region: Secondary | ICD-10-CM | POA: Diagnosis not present

## 2019-02-24 DIAGNOSIS — E1142 Type 2 diabetes mellitus with diabetic polyneuropathy: Secondary | ICD-10-CM | POA: Diagnosis not present

## 2019-02-24 DIAGNOSIS — M549 Dorsalgia, unspecified: Secondary | ICD-10-CM | POA: Diagnosis not present

## 2019-03-06 ENCOUNTER — Encounter: Payer: Self-pay | Admitting: Urology

## 2019-03-06 ENCOUNTER — Ambulatory Visit (INDEPENDENT_AMBULATORY_CARE_PROVIDER_SITE_OTHER): Payer: Medicare HMO | Admitting: Urology

## 2019-03-06 ENCOUNTER — Other Ambulatory Visit: Payer: Self-pay

## 2019-03-06 VITALS — BP 169/83 | HR 69 | Temp 97.3°F | Ht 71.0 in | Wt 200.0 lb

## 2019-03-06 DIAGNOSIS — N401 Enlarged prostate with lower urinary tract symptoms: Secondary | ICD-10-CM

## 2019-03-06 DIAGNOSIS — R351 Nocturia: Secondary | ICD-10-CM | POA: Diagnosis not present

## 2019-03-06 DIAGNOSIS — N138 Other obstructive and reflux uropathy: Secondary | ICD-10-CM | POA: Diagnosis not present

## 2019-03-06 LAB — BLADDER SCAN: Scan Result: 140.8

## 2019-03-06 NOTE — Progress Notes (Signed)
Subjective:  1. BPH with urinary obstruction   2. Nocturia      My PSA is elevated above the normal range. HPI: His PSA is 4.6. He has had PSA's drawn prior to this one. He has had elevated PSA's prior to this one.   [name] is a 79 yo WM who originally sent in consultation by Dr. Legrand Rams for an elevated PSA of 4.6 in July 2-19. I got a repeat at his visit in 9/19 and it was back down to 2.9 where it was in 5/18.  Because of his age further testing was not recommended at the time of his last visit in 3/20.   He is currently on losartan/HCTZ and metoprolol and since he has been having increased frequency on those meds.  He drinks 4-6 beers daily.   His IPSS is up to 9 from 3.   He has nocturia x 3 and some frequency.   He has no erectile dysfunction.   He has no associated signs or symptoms.   He hasn't been able to get a UA yet.      ROS:  ROS:  A complete review of systems was performed.  All systems are negative except for pertinent findings as noted.   Review of Systems  Genitourinary: Positive for frequency.  Musculoskeletal: Positive for back pain and neck pain.    No Known Allergies  Outpatient Encounter Medications as of 03/06/2019  Medication Sig  . Cholecalciferol (VITAMIN D3) 2000 UNITS TABS Take 1 tablet by mouth daily.    Marland Kitchen gabapentin (NEURONTIN) 600 MG tablet Take 600 mg by mouth 3 (three) times daily.   Marland Kitchen glipiZIDE (GLUCOTROL) 5 MG tablet Take 5 mg by mouth 2 (two) times daily before a meal. Pt takes one tablet in the AM   . losartan-hydrochlorothiazide (HYZAAR) 50-12.5 MG tablet Take 1 tablet by mouth daily.  . metFORMIN (GLUCOPHAGE) 500 MG tablet Take 500 mg by mouth 2 (two) times daily with a meal. Takes one tablet in the AM   . metoprolol tartrate (LOPRESSOR) 50 MG tablet Take 50 mg by mouth daily.  . Multiple Vitamins-Minerals (MULTIVITAMIN WITH MINERALS) tablet Take 1 tablet by mouth daily.    . simvastatin (ZOCOR) 40 MG tablet Take 40 mg by mouth at bedtime.     Salley Scarlet [facility]  Peripheral Neuropathy Cream- Bupivacaine 1%, Doxepin 3%, Gabapentin 6%, Pentoxifylline 3%, Topiramate 1% Apply 1-2 grams to affected area 3-4 times daily Qty. 120 gm 3 refills   No facility-administered encounter medications on file as of 03/06/2019.    Past Medical History:  Diagnosis Date  . Diabetes mellitus    A1c of 7 -12/2009  . DJD (degenerative joint disease)    left knee,cervical spine,lumbosacral spine  . DVT (deep venous thrombosis) ([place]) 12/2009   left lower extremity  following left TKA  . Hyperlipidemia   . Hypertension   . Obesity     Past Surgical History:  Procedure Laterality Date  . CERVICAL DISCECTOMY     and fusion  . COLONOSCOPY  08/15/2011   Procedure: COLONOSCOPY;  Surgeon: Daneil Dolin, MD;  [facility];  Service: Endoscopy;  Laterality: N/A;  11:15 AM  . COLONOSCOPY W/ POLYPECTOMY  2003  . TOTAL KNEE ARTHROPLASTY  2011  . TOTAL KNEE ARTHROPLASTY  03/2008  . TOTAL SHOULDER ARTHROPLASTY  05/2009   right     Social History   Socioeconomic History  . Marital status: Widowed    Spouse  name: Not on file  . Number of children: 3  . Years of education: Not on file  . Highest education level: Not on file  Occupational History    Employer: RETIRED  Tobacco Use  . Smoking status: Never Smoker  . Smokeless tobacco: Never Used  Substance and Sexual Activity  . Alcohol use: No  . Drug use: No  . Sexual activity: Not on file  Other Topics Concern  . Not on file  Social History Narrative  . Not on file   Social Determinants of Health   Financial Resource Strain:   . Difficulty of Paying Living Expenses: Not on file  Food Insecurity:   . Worried About Charity fundraiser in the Last Year: Not on file  . Ran Out of Food in the Last Year: Not on file  Transportation Needs:   . Lack of Transportation (Medical): Not on file  . Lack of Transportation (Non-Medical): Not on file  Physical Activity:    . Days of Exercise per Week: Not on file  . Minutes of Exercise per Session: Not on file  Stress:   . Feeling of Stress : Not on file  Social Connections:   . Frequency of Communication with Friends and Family: Not on file  . Frequency of Social Gatherings with Friends and Family: Not on file  . Attends Religious Services: Not on file  . Active Member of Clubs or Organizations: Not on file  . Attends Archivist Meetings: Not on file  . Marital Status: Not on file  Intimate Partner Violence:   . Fear of Current or Ex-Partner: Not on file  . Emotionally Abused: Not on file  . Physically Abused: Not on file  . Sexually Abused: Not on file    History reviewed. No pertinent family history.     Objective: Vitals:   03/06/19 1513  BP: (!) 169/83  Pulse: 69  Temp: (!) 97.3 F (36.3 C)     Physical Exam  Lab Results:  No results found for this or any previous visit (from the past 24 hour(s)).  BMET No results for input(s): NA, K, CL, CO2, GLUCOSE, BUN, CREATININE, CALCIUM in the last 72 hours. PSA No results found for: PSA No results found for: TESTOSTERONE    Studies/Results: PVR is 141ml on [place] today.     Assessment & Plan: Nocturia with BPH with BOO and incomplete emptying.   I discussed adding an alpha blocker but recommended he cut back on the beer and see if Dr. Legrand Rams can potentially eliminate the HCTZ.  F/U in 4 month for flowrate and PVR.    No orders of the defined types were placed in this encounter.    Orders Placed This Encounter  Procedures  . Bladder scan      Return in about 4 months (around 07/04/2019) for flowrate and PVR.   CC: Rosita Fire, MD      Irine Seal 03/06/2019

## 2019-03-27 DIAGNOSIS — I10 Essential (primary) hypertension: Secondary | ICD-10-CM | POA: Diagnosis not present

## 2019-03-27 DIAGNOSIS — E1142 Type 2 diabetes mellitus with diabetic polyneuropathy: Secondary | ICD-10-CM | POA: Diagnosis not present

## 2019-04-24 DIAGNOSIS — M8588 Other specified disorders of bone density and structure, other site: Secondary | ICD-10-CM | POA: Diagnosis not present

## 2019-04-24 DIAGNOSIS — M2578 Osteophyte, vertebrae: Secondary | ICD-10-CM | POA: Diagnosis not present

## 2019-04-24 DIAGNOSIS — M4322 Fusion of spine, cervical region: Secondary | ICD-10-CM | POA: Diagnosis not present

## 2019-04-24 DIAGNOSIS — M549 Dorsalgia, unspecified: Secondary | ICD-10-CM | POA: Diagnosis not present

## 2019-04-24 DIAGNOSIS — M542 Cervicalgia: Secondary | ICD-10-CM | POA: Diagnosis not present

## 2019-04-24 DIAGNOSIS — I6529 Occlusion and stenosis of unspecified carotid artery: Secondary | ICD-10-CM | POA: Diagnosis not present

## 2019-04-24 DIAGNOSIS — I10 Essential (primary) hypertension: Secondary | ICD-10-CM | POA: Diagnosis not present

## 2019-04-24 DIAGNOSIS — M5031 Other cervical disc degeneration,  high cervical region: Secondary | ICD-10-CM | POA: Diagnosis not present

## 2019-05-11 DIAGNOSIS — M5030 Other cervical disc degeneration, unspecified cervical region: Secondary | ICD-10-CM | POA: Diagnosis not present

## 2019-05-11 DIAGNOSIS — G894 Chronic pain syndrome: Secondary | ICD-10-CM | POA: Diagnosis not present

## 2019-05-13 DIAGNOSIS — M542 Cervicalgia: Secondary | ICD-10-CM | POA: Diagnosis not present

## 2019-05-13 DIAGNOSIS — M4802 Spinal stenosis, cervical region: Secondary | ICD-10-CM | POA: Diagnosis not present

## 2019-05-14 DIAGNOSIS — C44319 Basal cell carcinoma of skin of other parts of face: Secondary | ICD-10-CM | POA: Diagnosis not present

## 2019-05-14 DIAGNOSIS — L570 Actinic keratosis: Secondary | ICD-10-CM | POA: Diagnosis not present

## 2019-05-14 DIAGNOSIS — L308 Other specified dermatitis: Secondary | ICD-10-CM | POA: Diagnosis not present

## 2019-05-14 DIAGNOSIS — X32XXXD Exposure to sunlight, subsequent encounter: Secondary | ICD-10-CM | POA: Diagnosis not present

## 2019-05-14 DIAGNOSIS — D0461 Carcinoma in situ of skin of right upper limb, including shoulder: Secondary | ICD-10-CM | POA: Diagnosis not present

## 2019-05-14 DIAGNOSIS — L821 Other seborrheic keratosis: Secondary | ICD-10-CM | POA: Diagnosis not present

## 2019-05-14 DIAGNOSIS — D225 Melanocytic nevi of trunk: Secondary | ICD-10-CM | POA: Diagnosis not present

## 2019-05-25 DIAGNOSIS — F329 Major depressive disorder, single episode, unspecified: Secondary | ICD-10-CM | POA: Diagnosis not present

## 2019-05-25 DIAGNOSIS — E1142 Type 2 diabetes mellitus with diabetic polyneuropathy: Secondary | ICD-10-CM | POA: Diagnosis not present

## 2019-05-27 DIAGNOSIS — M47812 Spondylosis without myelopathy or radiculopathy, cervical region: Secondary | ICD-10-CM | POA: Diagnosis not present

## 2019-05-27 DIAGNOSIS — M542 Cervicalgia: Secondary | ICD-10-CM | POA: Diagnosis not present

## 2019-05-27 DIAGNOSIS — M2578 Osteophyte, vertebrae: Secondary | ICD-10-CM | POA: Diagnosis not present

## 2019-05-27 DIAGNOSIS — M5021 Other cervical disc displacement,  high cervical region: Secondary | ICD-10-CM | POA: Diagnosis not present

## 2019-06-15 DIAGNOSIS — Z1331 Encounter for screening for depression: Secondary | ICD-10-CM | POA: Diagnosis not present

## 2019-06-15 DIAGNOSIS — E1165 Type 2 diabetes mellitus with hyperglycemia: Secondary | ICD-10-CM | POA: Diagnosis not present

## 2019-06-15 DIAGNOSIS — E1142 Type 2 diabetes mellitus with diabetic polyneuropathy: Secondary | ICD-10-CM | POA: Diagnosis not present

## 2019-06-15 DIAGNOSIS — Z1389 Encounter for screening for other disorder: Secondary | ICD-10-CM | POA: Diagnosis not present

## 2019-06-15 DIAGNOSIS — I10 Essential (primary) hypertension: Secondary | ICD-10-CM | POA: Diagnosis not present

## 2019-06-15 DIAGNOSIS — J410 Simple chronic bronchitis: Secondary | ICD-10-CM | POA: Diagnosis not present

## 2019-06-15 DIAGNOSIS — Z0001 Encounter for general adult medical examination with abnormal findings: Secondary | ICD-10-CM | POA: Diagnosis not present

## 2019-06-19 DIAGNOSIS — I10 Essential (primary) hypertension: Secondary | ICD-10-CM | POA: Diagnosis not present

## 2019-06-19 DIAGNOSIS — E119 Type 2 diabetes mellitus without complications: Secondary | ICD-10-CM | POA: Diagnosis not present

## 2019-06-19 DIAGNOSIS — E785 Hyperlipidemia, unspecified: Secondary | ICD-10-CM | POA: Diagnosis not present

## 2019-06-19 DIAGNOSIS — M542 Cervicalgia: Secondary | ICD-10-CM | POA: Diagnosis not present

## 2019-06-19 DIAGNOSIS — M47816 Spondylosis without myelopathy or radiculopathy, lumbar region: Secondary | ICD-10-CM | POA: Diagnosis not present

## 2019-06-19 DIAGNOSIS — G8929 Other chronic pain: Secondary | ICD-10-CM | POA: Diagnosis not present

## 2019-06-29 DIAGNOSIS — M542 Cervicalgia: Secondary | ICD-10-CM | POA: Diagnosis not present

## 2019-07-10 ENCOUNTER — Ambulatory Visit: Payer: Medicare HMO | Admitting: Urology

## 2019-07-16 DIAGNOSIS — I10 Essential (primary) hypertension: Secondary | ICD-10-CM | POA: Diagnosis not present

## 2019-07-16 DIAGNOSIS — E1142 Type 2 diabetes mellitus with diabetic polyneuropathy: Secondary | ICD-10-CM | POA: Diagnosis not present

## 2019-07-21 DIAGNOSIS — M47812 Spondylosis without myelopathy or radiculopathy, cervical region: Secondary | ICD-10-CM | POA: Diagnosis not present

## 2019-08-03 DIAGNOSIS — M542 Cervicalgia: Secondary | ICD-10-CM | POA: Diagnosis not present

## 2019-08-04 DIAGNOSIS — H524 Presbyopia: Secondary | ICD-10-CM | POA: Diagnosis not present

## 2019-08-14 ENCOUNTER — Ambulatory Visit: Payer: Medicare HMO | Admitting: Urology

## 2019-08-19 DIAGNOSIS — M47812 Spondylosis without myelopathy or radiculopathy, cervical region: Secondary | ICD-10-CM | POA: Diagnosis not present

## 2019-09-02 DIAGNOSIS — Z09 Encounter for follow-up examination after completed treatment for conditions other than malignant neoplasm: Secondary | ICD-10-CM | POA: Diagnosis not present

## 2019-09-02 DIAGNOSIS — M542 Cervicalgia: Secondary | ICD-10-CM | POA: Diagnosis not present

## 2019-09-03 DIAGNOSIS — M5416 Radiculopathy, lumbar region: Secondary | ICD-10-CM | POA: Diagnosis not present

## 2019-09-03 DIAGNOSIS — M47812 Spondylosis without myelopathy or radiculopathy, cervical region: Secondary | ICD-10-CM | POA: Diagnosis not present

## 2019-09-03 DIAGNOSIS — M5030 Other cervical disc degeneration, unspecified cervical region: Secondary | ICD-10-CM | POA: Diagnosis not present

## 2019-09-03 DIAGNOSIS — M5412 Radiculopathy, cervical region: Secondary | ICD-10-CM | POA: Diagnosis not present

## 2019-09-03 DIAGNOSIS — G894 Chronic pain syndrome: Secondary | ICD-10-CM | POA: Diagnosis not present

## 2019-09-15 DIAGNOSIS — E1142 Type 2 diabetes mellitus with diabetic polyneuropathy: Secondary | ICD-10-CM | POA: Diagnosis not present

## 2019-09-15 DIAGNOSIS — I10 Essential (primary) hypertension: Secondary | ICD-10-CM | POA: Diagnosis not present

## 2019-09-17 DIAGNOSIS — D0439 Carcinoma in situ of skin of other parts of face: Secondary | ICD-10-CM | POA: Diagnosis not present

## 2019-09-17 DIAGNOSIS — X32XXXD Exposure to sunlight, subsequent encounter: Secondary | ICD-10-CM | POA: Diagnosis not present

## 2019-09-17 DIAGNOSIS — Z85828 Personal history of other malignant neoplasm of skin: Secondary | ICD-10-CM | POA: Diagnosis not present

## 2019-09-17 DIAGNOSIS — Z08 Encounter for follow-up examination after completed treatment for malignant neoplasm: Secondary | ICD-10-CM | POA: Diagnosis not present

## 2019-09-17 DIAGNOSIS — L570 Actinic keratosis: Secondary | ICD-10-CM | POA: Diagnosis not present

## 2019-10-08 DIAGNOSIS — M5030 Other cervical disc degeneration, unspecified cervical region: Secondary | ICD-10-CM | POA: Diagnosis not present

## 2019-10-14 DIAGNOSIS — Z981 Arthrodesis status: Secondary | ICD-10-CM | POA: Diagnosis not present

## 2019-10-14 DIAGNOSIS — M47812 Spondylosis without myelopathy or radiculopathy, cervical region: Secondary | ICD-10-CM | POA: Diagnosis not present

## 2019-10-16 DIAGNOSIS — I10 Essential (primary) hypertension: Secondary | ICD-10-CM | POA: Diagnosis not present

## 2019-10-16 DIAGNOSIS — E1142 Type 2 diabetes mellitus with diabetic polyneuropathy: Secondary | ICD-10-CM | POA: Diagnosis not present

## 2019-10-29 DIAGNOSIS — Z08 Encounter for follow-up examination after completed treatment for malignant neoplasm: Secondary | ICD-10-CM | POA: Diagnosis not present

## 2019-10-29 DIAGNOSIS — Z85828 Personal history of other malignant neoplasm of skin: Secondary | ICD-10-CM | POA: Diagnosis not present

## 2019-10-29 DIAGNOSIS — S90862A Insect bite (nonvenomous), left foot, initial encounter: Secondary | ICD-10-CM | POA: Diagnosis not present

## 2019-10-29 DIAGNOSIS — X32XXXD Exposure to sunlight, subsequent encounter: Secondary | ICD-10-CM | POA: Diagnosis not present

## 2019-10-29 DIAGNOSIS — L570 Actinic keratosis: Secondary | ICD-10-CM | POA: Diagnosis not present

## 2019-11-15 DIAGNOSIS — I10 Essential (primary) hypertension: Secondary | ICD-10-CM | POA: Diagnosis not present

## 2019-11-15 DIAGNOSIS — E1142 Type 2 diabetes mellitus with diabetic polyneuropathy: Secondary | ICD-10-CM | POA: Diagnosis not present

## 2019-12-16 DIAGNOSIS — I10 Essential (primary) hypertension: Secondary | ICD-10-CM | POA: Diagnosis not present

## 2019-12-16 DIAGNOSIS — E1142 Type 2 diabetes mellitus with diabetic polyneuropathy: Secondary | ICD-10-CM | POA: Diagnosis not present

## 2019-12-25 ENCOUNTER — Ambulatory Visit (HOSPITAL_COMMUNITY)
Admission: RE | Admit: 2019-12-25 | Discharge: 2019-12-25 | Disposition: A | Discharge status: Home / Self Care | Payer: Medicare HMO | Source: Ambulatory Visit | Attending: Gerontology | Admitting: Gerontology

## 2019-12-25 ENCOUNTER — Other Ambulatory Visit: Payer: Self-pay

## 2019-12-25 ENCOUNTER — Other Ambulatory Visit (HOSPITAL_COMMUNITY): Payer: Self-pay | Admitting: Gerontology

## 2019-12-25 DIAGNOSIS — R059 Cough, unspecified: Secondary | ICD-10-CM

## 2019-12-25 DIAGNOSIS — E1142 Type 2 diabetes mellitus with diabetic polyneuropathy: Secondary | ICD-10-CM | POA: Diagnosis not present

## 2019-12-25 DIAGNOSIS — J410 Simple chronic bronchitis: Secondary | ICD-10-CM | POA: Diagnosis not present

## 2019-12-25 DIAGNOSIS — I10 Essential (primary) hypertension: Secondary | ICD-10-CM | POA: Diagnosis not present

## 2020-01-02 ENCOUNTER — Other Ambulatory Visit: Payer: Self-pay

## 2020-01-02 ENCOUNTER — Inpatient Hospital Stay (HOSPITAL_COMMUNITY)
Admission: EM | Admit: 2020-01-02 | Discharge: 2020-01-04 | DRG: 177 | Disposition: A | Discharge status: Home / Self Care | Payer: Medicare HMO | Attending: Internal Medicine | Admitting: Internal Medicine

## 2020-01-02 ENCOUNTER — Encounter (HOSPITAL_COMMUNITY): Payer: Self-pay | Admitting: *Deleted

## 2020-01-02 ENCOUNTER — Emergency Department (HOSPITAL_COMMUNITY): Payer: Medicare HMO

## 2020-01-02 DIAGNOSIS — Z981 Arthrodesis status: Secondary | ICD-10-CM | POA: Diagnosis not present

## 2020-01-02 DIAGNOSIS — J9601 Acute respiratory failure with hypoxia: Secondary | ICD-10-CM | POA: Diagnosis not present

## 2020-01-02 DIAGNOSIS — Z79899 Other long term (current) drug therapy: Secondary | ICD-10-CM

## 2020-01-02 DIAGNOSIS — U071 COVID-19: Secondary | ICD-10-CM | POA: Diagnosis not present

## 2020-01-02 DIAGNOSIS — E869 Volume depletion, unspecified: Secondary | ICD-10-CM | POA: Diagnosis present

## 2020-01-02 DIAGNOSIS — R059 Cough, unspecified: Secondary | ICD-10-CM | POA: Diagnosis not present

## 2020-01-02 DIAGNOSIS — Z96653 Presence of artificial knee joint, bilateral: Secondary | ICD-10-CM | POA: Diagnosis present

## 2020-01-02 DIAGNOSIS — J1282 Pneumonia due to coronavirus disease 2019: Secondary | ICD-10-CM | POA: Diagnosis present

## 2020-01-02 DIAGNOSIS — Z7984 Long term (current) use of oral hypoglycemic drugs: Secondary | ICD-10-CM | POA: Diagnosis not present

## 2020-01-02 DIAGNOSIS — N400 Enlarged prostate without lower urinary tract symptoms: Secondary | ICD-10-CM | POA: Diagnosis not present

## 2020-01-02 DIAGNOSIS — J9600 Acute respiratory failure, unspecified whether with hypoxia or hypercapnia: Secondary | ICD-10-CM | POA: Diagnosis not present

## 2020-01-02 DIAGNOSIS — J189 Pneumonia, unspecified organism: Secondary | ICD-10-CM | POA: Diagnosis not present

## 2020-01-02 DIAGNOSIS — I82409 Acute embolism and thrombosis of unspecified deep veins of unspecified lower extremity: Secondary | ICD-10-CM | POA: Diagnosis present

## 2020-01-02 DIAGNOSIS — E1140 Type 2 diabetes mellitus with diabetic neuropathy, unspecified: Secondary | ICD-10-CM | POA: Diagnosis present

## 2020-01-02 DIAGNOSIS — G894 Chronic pain syndrome: Secondary | ICD-10-CM | POA: Diagnosis present

## 2020-01-02 DIAGNOSIS — E871 Hypo-osmolality and hyponatremia: Secondary | ICD-10-CM | POA: Diagnosis present

## 2020-01-02 DIAGNOSIS — I10 Essential (primary) hypertension: Secondary | ICD-10-CM | POA: Diagnosis not present

## 2020-01-02 DIAGNOSIS — Z96611 Presence of right artificial shoulder joint: Secondary | ICD-10-CM | POA: Diagnosis present

## 2020-01-02 DIAGNOSIS — E1169 Type 2 diabetes mellitus with other specified complication: Secondary | ICD-10-CM

## 2020-01-02 DIAGNOSIS — E876 Hypokalemia: Secondary | ICD-10-CM | POA: Diagnosis present

## 2020-01-02 DIAGNOSIS — Z86718 Personal history of other venous thrombosis and embolism: Secondary | ICD-10-CM | POA: Diagnosis not present

## 2020-01-02 DIAGNOSIS — E785 Hyperlipidemia, unspecified: Secondary | ICD-10-CM | POA: Diagnosis present

## 2020-01-02 DIAGNOSIS — E119 Type 2 diabetes mellitus without complications: Secondary | ICD-10-CM

## 2020-01-02 LAB — CBC WITH DIFFERENTIAL/PLATELET
Abs Immature Granulocytes: 0.09 10*3/uL — ABNORMAL HIGH (ref 0.00–0.07)
Basophils Absolute: 0 10*3/uL (ref 0.0–0.1)
Basophils Relative: 0 %
Eosinophils Absolute: 0.2 10*3/uL (ref 0.0–0.5)
Eosinophils Relative: 3 %
HCT: 40.2 % (ref 39.0–52.0)
Hemoglobin: 14.2 g/dL (ref 13.0–17.0)
Immature Granulocytes: 1 %
Lymphocytes Relative: 11 %
Lymphs Abs: 0.9 10*3/uL (ref 0.7–4.0)
MCH: 30.9 pg (ref 26.0–34.0)
MCHC: 35.3 g/dL (ref 30.0–36.0)
MCV: 87.6 fL (ref 80.0–100.0)
Monocytes Absolute: 0.8 10*3/uL (ref 0.1–1.0)
Monocytes Relative: 10 %
Neutro Abs: 6.2 10*3/uL (ref 1.7–7.7)
Neutrophils Relative %: 75 %
Platelets: 224 10*3/uL (ref 150–400)
RBC: 4.59 MIL/uL (ref 4.22–5.81)
RDW: 12.8 % (ref 11.5–15.5)
WBC: 8.2 10*3/uL (ref 4.0–10.5)
nRBC: 0 % (ref 0.0–0.2)

## 2020-01-02 LAB — COMPREHENSIVE METABOLIC PANEL
ALT: 18 U/L (ref 0–44)
AST: 25 U/L (ref 15–41)
Albumin: 3.2 g/dL — ABNORMAL LOW (ref 3.5–5.0)
Alkaline Phosphatase: 53 U/L (ref 38–126)
Anion gap: 12 (ref 5–15)
BUN: 20 mg/dL (ref 8–23)
CO2: 25 mmol/L (ref 22–32)
Calcium: 8.8 mg/dL — ABNORMAL LOW (ref 8.9–10.3)
Chloride: 95 mmol/L — ABNORMAL LOW (ref 98–111)
Creatinine, Ser: 0.85 mg/dL (ref 0.61–1.24)
GFR, Estimated: >60 mL/min (ref >60)
Glucose, Bld: 260 mg/dL — ABNORMAL HIGH (ref 70–99)
Potassium: 3.4 mmol/L — ABNORMAL LOW (ref 3.5–5.1)
Sodium: 132 mmol/L — ABNORMAL LOW (ref 135–145)
Total Bilirubin: 1 mg/dL (ref 0.3–1.2)
Total Protein: 6.9 g/dL (ref 6.5–8.1)

## 2020-01-02 LAB — TRIGLYCERIDES: Triglycerides: 161 mg/dL — ABNORMAL HIGH (ref <150)

## 2020-01-02 LAB — LACTATE DEHYDROGENASE: LDH: 158 U/L (ref 98–192)

## 2020-01-02 LAB — RESP PANEL BY RT-PCR (FLU A&B, COVID) ARPGX2
Influenza A by PCR: NEGATIVE
Influenza B by PCR: NEGATIVE
SARS Coronavirus 2 by RT PCR: POSITIVE — AB

## 2020-01-02 LAB — MAGNESIUM: Magnesium: 1.6 mg/dL — ABNORMAL LOW (ref 1.7–2.4)

## 2020-01-02 LAB — D-DIMER, QUANTITATIVE: D-Dimer, Quant: 1.54 ug/mL-FEU — ABNORMAL HIGH (ref 0.00–0.50)

## 2020-01-02 LAB — LACTIC ACID, PLASMA: Lactic Acid, Venous: 1.7 mmol/L (ref 0.5–1.9)

## 2020-01-02 LAB — FERRITIN: Ferritin: 715 ng/mL — ABNORMAL HIGH (ref 24–336)

## 2020-01-02 LAB — CBG MONITORING, ED: Glucose-Capillary: 256 mg/dL — ABNORMAL HIGH (ref 70–99)

## 2020-01-02 LAB — C-REACTIVE PROTEIN: CRP: 26.5 mg/dL — ABNORMAL HIGH (ref <1.0)

## 2020-01-02 LAB — FIBRINOGEN: Fibrinogen: 773 mg/dL — ABNORMAL HIGH (ref 210–475)

## 2020-01-02 LAB — PROCALCITONIN: Procalcitonin: 0.11 ng/mL

## 2020-01-02 MED ORDER — HYDROCHLOROTHIAZIDE 12.5 MG PO CAPS: 12.5000 mg | ORAL_CAPSULE | Freq: Every day | ORAL | Status: DC

## 2020-01-02 MED ORDER — INSULIN ASPART 100 UNIT/ML ~~LOC~~ SOLN
0.0000 [IU] | Freq: Three times a day (TID) | SUBCUTANEOUS | Status: DC
  Administered 2020-01-03 – 2020-01-04 (×4): 11 [IU] via SUBCUTANEOUS
  Administered 2020-01-04: 15 [IU] via SUBCUTANEOUS
  Filled 2020-01-02 (×2): qty 1

## 2020-01-02 MED ORDER — ASCORBIC ACID 500 MG PO TABS
500.0000 mg | ORAL_TABLET | Freq: Every day | ORAL | Status: DC
  Administered 2020-01-03 – 2020-01-04 (×2): 500 mg via ORAL
  Filled 2020-01-02 (×2): qty 1

## 2020-01-02 MED ORDER — HYDROCODONE-ACETAMINOPHEN 10-325 MG PO TABS: 1.0000 | ORAL_TABLET | Freq: Four times a day (QID) | ORAL | Status: DC | PRN

## 2020-01-02 MED ORDER — POTASSIUM CHLORIDE CRYS ER 20 MEQ PO TBCR
40.0000 meq | EXTENDED_RELEASE_TABLET | Freq: Once | ORAL | Status: AC
  Administered 2020-01-02: 40 meq via ORAL
  Filled 2020-01-02: qty 2

## 2020-01-02 MED ORDER — SIMVASTATIN 20 MG PO TABS
40.0000 mg | ORAL_TABLET | Freq: Every day | ORAL | Status: DC
  Administered 2020-01-03: 40 mg via ORAL
  Filled 2020-01-02: qty 2

## 2020-01-02 MED ORDER — SODIUM CHLORIDE 0.9 % IV SOLN
100.0000 mg | Freq: Once | INTRAVENOUS | Status: AC
  Administered 2020-01-02: 100 mg via INTRAVENOUS
  Filled 2020-01-02: qty 20

## 2020-01-02 MED ORDER — LOSARTAN POTASSIUM 25 MG PO TABS: 50.0000 mg | ORAL_TABLET | Freq: Every day | ORAL | Status: DC

## 2020-01-02 MED ORDER — DEXAMETHASONE SODIUM PHOSPHATE 10 MG/ML IJ SOLN
10.0000 mg | Freq: Once | INTRAMUSCULAR | Status: AC
  Administered 2020-01-02: 10 mg via INTRAVENOUS
  Filled 2020-01-02: qty 1

## 2020-01-02 MED ORDER — ENOXAPARIN SODIUM 40 MG/0.4ML ~~LOC~~ SOLN
40.0000 mg | SUBCUTANEOUS | Status: DC
  Administered 2020-01-02 – 2020-01-03 (×2): 40 mg via SUBCUTANEOUS
  Filled 2020-01-02 (×2): qty 0.4

## 2020-01-02 MED ORDER — POLYETHYLENE GLYCOL 3350 17 G PO PACK
17.0000 g | PACK | Freq: Every day | ORAL | Status: DC | PRN
  Administered 2020-01-04: 17 g via ORAL
  Filled 2020-01-02: qty 1

## 2020-01-02 MED ORDER — GUAIFENESIN-DM 100-10 MG/5ML PO SYRP: 10.0000 mL | ORAL_SOLUTION | ORAL | Status: DC | PRN

## 2020-01-02 MED ORDER — ONDANSETRON HCL 4 MG PO TABS: 4.0000 mg | ORAL_TABLET | Freq: Four times a day (QID) | ORAL | Status: DC | PRN

## 2020-01-02 MED ORDER — METOPROLOL SUCCINATE ER 25 MG PO TB24: 50.0000 mg | ORAL_TABLET | Freq: Every day | ORAL | Status: DC

## 2020-01-02 MED ORDER — MECLIZINE HCL 12.5 MG PO TABS: 25.0000 mg | ORAL_TABLET | Freq: Two times a day (BID) | ORAL | Status: DC | PRN

## 2020-01-02 MED ORDER — GLIPIZIDE 5 MG PO TABS
5.0000 mg | ORAL_TABLET | Freq: Every day | ORAL | Status: DC
  Filled 2020-01-02 (×2): qty 1

## 2020-01-02 MED ORDER — LOSARTAN POTASSIUM-HCTZ 50-12.5 MG PO TABS: 1.0000 | ORAL_TABLET | Freq: Every day | ORAL | Status: DC

## 2020-01-02 MED ORDER — SODIUM CHLORIDE 0.9 % IV SOLN
100.0000 mg | Freq: Every day | INTRAVENOUS | Status: DC
  Administered 2020-01-03: 100 mg via INTRAVENOUS
  Filled 2020-01-02: qty 20

## 2020-01-02 MED ORDER — ZINC SULFATE 220 (50 ZN) MG PO CAPS
220.0000 mg | ORAL_CAPSULE | Freq: Every day | ORAL | Status: DC
  Administered 2020-01-03 – 2020-01-04 (×2): 220 mg via ORAL
  Filled 2020-01-02 (×3): qty 1

## 2020-01-02 MED ORDER — INSULIN ASPART 100 UNIT/ML ~~LOC~~ SOLN
0.0000 [IU] | Freq: Every day | SUBCUTANEOUS | Status: DC
  Administered 2020-01-02: 3 [IU] via SUBCUTANEOUS
  Administered 2020-01-03: 5 [IU] via SUBCUTANEOUS
  Filled 2020-01-02: qty 1

## 2020-01-02 MED ORDER — ALBUTEROL SULFATE HFA 108 (90 BASE) MCG/ACT IN AERS
2.0000 | INHALATION_SPRAY | Freq: Four times a day (QID) | RESPIRATORY_TRACT | Status: DC
  Administered 2020-01-03 – 2020-01-04 (×5): 2 via RESPIRATORY_TRACT
  Filled 2020-01-02 (×2): qty 6.7

## 2020-01-02 MED ORDER — ONDANSETRON HCL 4 MG/2ML IJ SOLN: 4.0000 mg | Freq: Four times a day (QID) | INTRAMUSCULAR | Status: DC | PRN

## 2020-01-02 MED ORDER — GABAPENTIN 400 MG PO CAPS
800.0000 mg | ORAL_CAPSULE | Freq: Three times a day (TID) | ORAL | Status: DC
  Administered 2020-01-02 – 2020-01-04 (×5): 800 mg via ORAL
  Filled 2020-01-02 (×5): qty 2

## 2020-01-02 MED ORDER — DEXAMETHASONE SODIUM PHOSPHATE 10 MG/ML IJ SOLN: 6.0000 mg | INTRAMUSCULAR | Status: DC

## 2020-01-02 NOTE — ED Notes (Signed)
Pt ambulatory in the room, pt desatted down to 79% on room air, pt back into bed and placed on 2L O2 via [place], pt satting 98% on 2L.  Pt on cardiac and O2 sat monitor.  Pt appears in sinus rhythm.

## 2020-01-02 NOTE — H&P (Addendum)
History and Physical    ORMAND SENN XKG:[id number] DOB: 10-03-1940 DOA: 01/02/2020  PCP: Rosita Fire, MD   Patient coming from: Home  I have personally briefly reviewed patient's old medical records in [place]  Chief Complaint: Cough  HPI: [name] is a 79 y.o. male with medical history significant for diabetes mellitus, hypertension, left LE DVT 2011 following left TKA. Patient presented to the ED with complaints of nonproductive cough ongoing for at least 2 weeks.  He denies difficulty breathing, no chest pain.  No leg swelling. Patient received 2 doses of [place] vaccine, first dose in March.  He planned to get his booster dose next month. Patient followed up with his primary care provider as an outpatient, he was given a course of steroids and Z-Pak which he took.  ED Course: O2 sats 90 to 91 % on room air, with ambulation O2 sat dropped to 79%.  Unremarkable CBC.  Sodium 132.  Potassium 3.4.  Elevation in some inflammatory markers ordered pending.  Portable chest x-ray shows patchy areas of airspace consolidation in bilateral lower lobes may represent atypical pneumonia.  With hypoxia in ED, hospitalist was called to admit for Covid pneumonia.  Review of Systems: As per HPI all other systems reviewed and negative.  Past Medical History:  Diagnosis Date  . Diabetes mellitus    A1c of 7 -12/2009  . DJD (degenerative joint disease)    left knee,cervical spine,lumbosacral spine  . DVT (deep venous thrombosis) ([place]) 12/2009   left lower extremity  following left TKA  . Hyperlipidemia   . Hypertension   . Obesity     Past Surgical History:  Procedure Laterality Date  . CERVICAL DISCECTOMY     and fusion  . COLONOSCOPY  08/15/2011   Procedure: COLONOSCOPY;  Surgeon: Daneil Dolin, MD;  [facility];  Service: Endoscopy;  Laterality: N/A;  11:15 AM  . COLONOSCOPY W/ POLYPECTOMY  2003  . TOTAL KNEE ARTHROPLASTY  2011  . TOTAL KNEE ARTHROPLASTY   03/2008  . TOTAL SHOULDER ARTHROPLASTY  05/2009   right      reports that he has never smoked. He has never used smokeless tobacco. He reports that he does not drink alcohol and does not use drugs.  No Known Allergies  Family history of hypertension mother, lung cancer father.   Prior to Admission medications   Medication Sig Start Date End Date Taking? Authorizing Provider  baclofen (LIORESAL) 10 MG tablet Take 1 tablet by mouth daily. 10/21/19  Yes [provider]  Cholecalciferol (VITAMIN D3) 2000 UNITS TABS Take 1 tablet by mouth daily.     Yes [provider]  gabapentin (NEURONTIN) 800 MG tablet Take 800 mg by mouth 3 (three) times daily. 11/28/19  Yes [provider]  glipiZIDE (GLUCOTROL) 5 MG tablet Take 5 mg by mouth 2 (two) times daily before a meal. Pt takes one tablet in the AM    Yes [provider]  HYDROcodone-acetaminophen (NORCO) 10-325 MG tablet Take 1 tablet by mouth 4 (four) times daily as needed. 12/04/19  Yes [provider]  ipratropium-albuterol (DUONEB) 0.5-2.5 (3) MG/3ML SOLN Take 0.5 mLs by nebulization 4 (four) times daily as needed. 12/25/19  Yes [provider]  losartan-hydrochlorothiazide (HYZAAR) 50-12.5 MG tablet Take 1 tablet by mouth daily.   Yes [provider]  meclizine (ANTIVERT) 25 MG tablet Take 25 mg by mouth 2 (two) times daily as needed for dizziness. 11/19/19  Yes  [provider]  metFORMIN (GLUCOPHAGE) 500 MG tablet Take 500 mg by mouth 2 (two) times daily with a meal. Takes one tablet in the AM    Yes [provider]  metoprolol succinate (TOPROL-XL) 50 MG 24 hr tablet Take 50 mg by mouth daily. 10/20/19  Yes [provider]  Multiple Vitamins-Minerals (MULTIVITAMIN WITH MINERALS) tablet Take 1 tablet by mouth daily.     Yes [provider]  NON [facility]ULARY [facility]  Peripheral Neuropathy Cream- Bupivacaine 1%, Doxepin 3%, Gabapentin 6%,  Pentoxifylline 3%, Topiramate 1% Apply 1-2 grams to affected area 3-4 times daily Qty. 120 gm 3 refills   Yes [provider]  simvastatin (ZOCOR) 40 MG tablet Take 40 mg by mouth at bedtime.     Yes [provider]  azithromycin (ZITHROMAX) 250 MG tablet Take 250 mg by mouth as directed. 12/26/19   [provider]    Physical Exam: Vitals:   01/02/20 1800 01/02/20 1830 01/02/20 1930 01/02/20 2000  BP: 117/72 (!) 115/99 (!) 156/81 138/73  Pulse: 77 96 90 84  Resp: (!) 26 16 19 18   Temp:   99 F (37.2 C)   TempSrc:   Oral   SpO2: 92% 91% 99% 97%  Weight:      Height:        Constitutional: NAD, calm, comfortable Vitals:   01/02/20 1800 01/02/20 1830 01/02/20 1930 01/02/20 2000  BP: 117/72 (!) 115/99 (!) 156/81 138/73  Pulse: 77 96 90 84  Resp: (!) 26 16 19 18   Temp:   99 F (37.2 C)   TempSrc:   Oral   SpO2: 92% 91% 99% 97%  Weight:      Height:       Eyes: Pupils equal, lids and conjunctivae normal ENMT: Mucous membranes are moist.   Neck: normal, supple, Respiratory: clear to auscultation bilaterally, no wheezing, no crackles. Normal respiratory effort. No accessory muscle use.  Cardiovascular: Regular rate and rhythm, no murmurs / rubs / gallops. No extremity edema. 2+ pedal pulses.  Abdomen: no tenderness, no masses palpated.  Musculoskeletal: no clubbing / cyanosis. No joint deformity upper and lower extremities. Good ROM, no contractures. Normal muscle tone.  Skin: no rashes, lesions, ulcers. No induration Neurologic: No apparent cranial normality, moving extremities spontaneously. Psychiatric: Normal judgment and insight. Alert and oriented x 3. Normal mood.   Labs on Admission: I have personally reviewed following labs and imaging studies  CBC: Recent Labs  Lab 01/02/20 1941  WBC 8.2  NEUTROABS 6.2  HGB 14.2  HCT 40.2  MCV 87.6  PLT 024   Basic Metabolic Panel: Recent Labs  Lab 01/02/20 1941  NA 132*  K 3.4*  CL 95*   CO2 25  GLUCOSE 260*  BUN 20  CREATININE 0.85  CALCIUM 8.8*   Liver Function Tests: Recent Labs  Lab 01/02/20 1941  AST 25  ALT 18  ALKPHOS 53  BILITOT 1.0  PROT 6.9  ALBUMIN 3.2*   Lipid Profile: Recent Labs    01/02/20 1942  TRIG 161*    Radiological Exams on Admission: DG Chest Portable 1 View  Result Date: 01/02/2020 CLINICAL DATA:  Dry cough. EXAM: PORTABLE CHEST 1 VIEW COMPARISON:  December 25, 2019 FINDINGS: Cardiomediastinal silhouette is normal. Mediastinal contours appear intact. There is no evidence of pleural effusion or pneumothorax. Patchy areas of airspace consolidation in bilateral lower lobes. Osseous structures are without acute abnormality. Soft tissues are grossly normal. IMPRESSION: Patchy areas of airspace consolidation  in bilateral lower lobes, which may represent atypical pneumonia. Electronically Signed   By: Fidela Salisbury M.D.   On: 01/02/2020 14:48    EKG: Independently reviewed.  Sinus rhythm rate 84, QTc 386.  No significant ST-T wave abnormalities compared to prior EKG from 2012.  Assessment/Plan Principal Problem:   Pneumonia due to COVID-19 virus Active Problems:   Acute respiratory failure with hypoxia (HCC)   Hypertension   Diabetes mellitus ([place])   DVT (deep venous thrombosis) (HCC)   Chronic pain syndrome  Pneumonia due to COVID-19 virus with acute hypoxic respiratory failure-O2 sats 79% on room air ambulation, currently on 2 L sats greater than 94%.  Chest x-ray findings consistent with atypical pneumonia.  Symptoms mostly dry cough without dyspnea.  CRP elevated at 26.5, D-dimer 1.54, ferritin 715. Vaccinated for Covid with 2 shots of [place], 1st dose in March. -Decadron 10 mg x 1 continue 6 mg daily -Remdesivir pharmacy to dose -Supplemental O2 -Mucolytic's, flutter valve, albuterol inhaler -COVID-19 admission protocol --Trend inflammatory markers -May need home O2.  Mild electrolyte abnormalities-hyponatremia 132,  hypokalemia  3.4.  Likely due to HCTZ. -Replete K  Diabetes mellitus-random glucose 260, - SSI- M -Resume home glipizide -Hold home Metformin - HgbA1c  Hypertension-stable. -Resume losartan HCTZ, metoprolol  Chronic pain -Resume home oxycodone 11/08/2023 as needed, gabapentin  DVT prophylaxis: Lovenox Code Status: Full code Family Communication: None at bedside Disposition Plan:   ~ 2 days, pending stable respiratory status.Consults called: None Admission status: Inpt, Tele. I certify that at the point of admission it is my clinical judgment that the patient will require inpatient hospital care spanning beyond 2 midnights from the point of admission due to high intensity of service, high risk for further deterioration and high frequency of surveillance required. The following factors support the patient status of inpatient: PNA and need for home o2.    Bethena Roys MD [facility]  01/02/2020, 9:38 PM

## 2020-01-02 NOTE — ED Notes (Signed)
Blood culture number one drawn via 21 gauge butterfly from L hand, blood culture number number two drawn during iv start in R ac.

## 2020-01-02 NOTE — ED Provider Notes (Signed)
Medical screening examination/treatment/procedure(s) were conducted as a shared visit with non-physician practitioner(s) and myself.  I personally evaluated the patient during the encounter.  Clinical Impression:   Final diagnoses:  None     Pt coughing X 2 weeks - finished abx but still coughing Feels well otherwise - lungs with infiltrates on xray - worse than a week ago - r/o covid -took prednisone, albuterol, Zithromax.  On exam patient has sats of 92 to 94%, diffuse wheezing around the bases but not in the upper lung fields, speaks in full sentences without distress or tachypnea, blood pressure normal, heart rate normal.  No edema no JVD.  Abdomen soft and nontender.  No gastrointestinal symptoms.  The patient does have rales, he has no leukocytosis, he has an x-ray concerning for bilateral patchy infiltrates consistent with a viral pneumonia and in fact is Covid positive.  On ambulation the patient dropped to 79% on room air, he does need oxygen, there is no way to get him oxygen from the emergency department, his acute hypoxic respiratory failure will need to be treated inpatient, discussed with hospitalist will admit.  This patient is critically ill  [name] was evaluated in Emergency Department on 01/02/2020 for the symptoms described in the history of present illness. He was evaluated in the context of the global COVID-19 pandemic, which necessitated consideration that the patient might be at risk for infection with the SARS-CoV-2 virus that causes COVID-19. Institutional protocols and algorithms that pertain to the evaluation of patients at risk for COVID-19 are in a state of rapid change based on information released by regulatory bodies including the CDC and federal and state organizations. These policies and algorithms were followed during the patient's care in the ED.   .Critical Care Performed by: Noemi Chapel, MD Authorized by: Noemi Chapel, MD   Critical care  provider statement:    Critical care time (minutes):  35   Critical care time was exclusive of:  Separately billable procedures and treating other patients and teaching time   Critical care was necessary to treat or prevent imminent or life-threatening deterioration of the following conditions:  Respiratory failure   Critical care was time spent personally by me on the following activities:  Blood draw for specimens, development of treatment plan with patient or surrogate, discussions with consultants, evaluation of patient's response to treatment, examination of patient, obtaining history from patient or surrogate, ordering and performing treatments and interventions, ordering and review of laboratory studies, ordering and review of radiographic studies, pulse oximetry, re-evaluation of patient's condition and review of old charts   Medical screening examination/treatment/procedure(s) were conducted as a shared visit with non-physician practitioner(s) and myself.  I personally evaluated the patient during the encounter.  Clinical Impression:   Final diagnoses:  Acute respiratory failure due to COVID-19 [facility])         Noemi Chapel, MD 01/02/20 2049

## 2020-01-02 NOTE — ED Provider Notes (Signed)
[facility] EMERGENCY DEPARTMENT Provider Note   CSN: [id number] Arrival date & time: 01/02/20  1346     History Chief Complaint  Patient presents with  . Cough    [name] is a 79 y.o. male.  HPI      [name] is a 79 y.o. male with past medical history of type 2 diabetes, hyperlipidemia and hypertension who presents to the Emergency Department complaining of persistent cough for 2 weeks  States his cough has been mostly nonproductive.  He states he was seen by his PCP 1 week ago for his cough and had a chest x-ray and was prescribed antibiotics and steroids and given albuterol nebulizer machine to use at home.  He has completed his antibiotics and steroids without improvement.  Patient is unsure of the name of the antibiotic he took.  He comes to the emergency department today for evaluation of his cough.  He denies chest pain, fever, chills, myalgias, shortness of breath and dyspnea on exertion.  No abdominal pain, diarrhea or vomiting.  Past Medical History:  Diagnosis Date  . Diabetes mellitus    A1c of 7 -12/2009  . DJD (degenerative joint disease)    left knee,cervical spine,lumbosacral spine  . DVT (deep venous thrombosis) ([place]) 12/2009   left lower extremity  following left TKA  . Hyperlipidemia   . Hypertension   . Obesity     Patient Active Problem List   Diagnosis Date Noted  . 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 Nocturia 03/06/2019  . DDD (degenerative disc disease), cervical 07/23/2017  . Lumbar radiculopathy 05/09/2017  . Chronic pain syndrome 04/03/2017  . Peripheral vascular disease ([place]) 06/12/2010  . Hypertension   . Diabetes mellitus   . DJD (degenerative joint disease)   . HYPERLIPIDEMIA 02/15/2010  . OBESITY 02/15/2010  . DVT (deep venous thrombosis) ([place]) 12/06/2009    Past Surgical History:  Procedure Laterality Date  . CERVICAL DISCECTOMY     and fusion  . COLONOSCOPY  08/15/2011   Procedure: COLONOSCOPY;  Surgeon:  Daneil Dolin, MD;  Location: [facility];  Service: Endoscopy;  Laterality: N/A;  11:15 AM  . COLONOSCOPY W/ POLYPECTOMY  2003  . TOTAL KNEE ARTHROPLASTY  2011  . TOTAL KNEE ARTHROPLASTY  03/2008  . TOTAL SHOULDER ARTHROPLASTY  05/2009   right        History reviewed. No pertinent family history.  Social History   Tobacco Use  . Smoking status: Never Smoker  . Smokeless tobacco: Never Used  Substance Use Topics  . Alcohol use: No  . Drug use: No    Home Medications Prior to Admission medications   Medication Sig Start Date End Date Taking? Authorizing Provider  Cholecalciferol (VITAMIN D3) 2000 UNITS TABS Take 1 tablet by mouth daily.      [provider]  gabapentin (NEURONTIN) 600 MG tablet Take 600 mg by mouth 3 (three) times daily.     [provider]  glipiZIDE (GLUCOTROL) 5 MG tablet Take 5 mg by mouth 2 (two) times daily before a meal. Pt takes one tablet in the AM     [provider]  losartan-hydrochlorothiazide (HYZAAR) 50-12.5 MG tablet Take 1 tablet by mouth daily.    [provider]  metFORMIN (GLUCOPHAGE) 500 MG tablet Take 500 mg by mouth 2 (two) times daily with a meal. Takes one tablet in the AM     [provider]  metoprolol tartrate (LOPRESSOR) 50 MG tablet  Take 50 mg by mouth daily.    [provider]  Multiple Vitamins-Minerals (MULTIVITAMIN WITH MINERALS) tablet Take 1 tablet by mouth daily.      [provider]  NON FORMULARY [facility]  Peripheral Neuropathy Cream- Bupivacaine 1%, Doxepin 3%, Gabapentin 6%, Pentoxifylline 3%, Topiramate 1% Apply 1-2 grams to affected area 3-4 times daily Qty. 120 gm 3 refills    [provider]  simvastatin (ZOCOR) 40 MG tablet Take 40 mg by mouth at bedtime.      [provider]    Allergies    Patient has no known allergies.  Review of Systems   Review of Systems  HENT: Negative for sore throat.   Respiratory: Positive  for cough. Negative for shortness of breath and wheezing.   Cardiovascular: Negative for chest pain and palpitations.  Gastrointestinal: Negative for abdominal pain, diarrhea, nausea and vomiting.  Genitourinary: Negative for dysuria and flank pain.  Musculoskeletal: Negative for arthralgias, back pain and myalgias.  Skin: Negative for rash.  Neurological: Negative for dizziness, weakness, numbness and headaches.  Hematological: Does not bruise/bleed easily.    Physical Exam Updated Vital Signs BP 116/67 (BP Location: Right Arm)   Pulse 90   Temp 97.8 F (36.6 C) (Oral)   Resp 20   Ht 5\' 11"  (1.803 m)   Wt 89.8 kg   SpO2 (!) 87%   BMI 27.62 kg/m   Physical Exam Vitals and nursing note reviewed.  Constitutional:      Appearance: Normal appearance. He is not ill-appearing.  HENT:     Mouth/Throat:     Mouth: Mucous membranes are moist.  Cardiovascular:     Rate and Rhythm: Normal rate and regular rhythm.     Pulses: Normal pulses.  Pulmonary:     Effort: Pulmonary effort is normal.     Comments: Course lungs sounds with scattered crackles at the bilateral bases Abdominal:     Palpations: Abdomen is soft.     Tenderness: There is no abdominal tenderness.  Musculoskeletal:        General: Normal range of motion.     Cervical back: Normal range of motion.     Right lower leg: No edema.     Left lower leg: No edema.  Skin:    General: Skin is warm.     Capillary Refill: Capillary refill takes less than 2 seconds.     Findings: No rash.  Neurological:     General: No focal deficit present.     Mental Status: He is alert.     Sensory: No sensory deficit.     Motor: No weakness.     ED Results / Procedures / Treatments   Labs (all labs ordered are listed, but only abnormal results are displayed) Labs Reviewed  RESP PANEL BY RT-PCR (FLU A&B, COVID) ARPGX2 - Abnormal; Notable for the following components:      Result Value   SARS Coronavirus 2 by RT PCR POSITIVE (*)     All other components within normal limits  CULTURE, BLOOD (ROUTINE X 2)  CULTURE, BLOOD (ROUTINE X 2)  LACTIC ACID, PLASMA  LACTIC ACID, PLASMA  CBC WITH DIFFERENTIAL/PLATELET  COMPREHENSIVE METABOLIC PANEL  D-DIMER, QUANTITATIVE (NOT AT [facility])  PROCALCITONIN  LACTATE DEHYDROGENASE  FERRITIN  TRIGLYCERIDES  FIBRINOGEN  C-REACTIVE PROTEIN    EKG None  Radiology DG Chest Portable 1 View  Result Date: 01/02/2020 CLINICAL DATA:  Dry cough. EXAM: PORTABLE CHEST 1 VIEW COMPARISON:  December 25, 2019 FINDINGS: Cardiomediastinal silhouette is normal. Mediastinal contours appear intact. There is no evidence of pleural effusion or pneumothorax. Patchy areas of airspace consolidation in bilateral lower lobes. Osseous structures are without acute abnormality. Soft tissues are grossly normal. IMPRESSION: Patchy areas of airspace consolidation in bilateral lower lobes, which may represent atypical pneumonia. Electronically Signed   By: Fidela Salisbury M.D.   On: 01/02/2020 14:48    Procedures Procedures (including critical care time)  Medications Ordered in ED Medications - No data to display  ED Course  I have reviewed the triage vital signs and the nursing notes.  Pertinent labs & imaging results that were available during my care of the patient were reviewed by me and considered in my medical decision making (see chart for details).    MDM Rules/Calculators/A&P                          Patient here for evaluation of cough x2 weeks.  Chest x-ray 6 days ago without evidence of infiltrates.  Has completed course of antibiotics (Azithromycin) and steroids. He has been fully vaccinated for COVID-19.  Triage vitals show O2 sat of 87% on room air, on cardiac monitoring in the exam room his oxygen is 93% on room air.  Patient is in no respiratory distress.  Repeat x-ray today shows patchy areas of consolidation in the bilateral lower lobes.  Clinically, I suspect Covid.  Pt appears  very comfortable, no respiratory distress.  Will obtain respiratory panel.    1900  covid test is positive.  Pt's O2 sat at 90-91% RA.  Anticipate d/c home if pt's not acutely hypoxic with ambulation.  Discussed with hospitalist the possibiltiy of arranging home oxygen from the ER, but informed that pt would require admission  Patient was ambulated in the room, O2 sat dropped to 79%, he was placed on 2 L oxygen by nasal cannula O2 sat currently at 97%.  He will need admission for his acute respiratory failure which is secondary to his Covid infection.  Discussed with Dr. Sabra Heck who agrees to arrange admission once labs are completed.   Final Clinical Impression(s) / ED Diagnoses Final diagnoses:  Acute respiratory failure due to COVID-19 [facility])    Rx / DC Orders ED Discharge Orders    None       Kem Parkinson, PA-C 01/02/20 1945    Noemi Chapel, MD 01/02/20 2050

## 2020-01-02 NOTE — ED Triage Notes (Signed)
Pt with cough for a week and was seen by Dr. Josephine Cables office and per pt a Covid test was not done.  Denies fever or N/V/D.  Cough dry per pt.

## 2020-01-03 ENCOUNTER — Other Ambulatory Visit: Payer: Self-pay

## 2020-01-03 ENCOUNTER — Encounter (HOSPITAL_COMMUNITY): Payer: Self-pay | Admitting: Internal Medicine

## 2020-01-03 DIAGNOSIS — J9600 Acute respiratory failure, unspecified whether with hypoxia or hypercapnia: Secondary | ICD-10-CM

## 2020-01-03 DIAGNOSIS — G894 Chronic pain syndrome: Secondary | ICD-10-CM | POA: Diagnosis not present

## 2020-01-03 DIAGNOSIS — U071 COVID-19: Secondary | ICD-10-CM | POA: Diagnosis not present

## 2020-01-03 DIAGNOSIS — I10 Essential (primary) hypertension: Secondary | ICD-10-CM

## 2020-01-03 DIAGNOSIS — J9601 Acute respiratory failure with hypoxia: Secondary | ICD-10-CM | POA: Diagnosis not present

## 2020-01-03 LAB — COMPREHENSIVE METABOLIC PANEL
ALT: 18 U/L (ref 0–44)
AST: 27 U/L (ref 15–41)
Albumin: 2.9 g/dL — ABNORMAL LOW (ref 3.5–5.0)
Alkaline Phosphatase: 53 U/L (ref 38–126)
Anion gap: 10 (ref 5–15)
BUN: 20 mg/dL (ref 8–23)
CO2: 26 mmol/L (ref 22–32)
Calcium: 9 mg/dL (ref 8.9–10.3)
Chloride: 97 mmol/L — ABNORMAL LOW (ref 98–111)
Creatinine, Ser: 0.82 mg/dL (ref 0.61–1.24)
GFR, Estimated: >60 mL/min (ref >60)
Glucose, Bld: 324 mg/dL — ABNORMAL HIGH (ref 70–99)
Potassium: 4.7 mmol/L (ref 3.5–5.1)
Sodium: 133 mmol/L — ABNORMAL LOW (ref 135–145)
Total Bilirubin: 0.7 mg/dL (ref 0.3–1.2)
Total Protein: 6.9 g/dL (ref 6.5–8.1)

## 2020-01-03 LAB — CBC WITH DIFFERENTIAL/PLATELET
Abs Immature Granulocytes: 0.05 10*3/uL (ref 0.00–0.07)
Basophils Absolute: 0 10*3/uL (ref 0.0–0.1)
Basophils Relative: 0 %
Eosinophils Absolute: 0 10*3/uL (ref 0.0–0.5)
Eosinophils Relative: 0 %
HCT: 42.1 % (ref 39.0–52.0)
Hemoglobin: 14.5 g/dL (ref 13.0–17.0)
Immature Granulocytes: 1 %
Lymphocytes Relative: 11 %
Lymphs Abs: 0.6 10*3/uL — ABNORMAL LOW (ref 0.7–4.0)
MCH: 30.3 pg (ref 26.0–34.0)
MCHC: 34.4 g/dL (ref 30.0–36.0)
MCV: 88.1 fL (ref 80.0–100.0)
Monocytes Absolute: 0.2 10*3/uL (ref 0.1–1.0)
Monocytes Relative: 3 %
Neutro Abs: 4.5 10*3/uL (ref 1.7–7.7)
Neutrophils Relative %: 85 %
Platelets: 215 10*3/uL (ref 150–400)
RBC: 4.78 MIL/uL (ref 4.22–5.81)
RDW: 12.5 % (ref 11.5–15.5)
WBC: 5.3 10*3/uL (ref 4.0–10.5)
nRBC: 0 % (ref 0.0–0.2)

## 2020-01-03 LAB — C-REACTIVE PROTEIN: CRP: 26.1 mg/dL — ABNORMAL HIGH (ref <1.0)

## 2020-01-03 LAB — CBG MONITORING, ED
Glucose-Capillary: 309 mg/dL — ABNORMAL HIGH (ref 70–99)
Glucose-Capillary: 315 mg/dL — ABNORMAL HIGH (ref 70–99)

## 2020-01-03 LAB — FERRITIN: Ferritin: 693 ng/mL — ABNORMAL HIGH (ref 24–336)

## 2020-01-03 LAB — MAGNESIUM: Magnesium: 1.7 mg/dL (ref 1.7–2.4)

## 2020-01-03 LAB — PROCALCITONIN: Procalcitonin: <0.1 ng/mL

## 2020-01-03 LAB — D-DIMER, QUANTITATIVE: D-Dimer, Quant: 1.17 ug/mL-FEU — ABNORMAL HIGH (ref 0.00–0.50)

## 2020-01-03 MED ORDER — INSULIN ASPART 100 UNIT/ML ~~LOC~~ SOLN
4.0000 [IU] | Freq: Three times a day (TID) | SUBCUTANEOUS | Status: DC
  Administered 2020-01-03 – 2020-01-04 (×3): 4 [IU] via SUBCUTANEOUS

## 2020-01-03 MED ORDER — MELATONIN 3 MG PO TABS: 6.0000 mg | ORAL_TABLET | Freq: Every evening | ORAL | Status: DC | PRN

## 2020-01-03 MED ORDER — MAGNESIUM SULFATE 2 GM/50ML IV SOLN
2.0000 g | Freq: Once | INTRAVENOUS | Status: AC
  Administered 2020-01-03: 2 g via INTRAVENOUS
  Filled 2020-01-03: qty 50

## 2020-01-03 MED ORDER — MELATONIN 3 MG PO TABS
6.0000 mg | ORAL_TABLET | Freq: Every day | ORAL | Status: DC
  Administered 2020-01-03 (×2): 6 mg via ORAL
  Filled 2020-01-03 (×2): qty 2

## 2020-01-03 MED ORDER — METHYLPREDNISOLONE SODIUM SUCC 125 MG IJ SOLR
50.0000 mg | Freq: Two times a day (BID) | INTRAMUSCULAR | Status: DC
  Administered 2020-01-03 – 2020-01-04 (×3): 50 mg via INTRAVENOUS
  Filled 2020-01-03 (×3): qty 2

## 2020-01-03 MED ORDER — MELATONIN 5 MG PO TABS
5.0000 mg | ORAL_TABLET | Freq: Every day | ORAL | Status: DC
  Filled 2020-01-03 (×2): qty 1

## 2020-01-03 MED ORDER — INSULIN DETEMIR 100 UNIT/ML ~~LOC~~ SOLN
10.0000 [IU] | Freq: Two times a day (BID) | SUBCUTANEOUS | Status: DC
  Administered 2020-01-03 – 2020-01-04 (×2): 10 [IU] via SUBCUTANEOUS
  Filled 2020-01-03 (×4): qty 0.1

## 2020-01-03 MED ORDER — METOPROLOL SUCCINATE ER 25 MG PO TB24
25.0000 mg | ORAL_TABLET | Freq: Every day | ORAL | Status: DC
  Administered 2020-01-03 – 2020-01-04 (×2): 25 mg via ORAL
  Filled 2020-01-03 (×2): qty 1

## 2020-01-03 MED ORDER — SODIUM CHLORIDE 0.9 % IV SOLN: INTRAVENOUS | Status: DC

## 2020-01-03 NOTE — Discharge Summary (Addendum)
Physician Discharge Summary  [name] KTG:[id number] DOB: [birth year] DOA: 01/02/2020  PCP: Rosita Fire, MD  Admit date: 01/02/2020 Discharge date: 01/04/2020  Admitted From: Home Disposition:  Home   Recommendations for Outpatient Follow-up:  1. Follow up with PCP in 1-2 weeks 2. Please obtain BMP/CBC in one week     Discharge Condition: Stable CODE STATUS: FULL Diet recommendation: Heart Healthy / Carb Modified   Brief/Interim Summary: 79 year old male with a history of diabetes mellitus type 2, hypertension, hyperlipidemia, left lower extremity DVT 2011 presenting with 2-week history of nonproductive cough.  Apparently, the patient had gone to his primary care provider about a week prior to this admission and was given a prescription for prednisone, azithromycin, and a nebulizer machine.  He stated that his cough did not significantly improve.  Fortunately, he denied any fevers, chills, chest pain, shortness breath, nausea, vomiting, diarrhea, abdominal pain, dysuria, hematuria, headache, sore throat. Because of continued and persistent cough, he presented for further evaluation.  In the emergency department, the patient was noted to have WBC 8.2, hemoglobin 14.2, platelets 224,000.  Sodium 132, potassium 3.4, chloride 85, CO2 25, BUN 20, creatinine 0.5.  LFTs were unremarkable.  Lactic acid was 1.7.  COVID-19 RT-PCR was positive.  Chest x-ray showed patchy bibasilar opacities.  The patient had low-grade temperature 99.6 F.  He was hemodynamically stable with oxygen saturation 87% on room air.  With ambulation, his oxygen saturation dropped to 79%.  Patient was started on remdesivir and steroids.  Discharge Diagnoses:  Acute respiratory failure with hypoxia secondary to COVID-19 -Presently stable on 2 L nasal cannula -Wean oxygen as tolerated -Personally reviewed chest x-ray--bibasilar opacities -Ferritin 715>>693 -D-dimer 1.54>>1.17 -CRP 26.5>>26.1 -Continue  remdesivir and IV steroids -PCT 0.11>>>less than <0.10 -Repeat PCT today -Personally reviewed EKG--sinus rhythm, no ST-T wave change -continue vitamin C/zinc -prone position as much as pt can tolerate -IS -ambulatory pulse ox did not show desaturation <88% on RA -outpatient remdesivir infusion set up for 11/30 and 12/1 -d/c home with prednisone 50 mg daily x 8 days  Essential hypertension -Holding losartan, HCTZ secondary to soft blood pressure--restart after dc -Decreased dose of metoprolol succinate>>d/c home on home dose  Diabetes mellitus type 2 with neuropathy -NovoLog sliding scale -add levemir 10 units bid during hospitalization -novolog 4 units with meals during hospitalization -Check hemoglobin A1c--8.0 -Anticipate elevated CBGs secondary to steroids -Discontinue glipizide for now--restart after d/c -Holding Metformin--restart after d/c -Continue gabapentin  Hyponatremia -Secondary to elevated CBG and volume depletion -Judicious IV fluids  Hypomagnesium -repleted  Hyperlipidemia -Continue statin   Discharge Instructions   Allergies as of 01/04/2020   No Known Allergies     Medication List    STOP taking these medications   azithromycin 250 MG tablet Commonly known as: ZITHROMAX     TAKE these medications   baclofen 10 MG tablet Commonly known as: LIORESAL Take 1 tablet by mouth daily.   gabapentin 800 MG tablet Commonly known as: NEURONTIN Take 800 mg by mouth 3 (three) times daily.   glipiZIDE 5 MG tablet Commonly known as: GLUCOTROL Take 5 mg by mouth 2 (two) times daily before a meal. Pt takes one tablet in the AM   HYDROcodone-acetaminophen 10-325 MG tablet Commonly known as: NORCO Take 1 tablet by mouth 4 (four) times daily as needed.   ipratropium-albuterol 0.5-2.5 (3) MG/3ML Soln Commonly known as: DUONEB Take 0.5 mLs by nebulization 4 (four) times daily as needed.   losartan-hydrochlorothiazide 50-12.5 MG tablet Commonly  known as: HYZAAR Take 1 tablet by mouth daily.   meclizine 25 MG tablet Commonly known as: ANTIVERT Take 25 mg by mouth 2 (two) times daily as needed for dizziness.   metFORMIN 500 MG tablet Commonly known as: GLUCOPHAGE Take 1 tablet (500 mg total) by mouth 2 (two) times daily with a meal. Takes one tablet in the AM   metoprolol succinate 50 MG 24 hr tablet Commonly known as: TOPROL-XL Take 50 mg by mouth daily.   multivitamin with minerals tablet Take 1 tablet by mouth daily.   NON FORMULARY [facility]  Peripheral Neuropathy Cream- Bupivacaine 1%, Doxepin 3%, Gabapentin 6%, Pentoxifylline 3%, Topiramate 1% Apply 1-2 grams to affected area 3-4 times daily Qty. 120 gm 3 refills   predniSONE 50 MG tablet Commonly known as: DELTASONE Take 1 tablet (50 mg total) by mouth daily with breakfast.   simvastatin 40 MG tablet Commonly known as: ZOCOR Take 40 mg by mouth at bedtime.   Vitamin D3 50 MCG (2000 UT) Tabs Take 1 tablet by mouth daily.       No Known Allergies  Consultations:  none   Procedures/Studies: DG Chest 2 View  Result Date: 12/27/2019 CLINICAL DATA:  Cough for 1 week. EXAM: CHEST - 2 VIEW COMPARISON:  12/07/2011 FINDINGS: The heart is normal in size. Mild tortuosity of the thoracic aorta. The lungs are clear. No pleural effusions. No pulmonary lesions. The bony thorax is intact. A right shoulder arthroplasty is noted. IMPRESSION: No acute cardiopulmonary findings. Electronically Signed   By: Marijo Sanes M.D.   On: 12/27/2019 16:29   DG Chest Portable 1 View  Result Date: 01/02/2020 CLINICAL DATA:  Dry cough. EXAM: PORTABLE CHEST 1 VIEW COMPARISON:  December 25, 2019 FINDINGS: Cardiomediastinal silhouette is normal. Mediastinal contours appear intact. There is no evidence of pleural effusion or pneumothorax. Patchy areas of airspace consolidation in bilateral lower lobes. Osseous structures are without acute abnormality. Soft tissues are  grossly normal. IMPRESSION: Patchy areas of airspace consolidation in bilateral lower lobes, which may represent atypical pneumonia. Electronically Signed   By: Fidela Salisbury M.D.   On: 01/02/2020 14:48        Discharge Exam: Vitals:   01/04/20 0641 01/04/20 0746  BP: 134/83   Pulse: 62   Resp: 20   Temp: 97.9 F (36.6 C)   SpO2: 97% 97%   Vitals:   01/04/20 0134 01/04/20 0433 01/04/20 0641 01/04/20 0746  BP: 130/77 (!) 145/80 134/83   Pulse: 70 71 62   Resp: 20 20 20    Temp: 97.7 F (36.5 C) 98.2 F (36.8 C) 97.9 F (36.6 C)   TempSrc: Oral     SpO2: 96% 97% 97% 97%  Weight:      Height:        General: Pt is alert, awake, not in acute distress Cardiovascular: RRR, S1/S2 +, no rubs, no gallops Respiratory: bibasilar rales. No wheeze Abdominal: Soft, NT, ND, bowel sounds + Extremities: no edema, no cyanosis   The results of significant diagnostics from this hospitalization (including imaging, microbiology, ancillary and laboratory) are listed below for reference.    Significant Diagnostic Studies: DG Chest 2 View  Result Date: 12/27/2019 CLINICAL DATA:  Cough for 1 week. EXAM: CHEST - 2 VIEW COMPARISON:  12/07/2011 FINDINGS: The heart is normal in size. Mild tortuosity of the thoracic aorta. The lungs are clear. No pleural effusions. No pulmonary lesions. The bony thorax is intact. A right shoulder arthroplasty is noted. IMPRESSION: No  acute cardiopulmonary findings. Electronically Signed   By: Marijo Sanes M.D.   On: 12/27/2019 16:29   DG Chest Portable 1 View  Result Date: 01/02/2020 CLINICAL DATA:  Dry cough. EXAM: PORTABLE CHEST 1 VIEW COMPARISON:  December 25, 2019 FINDINGS: Cardiomediastinal silhouette is normal. Mediastinal contours appear intact. There is no evidence of pleural effusion or pneumothorax. Patchy areas of airspace consolidation in bilateral lower lobes. Osseous structures are without acute abnormality. Soft tissues are grossly normal.  IMPRESSION: Patchy areas of airspace consolidation in bilateral lower lobes, which may represent atypical pneumonia. Electronically Signed   By: Fidela Salisbury M.D.   On: 01/02/2020 14:48     Microbiology: Recent Results (from the past 240 hour(s))  Resp Panel by RT-PCR (Flu A&B, Covid) Nasopharyngeal Swab     Status: Abnormal   Collection Time: 01/02/20  4:03 PM   Specimen: Nasopharyngeal Swab; Nasopharyngeal(NP) swabs in vial transport medium  Result Value Ref Range Status   SARS Coronavirus 2 by RT PCR POSITIVE (A) NEGATIVE Final    Comment: RESULT CALLED TO, READ BACK BY AND VERIFIED WITH: DEVOLT,T @ 1851 ON 01/02/20 BY JUW (NOTE) SARS-CoV-2 target nucleic acids are DETECTED.  The SARS-CoV-2 RNA is generally detectable in upper respiratory specimens during the acute phase of infection. Positive results are indicative of the presence of the identified virus, but do not rule out bacterial infection or co-infection with other pathogens not detected by the test. Clinical correlation with patient history and other diagnostic information is necessary to determine patient infection status. The expected result is Negative.  Fact Sheet for Patients: EntrepreneurPulse.com.au  Fact Sheet for Healthcare Providers: IncredibleEmployment.be  This test is not yet approved or cleared by the [place] FDA and  has been authorized for detection and/or diagnosis of SARS-CoV-2 by FDA under an Emergency Use Authorization (EUA).  This EUA will remain in effect (meaning this test can b e used) for the duration of  the COVID-19 declaration under Section 564(b)(1) of the Act, 21 U.S.C. section 360bbb-3(b)(1), unless the authorization is terminated or revoked sooner.     Influenza A by PCR NEGATIVE NEGATIVE Final   Influenza B by PCR NEGATIVE NEGATIVE Final    Comment: (NOTE) The Xpert Xpress SARS-CoV-2/FLU/RSV plus assay is intended as an aid in the  diagnosis of influenza from Nasopharyngeal swab specimens and should not be used as a sole basis for treatment. Nasal washings and aspirates are unacceptable for Xpert Xpress SARS-CoV-2/FLU/RSV testing.  Fact Sheet for Patients: EntrepreneurPulse.com.au  Fact Sheet for Healthcare Providers: IncredibleEmployment.be  This test is not yet approved or cleared by the [place] FDA and has been authorized for detection and/or diagnosis of SARS-CoV-2 by FDA under an Emergency Use Authorization (EUA). This EUA will remain in effect (meaning this test can be used) for the duration of the COVID-19 declaration under Section 564(b)(1) of the Act, 21 U.S.C. section 360bbb-3(b)(1), unless the authorization is terminated or revoked.  Performed at [facility], [address], [place], [place] [address]   Blood Culture (routine x 2)     Status: None (Preliminary result)   Collection Time: 01/02/20  7:41 PM   Specimen: BLOOD LEFT HAND  Result Value Ref Range Status   Specimen Description BLOOD LEFT HAND  Final   Special Requests   Final    BOTTLES DRAWN AEROBIC AND ANAEROBIC Blood Culture adequate volume   Culture   Final    NO GROWTH 2 DAYS Performed at [facility], [address],  [place], [place] [address]    Report Status PENDING  Incomplete  Blood Culture (routine x 2)     Status: None (Preliminary result)   Collection Time: 01/02/20  7:55 PM   Specimen: Right Antecubital; Blood  Result Value Ref Range Status   Specimen Description RIGHT ANTECUBITAL  Final   Special Requests   Final    BOTTLES DRAWN AEROBIC AND ANAEROBIC Blood Culture adequate volume   Culture   Final    NO GROWTH 2 DAYS Performed at [facility], [address], [place], [place] [address]    Report Status PENDING  Incomplete     Labs: Basic Metabolic Panel: Recent Labs  Lab 01/02/20 1941 01/03/20 0605  NA 132* 133*  K 3.4* 4.7  CL 95* 97*  CO2 25 26  GLUCOSE 260* 324*   BUN 20 20  CREATININE 0.85 0.82  CALCIUM 8.8* 9.0  MG 1.6* 1.7   Liver Function Tests: Recent Labs  Lab 01/02/20 1941 01/03/20 0605  AST 25 27  ALT 18 18  ALKPHOS 53 53  BILITOT 1.0 0.7  PROT 6.9 6.9  ALBUMIN 3.2* 2.9*   No results for input(s): LIPASE, AMYLASE in the last 168 hours. No results for input(s): AMMONIA in the last 168 hours. CBC: Recent Labs  Lab 01/02/20 1941 01/03/20 0605  WBC 8.2 5.3  NEUTROABS 6.2 4.5  HGB 14.2 14.5  HCT 40.2 42.1  MCV 87.6 88.1  PLT 224 215   Cardiac Enzymes: No results for input(s): CKTOTAL, CKMB, CKMBINDEX, TROPONINI in the last 168 hours. BNP: Invalid input(s): POCBNP CBG: Recent Labs  Lab 01/03/20 1148 01/03/20 1644 01/03/20 2114 01/04/20 0750 01/04/20 1134  GLUCAP 315* 349* 360* 311* 397*    Time coordinating discharge:  36 minutes  Signed:  Orson Eva, DO [facility] Pager: [phone] 01/04/2020, 12:37 PM

## 2020-01-03 NOTE — Progress Notes (Signed)
PROGRESS NOTE  [name] ALP:[id number] DOB: 1940-03-06 DOA: 01/02/2020 PCP: Rosita Fire, MD  Brief History:  79 year old male with a history of diabetes mellitus type 2, hypertension, hyperlipidemia, left lower extremity DVT 2011 presenting with 2-week history of nonproductive cough.  Apparently, the patient had gone to his primary care provider about a week prior to this admission and was given a prescription for prednisone, azithromycin, and a nebulizer machine.  He stated that his cough did not significantly improve.  Fortunately, he denied any fevers, chills, chest pain, shortness breath, nausea, vomiting, diarrhea, abdominal pain, dysuria, hematuria, headache, sore throat. Because of continued and persistent cough, he presented for further evaluation.  In the emergency department, the patient was noted to have WBC 8.2, hemoglobin 14.2, platelets 224,000.  Sodium 132, potassium 3.4, chloride 85, CO2 25, BUN 20, creatinine 0.5.  LFTs were unremarkable.  Lactic acid was 1.7.  COVID-19 RT-PCR was positive.  Chest x-ray showed patchy bibasilar opacities.  The patient had low-grade temperature 99.6 F.  He was hemodynamically stable with oxygen saturation 87% on room air.  With ambulation, his oxygen saturation dropped to 79%.  Patient was started on remdesivir and steroids.  Assessment/Plan: Acute respiratory failure with hypoxia secondary to COVID-19 -Presently stable on 2 L nasal cannula -Wean oxygen as tolerated -Personally reviewed chest x-ray--bibasilar opacities -Ferritin 715 -D-dimer 1.54 -CRP 26.5 -Continue remdesivir and IV steroids -PCT 0.11 -Repeat PCT today -Personally reviewed EKG--sinus rhythm, no ST-T wave change -continue vitamin C/zinc -prone position as much as pt can tolerate -IS  Essential hypertension -Holding losartan, HCTZ secondary to soft blood pressure -Decreased dose of metoprolol succinate  Diabetes mellitus type 2 with  neuropathy -NovoLog sliding scale -Check hemoglobin A1c -Anticipate elevated CBGs secondary to steroids -Discontinue glipizide for now -Holding Metformin -Continue gabapentin  Hyponatremia -Secondary to elevated CBG and volume depletion -Judicious IV fluids  Hyperlipidemia -Continue statin      Status is: Inpatient  Remains inpatient appropriate because:IV treatments appropriate due to intensity of illness or inability to take PO   Dispo: The patient is from: Home              Anticipated d/c is to: Home              Anticipated d/c date is: 2 days              Patient currently is not medically stable to d/c.        Family Communication:  no Family at bedside  Consultants:  none  Code Status:  FULL   DVT Prophylaxis:  [place] Lovenox   Procedures: As Listed in Progress Note Above  Antibiotics: None       Subjective: Patient feels that his cough is a little bit better.  He denies any fevers, chills, headache, chest pain, shortness breath, nausea, vomiting, diarrhea, abdominal pain, dysuria, hematuria.  Objective: Vitals:   01/03/20 0600 01/03/20 0630 01/03/20 0700 01/03/20 0800  BP: 105/62 105/62 96/60 126/62  Pulse: 70 70 70 70  Resp: 16   16  Temp:      TempSrc:      SpO2: 96% 94% 94% 96%  Weight:      Height:        Intake/Output Summary (Last 24 hours) at 01/03/2020 0828 Last data filed at 01/03/2020 0022 Gross per 24 hour  Intake 200 ml  Output --  Net 200 ml   Weight change:  Exam:   General:  Pt is alert, follows commands appropriately, not in acute distress  HEENT: No icterus, No thrush, No neck mass, [place]/AT  Cardiovascular: RRR, S1/S2, no rubs, no gallops  Respiratory: Bibasilar rales.  No wheezing.  Good air movement  Abdomen: Soft/+BS, non tender, non distended, no guarding  Extremities: No edema, No lymphangitis, No petechiae, No rashes, no synovitis   Data Reviewed: I have personally reviewed following labs and  imaging studies Basic Metabolic Panel: Recent Labs  Lab 01/02/20 1941 01/03/20 0605  NA 132* 133*  K 3.4* 4.7  CL 95* 97*  CO2 25 26  GLUCOSE 260* 324*  BUN 20 20  CREATININE 0.85 0.82  CALCIUM 8.8* 9.0  MG 1.6*  --    Liver Function Tests: Recent Labs  Lab 01/02/20 1941 01/03/20 0605  AST 25 27  ALT 18 18  ALKPHOS 53 53  BILITOT 1.0 0.7  PROT 6.9 6.9  ALBUMIN 3.2* 2.9*   No results for input(s): LIPASE, AMYLASE in the last 168 hours. No results for input(s): AMMONIA in the last 168 hours. Coagulation Profile: No results for input(s): INR, PROTIME in the last 168 hours. CBC: Recent Labs  Lab 01/02/20 1941 01/03/20 0605  WBC 8.2 5.3  NEUTROABS 6.2 4.5  HGB 14.2 14.5  HCT 40.2 42.1  MCV 87.6 88.1  PLT 224 215   Cardiac Enzymes: No results for input(s): CKTOTAL, CKMB, CKMBINDEX, TROPONINI in the last 168 hours. BNP: Invalid input(s): POCBNP CBG: Recent Labs  Lab 01/02/20 2313  GLUCAP 256*   HbA1C: No results for input(s): HGBA1C in the last 72 hours. Urine analysis:    Component Value Date/Time   COLORURINE YELLOW 12/23/2009 0151   APPEARANCEUR CLEAR 12/23/2009 0151   LABSPEC 1.020 12/23/2009 0151   PHURINE 7.5 12/23/2009 0151   GLUCOSEU 100 (A) 12/23/2009 0151   HGBUR NEGATIVE 12/23/2009 0151   BILIRUBINUR NEGATIVE 12/23/2009 0151   KETONESUR 40 (A) 12/23/2009 0151   PROTEINUR NEGATIVE 12/23/2009 0151   UROBILINOGEN 1.0 12/23/2009 0151   NITRITE NEGATIVE 12/23/2009 0151   LEUKOCYTESUR  12/23/2009 0151    NEGATIVE MICROSCOPIC NOT DONE ON URINES WITH NEGATIVE PROTEIN, BLOOD, LEUKOCYTES, NITRITE, OR GLUCOSE <1000 mg/dL.   Sepsis Labs: @LABRCNTIP (procalcitonin:4,lacticidven:4) ) Recent Results (from the past 240 hour(s))  Resp Panel by RT-PCR (Flu A&B, Covid) Nasopharyngeal Swab     Status: Abnormal   Collection Time: 01/02/20  4:03 PM   Specimen: Nasopharyngeal Swab; Nasopharyngeal(NP) swabs in vial transport medium  Result Value Ref Range  Status   SARS Coronavirus 2 by RT PCR POSITIVE (A) NEGATIVE Final    Comment: RESULT CALLED TO, READ BACK BY AND VERIFIED WITH: DEVOLT,T @ 1851 ON 01/02/20 BY JUW (NOTE) SARS-CoV-2 target nucleic acids are DETECTED.  The SARS-CoV-2 RNA is generally detectable in upper respiratory specimens during the acute phase of infection. Positive results are indicative of the presence of the identified virus, but do not rule out bacterial infection or co-infection with other pathogens not detected by the test. Clinical correlation with patient history and other diagnostic information is necessary to determine patient infection status. The expected result is Negative.  Fact Sheet for Patients: EntrepreneurPulse.com.au  Fact Sheet for Healthcare Providers: IncredibleEmployment.be  This test is not yet approved or cleared by the [place] FDA and  has been authorized for detection and/or diagnosis of SARS-CoV-2 by FDA under an Emergency Use Authorization (EUA).  This EUA will remain in effect (meaning this test can b e used) for  the duration of  the COVID-19 declaration under Section 564(b)(1) of the Act, 21 U.S.C. section 360bbb-3(b)(1), unless the authorization is terminated or revoked sooner.     Influenza A by PCR NEGATIVE NEGATIVE Final   Influenza B by PCR NEGATIVE NEGATIVE Final    Comment: (NOTE) The Xpert Xpress SARS-CoV-2/FLU/RSV plus assay is intended as an aid in the diagnosis of influenza from Nasopharyngeal swab specimens and should not be used as a sole basis for treatment. Nasal washings and aspirates are unacceptable for Xpert Xpress SARS-CoV-2/FLU/RSV testing.  Fact Sheet for Patients: EntrepreneurPulse.com.au  Fact Sheet for Healthcare Providers: IncredibleEmployment.be  This test is not yet approved or cleared by the [place] FDA and has been authorized for detection and/or diagnosis of  SARS-CoV-2 by FDA under an Emergency Use Authorization (EUA). This EUA will remain in effect (meaning this test can be used) for the duration of the COVID-19 declaration under Section 564(b)(1) of the Act, 21 U.S.C. section 360bbb-3(b)(1), unless the authorization is terminated or revoked.  Performed at [facility], [address] [place], [place] [address]   Blood Culture (routine x 2)     Status: None (Preliminary result)   Collection Time: 01/02/20  7:41 PM   Specimen: BLOOD LEFT HAND  Result Value Ref Range Status   Specimen Description BLOOD LEFT HAND  Final   Special Requests   Final    BOTTLES DRAWN AEROBIC AND ANAEROBIC Blood Culture adequate volume   Culture   Final    NO GROWTH < 12 HOURS Performed at [facility], [address], [place], [place] [address]    Report Status PENDING  Incomplete  Blood Culture (routine x 2)     Status: None (Preliminary result)   Collection Time: 01/02/20  7:55 PM   Specimen: Right Antecubital; Blood  Result Value Ref Range Status   Specimen Description RIGHT ANTECUBITAL  Final   Special Requests   Final    BOTTLES DRAWN AEROBIC AND ANAEROBIC Blood Culture adequate volume   Culture   Final    NO GROWTH < 12 HOURS Performed at [facility], [address], [place], [place] [address]    Report Status PENDING  Incomplete     Scheduled Meds: . albuterol  2 puff Inhalation Q6H  . vitamin C  500 mg Oral Daily  . dexamethasone (DECADRON) injection  6 mg Intravenous Q24H  . enoxaparin (LOVENOX) injection  40 mg Subcutaneous Q24H  . gabapentin  800 mg Oral TID  . insulin aspart  0-15 Units Subcutaneous TID WC  . insulin aspart  0-5 Units Subcutaneous QHS  . melatonin  6 mg Oral QHS  . metoprolol succinate  25 mg Oral Daily  . simvastatin  40 mg Oral QHS  . zinc sulfate  220 mg Oral Daily   Continuous Infusions: . remdesivir 100 mg in NS 100 mL      Procedures/Studies: DG Chest 2 View  Result Date: 12/27/2019 CLINICAL DATA:  Cough  for 1 week. EXAM: CHEST - 2 VIEW COMPARISON:  12/07/2011 FINDINGS: The heart is normal in size. Mild tortuosity of the thoracic aorta. The lungs are clear. No pleural effusions. No pulmonary lesions. The bony thorax is intact. A right shoulder arthroplasty is noted. IMPRESSION: No acute cardiopulmonary findings. Electronically Signed   By: Marijo Sanes M.D.   On: 12/27/2019 16:29   DG Chest Portable 1 View  Result Date: 01/02/2020 CLINICAL DATA:  Dry cough. EXAM: PORTABLE CHEST 1 VIEW COMPARISON:  December 25, 2019  FINDINGS: Cardiomediastinal silhouette is normal. Mediastinal contours appear intact. There is no evidence of pleural effusion or pneumothorax. Patchy areas of airspace consolidation in bilateral lower lobes. Osseous structures are without acute abnormality. Soft tissues are grossly normal. IMPRESSION: Patchy areas of airspace consolidation in bilateral lower lobes, which may represent atypical pneumonia. Electronically Signed   By: Fidela Salisbury M.D.   On: 01/02/2020 14:48    Orson Eva, DO  [facility]  If 7PM-7AM, please contact night-coverage [email or web address] Password [email or web address] 01/03/2020, 8:28 AM   LOS: 1 day

## 2020-01-04 DIAGNOSIS — G894 Chronic pain syndrome: Secondary | ICD-10-CM | POA: Diagnosis not present

## 2020-01-04 DIAGNOSIS — J1282 Pneumonia due to coronavirus disease 2019: Secondary | ICD-10-CM | POA: Diagnosis not present

## 2020-01-04 DIAGNOSIS — U071 COVID-19: Secondary | ICD-10-CM | POA: Diagnosis not present

## 2020-01-04 DIAGNOSIS — J9600 Acute respiratory failure, unspecified whether with hypoxia or hypercapnia: Secondary | ICD-10-CM | POA: Diagnosis not present

## 2020-01-04 LAB — CBC WITH DIFFERENTIAL/PLATELET
Abs Immature Granulocytes: 0.16 10*3/uL — ABNORMAL HIGH (ref 0.00–0.07)
Basophils Absolute: 0 10*3/uL (ref 0.0–0.1)
Basophils Relative: 0 %
Eosinophils Absolute: 0 10*3/uL (ref 0.0–0.5)
Eosinophils Relative: 0 %
HCT: 42.2 % (ref 39.0–52.0)
Hemoglobin: 14.6 g/dL (ref 13.0–17.0)
Immature Granulocytes: 1 %
Lymphocytes Relative: 5 %
Lymphs Abs: 0.7 10*3/uL (ref 0.7–4.0)
MCH: 30.4 pg (ref 26.0–34.0)
MCHC: 34.6 g/dL (ref 30.0–36.0)
MCV: 87.9 fL (ref 80.0–100.0)
Monocytes Absolute: 0.6 10*3/uL (ref 0.1–1.0)
Monocytes Relative: 4 %
Neutro Abs: 12.3 10*3/uL — ABNORMAL HIGH (ref 1.7–7.7)
Neutrophils Relative %: 90 %
Platelets: 307 10*3/uL (ref 150–400)
RBC: 4.8 MIL/uL (ref 4.22–5.81)
RDW: 12.7 % (ref 11.5–15.5)
WBC: 13.7 10*3/uL — ABNORMAL HIGH (ref 4.0–10.5)
nRBC: 0 % (ref 0.0–0.2)

## 2020-01-04 LAB — COMPREHENSIVE METABOLIC PANEL
ALT: 22 U/L (ref 0–44)
AST: 29 U/L (ref 15–41)
Albumin: 3.3 g/dL — ABNORMAL LOW (ref 3.5–5.0)
Alkaline Phosphatase: 57 U/L (ref 38–126)
Anion gap: 13 (ref 5–15)
BUN: 25 mg/dL — ABNORMAL HIGH (ref 8–23)
CO2: 24 mmol/L (ref 22–32)
Calcium: 9.4 mg/dL (ref 8.9–10.3)
Chloride: 98 mmol/L (ref 98–111)
Creatinine, Ser: 0.79 mg/dL (ref 0.61–1.24)
GFR, Estimated: >60 mL/min (ref >60)
Glucose, Bld: 367 mg/dL — ABNORMAL HIGH (ref 70–99)
Potassium: 4 mmol/L (ref 3.5–5.1)
Sodium: 135 mmol/L (ref 135–145)
Total Bilirubin: 0.9 mg/dL (ref 0.3–1.2)
Total Protein: 7.6 g/dL (ref 6.5–8.1)

## 2020-01-04 LAB — GLUCOSE, CAPILLARY
Glucose-Capillary: 349 mg/dL — ABNORMAL HIGH (ref 70–99)
Glucose-Capillary: 360 mg/dL — ABNORMAL HIGH (ref 70–99)
Glucose-Capillary: 311 mg/dL — ABNORMAL HIGH (ref 70–99)
Glucose-Capillary: 397 mg/dL — ABNORMAL HIGH (ref 70–99)

## 2020-01-04 LAB — D-DIMER, QUANTITATIVE: D-Dimer, Quant: 0.78 ug/mL-FEU — ABNORMAL HIGH (ref 0.00–0.50)

## 2020-01-04 LAB — MAGNESIUM: Magnesium: 2.1 mg/dL (ref 1.7–2.4)

## 2020-01-04 LAB — C-REACTIVE PROTEIN: CRP: 14 mg/dL — ABNORMAL HIGH (ref <1.0)

## 2020-01-04 LAB — HEMOGLOBIN A1C
Hgb A1c MFr Bld: 8 % — ABNORMAL HIGH (ref 4.8–5.6)
Mean Plasma Glucose: 183 mg/dL

## 2020-01-04 LAB — FERRITIN: Ferritin: 1006 ng/mL — ABNORMAL HIGH (ref 24–336)

## 2020-01-04 MED ORDER — SODIUM CHLORIDE 0.9 % IV SOLN
100.0000 mg | Freq: Once | INTRAVENOUS | Status: AC
  Administered 2020-01-04: 100 mg via INTRAVENOUS
  Filled 2020-01-04: qty 20

## 2020-01-04 MED ORDER — PREDNISONE 20 MG PO TABS: 50.0000 mg | ORAL_TABLET | Freq: Two times a day (BID) | ORAL | Status: DC

## 2020-01-04 MED ORDER — SODIUM CHLORIDE 0.9 % IV SOLN: 100.0000 mg | Freq: Every day | INTRAVENOUS | Status: DC

## 2020-01-04 MED ORDER — PREDNISONE 20 MG PO TABS: 40.0000 mg | ORAL_TABLET | Freq: Two times a day (BID) | ORAL | Status: DC

## 2020-01-04 MED ORDER — PREDNISONE 50 MG PO TABS
50.0000 mg | ORAL_TABLET | Freq: Two times a day (BID) | ORAL | 0 refills | Status: DC
Start: 2020-01-04 — End: 2020-01-04

## 2020-01-04 MED ORDER — PREDNISONE 50 MG PO TABS
50.0000 mg | ORAL_TABLET | Freq: Every day | ORAL | 0 refills | Status: DC
Start: 2020-01-04 — End: 2022-05-01

## 2020-01-04 MED ORDER — METFORMIN HCL 500 MG PO TABS: 500.0000 mg | ORAL_TABLET | Freq: Two times a day (BID) | ORAL | 1 refills | Status: AC

## 2020-01-04 NOTE — Discharge Instructions (Signed)
You are scheduled for an outpatient Remdesivir infusion at 2pm on Tuesday 11/30 and Wednesday 12/1 at [facility]. Please park at [place], as staff will be escorting you through the [place] entrance of the hospital. Appointments take approximately 45 minutes.    The address for the infusion clinic site is:  --GPS address is [place] - the parking is located near [employer] building where you will see  COVID19 Infusion feather banner marking the entrance to parking.   (see photos below)            --Enter into the 2nd entrance where the "wave, flag banner" is at the road. Turn into this 2nd entrance and immediately turn left to park in 1 of the 5 parking spots.   --Please stay in your car and call the desk for assistance inside [phone].   The day of your visit you should: Marland Kitchen Get plenty of rest the night before and drink plenty of water . Eat a light meal/snack before coming and take your medications as prescribed  . Wear warm, comfortable clothes with a shirt that can roll-up over the elbow (will need IV start).  . Wear a mask  . Consider bringing some activity to help pass the time

## 2020-01-04 NOTE — Progress Notes (Signed)
Patient scheduled for outpatient Remdesivir infusions at 2pm on Tuesday 11/30 and Wednesday 12/1 at [facility]. Please inform the patient to park at [place], as staff will be escorting the patient through the [place] entrance of the hospital. Appointments take approximately 45 minutes.    There is a wave flag banner located near the entrance on [address]. Turn into this entrance and immediately turn left or right and park in 1 of the 10 designated Covid Infusion Parking spots. There is a phone number on the sign, please call and let the staff know what spot you are in and we will come out and get you. For questions call [phone].  Thanks.

## 2020-01-05 ENCOUNTER — Other Ambulatory Visit: Payer: Self-pay

## 2020-01-05 ENCOUNTER — Ambulatory Visit (HOSPITAL_COMMUNITY)
Admit: 2020-01-05 | Discharge: 2020-01-05 | Disposition: A | Discharge status: Home / Self Care | Payer: Medicare HMO | Source: Ambulatory Visit | Attending: Pulmonary Disease | Admitting: Pulmonary Disease

## 2020-01-05 DIAGNOSIS — U071 COVID-19: Secondary | ICD-10-CM | POA: Diagnosis not present

## 2020-01-05 DIAGNOSIS — J1282 Pneumonia due to coronavirus disease 2019: Secondary | ICD-10-CM | POA: Diagnosis not present

## 2020-01-05 MED ORDER — FAMOTIDINE IN NACL 20-0.9 MG/50ML-% IV SOLN: 20.0000 mg | Freq: Once | INTRAVENOUS | Status: DC | PRN

## 2020-01-05 MED ORDER — METHYLPREDNISOLONE SODIUM SUCC 125 MG IJ SOLR: 125.0000 mg | Freq: Once | INTRAMUSCULAR | Status: DC | PRN

## 2020-01-05 MED ORDER — SODIUM CHLORIDE 0.9 % IV SOLN
100.0000 mg | Freq: Once | INTRAVENOUS | Status: AC
  Administered 2020-01-05: 100 mg via INTRAVENOUS

## 2020-01-05 MED ORDER — SODIUM CHLORIDE 0.9 % IV SOLN: INTRAVENOUS | Status: DC | PRN

## 2020-01-05 MED ORDER — EPINEPHRINE 0.3 MG/0.3ML IJ SOAJ: 0.3000 mg | Freq: Once | INTRAMUSCULAR | Status: DC | PRN

## 2020-01-05 MED ORDER — DIPHENHYDRAMINE HCL 50 MG/ML IJ SOLN: 50.0000 mg | Freq: Once | INTRAMUSCULAR | Status: DC | PRN

## 2020-01-05 MED ORDER — ALBUTEROL SULFATE HFA 108 (90 BASE) MCG/ACT IN AERS: 2.0000 | INHALATION_SPRAY | Freq: Once | RESPIRATORY_TRACT | Status: DC | PRN

## 2020-01-05 NOTE — Progress Notes (Signed)
  Diagnosis: COVID-19  Physician: Dr. Joya Gaskins  Procedure: Covid Infusion Clinic Med: remdesivir infusion - Provided patient with remdesivir fact sheet for patients, parents and caregivers prior to infusion.  Complications: No immediate complications noted.  Discharge: Discharged home   [name] 01/05/2020

## 2020-01-05 NOTE — Patient Outreach (Signed)
[facility]) Care Management  01/05/2020  [name] 12/01/1940 [id number]     Transition of Care Referral  Referral Date: 01/05/2020 Referral Source: [facility] Discharge Report Date of Discharge: 01/04/2020 Facility: [place]: [facility]    Referral received. Transition of care calls being completed via EMMI-automated calls. RN CM will outreach patient for any red flags received.     Plan: RN CM will close case at this time.    Enzo Montgomery, RN,BSN,CCM [place] Management Telephonic Care Management Coordinator Direct Phone: [phone] Toll Free: [phone] Fax: [phone]

## 2020-01-06 ENCOUNTER — Ambulatory Visit (HOSPITAL_COMMUNITY)
Admit: 2020-01-06 | Discharge: 2020-01-06 | Disposition: A | Discharge status: Home / Self Care | Payer: Medicare HMO | Attending: Pulmonary Disease | Admitting: Pulmonary Disease

## 2020-01-06 DIAGNOSIS — U071 COVID-19: Secondary | ICD-10-CM | POA: Insufficient documentation

## 2020-01-06 MED ORDER — SODIUM CHLORIDE 0.9 % IV SOLN
100.0000 mg | Freq: Once | INTRAVENOUS | Status: AC
  Administered 2020-01-06: 100 mg via INTRAVENOUS
  Filled 2020-01-06: qty 20

## 2020-01-06 MED ORDER — SODIUM CHLORIDE 0.9 % IV SOLN: INTRAVENOUS | Status: DC | PRN

## 2020-01-06 MED ORDER — METHYLPREDNISOLONE SODIUM SUCC 125 MG IJ SOLR: 125.0000 mg | Freq: Once | INTRAMUSCULAR | Status: DC | PRN

## 2020-01-06 MED ORDER — EPINEPHRINE 0.3 MG/0.3ML IJ SOAJ: 0.3000 mg | Freq: Once | INTRAMUSCULAR | Status: DC | PRN

## 2020-01-06 MED ORDER — FAMOTIDINE IN NACL 20-0.9 MG/50ML-% IV SOLN: 20.0000 mg | Freq: Once | INTRAVENOUS | Status: DC | PRN

## 2020-01-06 MED ORDER — ALBUTEROL SULFATE HFA 108 (90 BASE) MCG/ACT IN AERS: 2.0000 | INHALATION_SPRAY | Freq: Once | RESPIRATORY_TRACT | Status: DC | PRN

## 2020-01-06 MED ORDER — DIPHENHYDRAMINE HCL 50 MG/ML IJ SOLN: 50.0000 mg | Freq: Once | INTRAMUSCULAR | Status: DC | PRN

## 2020-01-06 NOTE — Progress Notes (Signed)
  Diagnosis: COVID-19  Physician: Dr. Joya Gaskins  Procedure: Covid Infusion Clinic Med: remdesivir infusion - Provided patient with remdesivir fact sheet for patients, parents and caregivers prior to infusion.  Complications: No immediate complications noted.  Discharge: Discharged home   [name] 01/06/2020

## 2020-01-06 NOTE — Discharge Instructions (Signed)
10 Things You Can Do to Manage Your COVID-19 Symptoms at Home If you have possible or confirmed COVID-19: 1. Stay home from work and school. And stay away from other public places. If you must go out, avoid using any kind of public transportation, ridesharing, or taxis. 2. Monitor your symptoms carefully. If your symptoms get worse, call your healthcare provider immediately. 3. Get rest and stay hydrated. 4. If you have a medical appointment, call the healthcare provider ahead of time and tell them that you have or may have COVID-19. 5. For medical emergencies, call 911 and notify the dispatch personnel that you have or may have COVID-19. 6. Cover your cough and sneezes with a tissue or use the inside of your elbow. 7. Wash your hands often with soap and water for at least 20 seconds or clean your hands with an alcohol-based hand sanitizer that contains at least 60% alcohol. 8. As much as possible, stay in a specific room and away from other people in your home. Also, you should use a separate bathroom, if available. If you need to be around other people in or outside of the home, wear a mask. 9. Avoid sharing personal items with other people in your household, like dishes, towels, and bedding. 10. Clean all surfaces that are touched often, like counters, tabletops, and doorknobs. Use household cleaning sprays or wipes according to the label instructions. cdc.gov/coronavirus 08/06/2018 This information is not intended to replace advice given to you by your health care provider. Make sure you discuss any questions you have with your health care provider. Document Revised: 01/08/2019 Document Reviewed: 01/08/2019 Elsevier Patient Education  2020 Elsevier Inc.  If you have any questions or concerns after the infusion please call the Advanced Practice Provider on call at [phone]. This number is ONLY intended for your use regarding questions or concerns about the infusion post-treatment  side-effects.  Please do not provide this number to others for use. For return to work notes please contact your primary care provider.   If someone you know is interested in receiving treatment please have them call the COVID hotline at [phone].    

## 2020-01-06 NOTE — Progress Notes (Signed)
Patient reviewed Fact Sheet for Patients, Parents, and Caregivers for Emergency Use Authorization (EUA) of Sotrovimab for the Treatment of Coronavirus. Patient also reviewed and is agreeable to the estimated cost of treatment. Patient is agreeable to proceed.   

## 2020-01-07 ENCOUNTER — Other Ambulatory Visit: Payer: Self-pay

## 2020-01-07 LAB — CULTURE, BLOOD (ROUTINE X 2)
Culture: NO GROWTH
Culture: NO GROWTH
Special Requests: ADEQUATE
Special Requests: ADEQUATE

## 2020-01-07 NOTE — Patient Outreach (Signed)
[facility]) Care Management  01/07/2020  [name] 09/10/1940 [id number]    EMMI-General Discharge RED ON EMMI ALERT Day # 1 Date: 01/06/2020 Red Alert Reason: "Scheduled follow-up? No"   Outreach attempt # 1 to patient. No answer after multiple rings and unable to leave message.      Plan: RN CM will make outreach attempt to patient within 3-4 business days. RN CM will send unsuccessful outreach letter to patient.  Enzo Montgomery, RN,BSN,CCM [place] Management Telephonic Care Management Coordinator Direct Phone: [phone] Toll Free: [phone] Fax: [phone]

## 2020-01-08 ENCOUNTER — Other Ambulatory Visit: Payer: Self-pay

## 2020-01-08 NOTE — Patient Outreach (Signed)
[facility]) Care Management  01/08/2020  [name] 1940/09/13 [id number]   EMMI-General Discharge RED ON EMMI ALERT Day # 1 Date: 01/06/2020 Red Alert Reason: "Scheduled follow-up? No"   Outreach attempt #2 to patient. Spoke with patient who denies any acute issues or concerns at present. He voices that he has been doing well since returning home. Patient reports that he remains under quarantine for a few more days for testing positive to COVID-9. He stats that he has not been experiencing sxs. He has gotten IV infusion to treat infection. He plans to go get retested to see if COVID-19 has resolved within the next few days. He as PCP appt on 01/12/20. patient reports that office is aware that patient tested positive. He confirms he has all his meds in the home an no issues or concerns regarding them. He denies any RN CM needs or concerns at this time.     Plan: RN CM will close case at this time.   Enzo Montgomery, RN,BSN,CCM [place] Management Telephonic Care Management Coordinator Direct Phone: [phone] Toll Free: [phone] Fax: [phone]

## 2020-01-12 DIAGNOSIS — E1142 Type 2 diabetes mellitus with diabetic polyneuropathy: Secondary | ICD-10-CM | POA: Diagnosis not present

## 2020-01-12 DIAGNOSIS — I10 Essential (primary) hypertension: Secondary | ICD-10-CM | POA: Diagnosis not present

## 2020-01-12 DIAGNOSIS — J410 Simple chronic bronchitis: Secondary | ICD-10-CM | POA: Diagnosis not present

## 2020-01-12 DIAGNOSIS — U071 COVID-19: Secondary | ICD-10-CM | POA: Diagnosis not present

## 2020-01-21 DIAGNOSIS — I10 Essential (primary) hypertension: Secondary | ICD-10-CM | POA: Diagnosis not present

## 2020-01-21 DIAGNOSIS — N390 Urinary tract infection, site not specified: Secondary | ICD-10-CM | POA: Diagnosis not present

## 2020-01-21 DIAGNOSIS — E1142 Type 2 diabetes mellitus with diabetic polyneuropathy: Secondary | ICD-10-CM | POA: Diagnosis not present

## 2020-01-22 DIAGNOSIS — I10 Essential (primary) hypertension: Secondary | ICD-10-CM | POA: Diagnosis not present

## 2020-01-22 DIAGNOSIS — N390 Urinary tract infection, site not specified: Secondary | ICD-10-CM | POA: Diagnosis not present

## 2020-01-22 DIAGNOSIS — E1142 Type 2 diabetes mellitus with diabetic polyneuropathy: Secondary | ICD-10-CM | POA: Diagnosis not present

## 2020-01-27 DIAGNOSIS — G894 Chronic pain syndrome: Secondary | ICD-10-CM | POA: Diagnosis not present

## 2020-02-11 DIAGNOSIS — L84 Corns and callosities: Secondary | ICD-10-CM | POA: Diagnosis not present

## 2020-02-16 DIAGNOSIS — I10 Essential (primary) hypertension: Secondary | ICD-10-CM | POA: Diagnosis not present

## 2020-02-16 DIAGNOSIS — J410 Simple chronic bronchitis: Secondary | ICD-10-CM | POA: Diagnosis not present

## 2020-02-16 DIAGNOSIS — R251 Tremor, unspecified: Secondary | ICD-10-CM | POA: Diagnosis not present

## 2020-02-16 DIAGNOSIS — E1142 Type 2 diabetes mellitus with diabetic polyneuropathy: Secondary | ICD-10-CM | POA: Diagnosis not present

## 2020-02-18 ENCOUNTER — Other Ambulatory Visit: Payer: Self-pay

## 2020-02-18 ENCOUNTER — Ambulatory Visit (INDEPENDENT_AMBULATORY_CARE_PROVIDER_SITE_OTHER): Payer: Medicare HMO | Admitting: Urology

## 2020-02-18 ENCOUNTER — Encounter: Payer: Self-pay | Admitting: Urology

## 2020-02-18 VITALS — BP 159/110 | HR 88 | Temp 98.0°F | Ht 71.0 in | Wt 187.0 lb

## 2020-02-18 DIAGNOSIS — N401 Enlarged prostate with lower urinary tract symptoms: Secondary | ICD-10-CM

## 2020-02-18 DIAGNOSIS — R351 Nocturia: Secondary | ICD-10-CM

## 2020-02-18 DIAGNOSIS — N390 Urinary tract infection, site not specified: Secondary | ICD-10-CM

## 2020-02-18 DIAGNOSIS — N138 Other obstructive and reflux uropathy: Secondary | ICD-10-CM | POA: Diagnosis not present

## 2020-02-18 DIAGNOSIS — N5201 Erectile dysfunction due to arterial insufficiency: Secondary | ICD-10-CM

## 2020-02-18 DIAGNOSIS — R339 Retention of urine, unspecified: Secondary | ICD-10-CM

## 2020-02-18 LAB — URINALYSIS, ROUTINE W REFLEX MICROSCOPIC
Bilirubin, UA: NEGATIVE
Ketones, UA: NEGATIVE
Nitrite, UA: NEGATIVE
Protein,UA: NEGATIVE
RBC, UA: NEGATIVE
Specific Gravity, UA: 1.02 (ref 1.005–1.030)
Urobilinogen, Ur: 0.2 mg/dL (ref 0.2–1.0)
pH, UA: 7 (ref 5.0–7.5)

## 2020-02-18 LAB — MICROSCOPIC EXAMINATION: Renal Epithel, UA: NONE SEEN /hpf

## 2020-02-18 LAB — BLADDER SCAN AMB NON-IMAGING: Scan Result: 311

## 2020-02-18 MED ORDER — TADALAFIL 5 MG PO TABS: 5.0000 mg | ORAL_TABLET | Freq: Every day | ORAL | 11 refills | Status: DC | PRN

## 2020-02-18 NOTE — Progress Notes (Signed)
Urological Symptom Review  PVR   311    Patient is experiencing the following symptoms: Frequent urination Get up at night to urinate   Review of Systems  Gastrointestinal (upper)  : Negative for upper GI symptoms  Gastrointestinal (lower) : Diarrhea  Constitutional : Negative for symptoms  Skin: Negative for skin symptoms  Eyes: Negative for eye symptoms  Ear/Nose/Throat : Negative for Ear/Nose/Throat symptoms  Hematologic/Lymphatic: Negative for Hematologic/Lymphatic symptoms  Cardiovascular : Negative for cardiovascular symptoms  Respiratory : Negative for respiratory symptoms  Endocrine: Negative for endocrine symptoms  Musculoskeletal: Negative for musculoskeletal symptoms  Neurological: Negative for neurological symptoms  Psychologic: Negative for psychiatric symptoms

## 2020-02-18 NOTE — Progress Notes (Signed)
Subjective:  1. Incomplete bladder emptying   2. BPH with urinary obstruction   3. Nocturia   4. Erectile dysfunction due to arterial insufficiency   5. Urinary tract infection without hematuria, site unspecified      [name] returns today in f/u.  He has increased nocturia to 3-4x daily but he drinks 7-8 beers daily and does better when he backs off.  His IPSS is 6.  He has no dysuria or hematuria.  He has had a prior history of an elevated PSA but the last was down and he has aged out of the need for screening.   He also complains of some progressive ED.  He can get a partial erection with a partner but he has some difficult reaching a climax.   He is a diabetic.  His Hgb A1c was 8.0 in 11/21.     He think he has ok control and he runs around 150.  He had COVID in late November.   He is on hydrocodone for chronic neck pain.   His PVR today is 366ml.    GU Hx; [name] is a 80 yo WM who originally sent in consultation by Dr. Legrand Rams for an elevated PSA of 4.6 in July 2-19. I got a repeat at his visit in 9/19 and it was back down to 2.9 where it was in 5/18.  Because of his age further testing was not recommended at the time of his last visit in 3/20.   He is currently on losartan/HCTZ and metoprolol and since he has been having increased frequency on those meds.  He drinks 4-6 beers daily.   His IPSS is up to 9 from 3.   He has nocturia x 3 and some frequency.   He has no erectile dysfunction.   He has no associated signs or symptoms.   He hasn't been able to get a UA yet.      ROS:  ROS:  A complete review of systems was performed.  All systems are negative except for pertinent findings as noted.   Review of Systems  Genitourinary: Positive for frequency.  Musculoskeletal: Positive for back pain and neck pain.    No Known Allergies  Outpatient Encounter Medications as of 02/18/2020  Medication Sig  . baclofen (LIORESAL) 10 MG tablet Take 1 tablet by mouth daily.  .  Cholecalciferol (VITAMIN D3) 2000 UNITS TABS Take 1 tablet by mouth daily.  Marland Kitchen gabapentin (NEURONTIN) 800 MG tablet Take 800 mg by mouth 3 (three) times daily.  Marland Kitchen glipiZIDE (GLUCOTROL) 5 MG tablet Take 5 mg by mouth 2 (two) times daily before a meal. Pt takes one tablet in the AM  . HYDROcodone-acetaminophen (NORCO) 10-325 MG tablet Take 1 tablet by mouth 4 (four) times daily as needed.  Marland Kitchen ipratropium-albuterol (DUONEB) 0.5-2.5 (3) MG/3ML SOLN Take 0.5 mLs by nebulization 4 (four) times daily as needed.  Marland Kitchen losartan-hydrochlorothiazide (HYZAAR) 50-12.5 MG tablet Take 1 tablet by mouth daily.  . meclizine (ANTIVERT) 25 MG tablet Take 25 mg by mouth 2 (two) times daily as needed for dizziness.  . metFORMIN (GLUCOPHAGE) 500 MG tablet Take 1 tablet (500 mg total) by mouth 2 (two) times daily with a meal. Takes one tablet in the AM  . metoprolol succinate (TOPROL-XL) 50 MG 24 hr tablet Take 50 mg by mouth daily.  . Multiple Vitamins-Minerals (MULTIVITAMIN WITH MINERALS) tablet Take 1 tablet by mouth daily.  Salley Scarlet [facility]  Peripheral Neuropathy Cream- Bupivacaine 1%,  Doxepin 3%, Gabapentin 6%, Pentoxifylline 3%, Topiramate 1% Apply 1-2 grams to affected area 3-4 times daily Qty. 120 gm 3 refills  . predniSONE (DELTASONE) 50 MG tablet Take 1 tablet (50 mg total) by mouth daily with breakfast.  . simvastatin (ZOCOR) 40 MG tablet Take 40 mg by mouth at bedtime.  . tadalafil (CIALIS) 5 MG tablet Take 1 tablet (5 mg total) by mouth daily as needed for erectile dysfunction.   No facility-administered encounter medications on file as of 02/18/2020.    Past Medical History:  Diagnosis Date  . Diabetes mellitus    A1c of 7 -12/2009  . DJD (degenerative joint disease)    left knee,cervical spine,lumbosacral spine  . DVT (deep venous thrombosis) ([place]) 12/2009   left lower extremity  following left TKA  . Hyperlipidemia   . Hypertension   . Obesity     Past Surgical History:   Procedure Laterality Date  . CERVICAL DISCECTOMY     and fusion  . COLONOSCOPY  08/15/2011   Procedure: COLONOSCOPY;  Surgeon: Daneil Dolin, MD;  [facility]: [facility];  Service: Endoscopy;  Laterality: N/A;  11:15 AM  . COLONOSCOPY W/ POLYPECTOMY  2003  . TOTAL KNEE ARTHROPLASTY  2011  . TOTAL KNEE ARTHROPLASTY  03/2008  . TOTAL SHOULDER ARTHROPLASTY  05/2009   right     Social History   Socioeconomic History  . Marital status: Widowed    Spouse name: Not on file  . Number of children: 3  . Years of education: Not on file  . Highest education level: Not on file  Occupational History    Employer: RETIRED  Tobacco Use  . Smoking status: Never Smoker  . Smokeless tobacco: Never Used  Substance and Sexual Activity  . Alcohol use: No  . Drug use: No  . Sexual activity: Not on file  Other Topics Concern  . Not on file  Social History Narrative  . Not on file   Social Determinants of Health   Financial Resource Strain: Not on file  Food Insecurity: Not on file  Transportation Needs: Not on file  Physical Activity: Not on file  Stress: Not on file  Social Connections: Not on file  Intimate Partner Violence: Not on file    History reviewed. No pertinent family history.     Objective: Vitals:   02/18/20 1433  BP: (!) 159/110  Pulse: 88  Temp: 98 F (36.7 C)     Physical Exam  Lab Results:  Results for orders placed or performed in visit on 02/18/20 (from the past 24 hour(s))  Urinalysis, Routine w reflex microscopic     Status: Abnormal   Collection Time: 02/18/20  3:15 PM  Result Value Ref Range   Specific Gravity, UA 1.020 1.005 - 1.030   pH, UA 7.0 5.0 - 7.5   Color, UA Yellow Yellow   Appearance Ur Hazy (A) Clear   Leukocytes,UA 1+ (A) Negative   Protein,UA Negative Negative/Trace   Glucose, UA 1+ (A) Negative   Ketones, UA Negative Negative   RBC, UA Negative Negative   Bilirubin, UA Negative Negative   Urobilinogen, Ur 0.2 0.2 - 1.0 mg/dL    Nitrite, UA Negative Negative   Microscopic Examination See below:    Narrative   Performed at:  [facility] [address] [place], [place]  [id number] Lab Director: Mina Marble MT, Phone:  [phone]  Microscopic Examination     Status: Abnormal   Collection Time: 02/18/20  3:15 PM   Urine  Result Value Ref Range   WBC, UA 11-30 (A) 0 - 5 /hpf   RBC 0-2 0 - 2 /hpf   Epithelial Cells (non renal) 0-10 0 - 10 /hpf   Renal Epithel, UA None seen None seen /hpf   Bacteria, UA Many (A) None seen/Few   Narrative   Performed at:  [facility] [address], [place], [place]  [id number] Lab Director: [place], Phone:  [phone]    BMET No results for input(s): NA, K, CL, CO2, GLUCOSE, BUN, CREATININE, CALCIUM in the last 72 hours. PSA No results found for: PSA No results found for: TESTOSTERONE    Studies/Results: PVR is 328ml on [place] today.   UA reviewed.  He has 11-30 WBC with many bacteria.   Assessment & Plan: Nocturia with BPH with BOO and incomplete emptying with nocturia.   I recommended reducing his beer intake again and making sure his glucose control is good.  I will also try him on tadalafil for daily use and reviewed the side effects and instruction.   Possible UTI:  Urine culture today.   ED.  Tadalafil prescribed as noted above.   Meds ordered this encounter  Medications  . tadalafil (CIALIS) 5 MG tablet    Sig: Take 1 tablet (5 mg total) by mouth daily as needed for erectile dysfunction.    Dispense:  30 tablet    Refill:  11     Orders Placed This Encounter  Procedures  . Microscopic Examination  . Urine Culture  . Urinalysis, Routine w reflex microscopic  . Bladder Scan (Post Void Residual) in office      Return in about 6 weeks (around 03/31/2020) for PVR on return.   CC: Rosita Fire, MD      Irine Seal 02/18/2020

## 2020-02-22 LAB — URINE CULTURE

## 2020-02-23 ENCOUNTER — Other Ambulatory Visit: Payer: Self-pay

## 2020-02-23 ENCOUNTER — Ambulatory Visit: Payer: Medicare HMO | Admitting: Podiatry

## 2020-02-23 DIAGNOSIS — I10 Essential (primary) hypertension: Secondary | ICD-10-CM | POA: Diagnosis not present

## 2020-02-23 DIAGNOSIS — E1142 Type 2 diabetes mellitus with diabetic polyneuropathy: Secondary | ICD-10-CM | POA: Diagnosis not present

## 2020-02-23 DIAGNOSIS — N390 Urinary tract infection, site not specified: Secondary | ICD-10-CM

## 2020-02-23 MED ORDER — SULFAMETHOXAZOLE-TRIMETHOPRIM 800-160 MG PO TABS: 1.0000 | ORAL_TABLET | Freq: Two times a day (BID) | ORAL | 0 refills | Status: DC

## 2020-02-23 NOTE — Progress Notes (Signed)
Attempted to call pt to tell him rx sent in for Bactrim and his culture was positive. No way to leave message on number listed.

## 2020-02-24 NOTE — Progress Notes (Signed)
Pt notified of rx sent to pharmacy

## 2020-02-25 ENCOUNTER — Telehealth: Payer: Self-pay

## 2020-02-25 NOTE — Telephone Encounter (Signed)
Pt notified of urinary infection and rx sent in.

## 2020-02-25 NOTE — Telephone Encounter (Signed)
Attempted to call pt again. No answer. No way to leave message.

## 2020-02-26 DIAGNOSIS — J410 Simple chronic bronchitis: Secondary | ICD-10-CM | POA: Diagnosis not present

## 2020-02-26 DIAGNOSIS — E1142 Type 2 diabetes mellitus with diabetic polyneuropathy: Secondary | ICD-10-CM | POA: Diagnosis not present

## 2020-02-26 DIAGNOSIS — I10 Essential (primary) hypertension: Secondary | ICD-10-CM | POA: Diagnosis not present

## 2020-03-01 ENCOUNTER — Ambulatory Visit: Payer: Medicare HMO | Admitting: Podiatry

## 2020-03-04 ENCOUNTER — Other Ambulatory Visit: Payer: Self-pay | Admitting: Urology

## 2020-03-04 DIAGNOSIS — N390 Urinary tract infection, site not specified: Secondary | ICD-10-CM

## 2020-03-09 ENCOUNTER — Telehealth: Payer: Self-pay

## 2020-03-09 NOTE — Telephone Encounter (Signed)
-----   Message from Irine Seal, MD sent at 03/09/2020  7:52 AM EST ----- I got a refill request for bactrim that I prescribed in mid January.   I don't have a reason associated with the request for the refill and we don't have a f/u UA or culture.  Please check with the patient and see why it was requested.  If he still has symptoms, he can have a refill but if not,  there is no need.

## 2020-03-09 NOTE — Telephone Encounter (Signed)
Attempted to call pt to ask if still having symptoms. Left message to call back.

## 2020-03-09 NOTE — Telephone Encounter (Signed)
Attempted to return call to pt after he called. Left message.

## 2020-03-10 ENCOUNTER — Other Ambulatory Visit: Payer: Self-pay

## 2020-03-10 ENCOUNTER — Telehealth: Payer: Self-pay

## 2020-03-10 DIAGNOSIS — N390 Urinary tract infection, site not specified: Secondary | ICD-10-CM

## 2020-03-10 MED ORDER — SULFAMETHOXAZOLE-TRIMETHOPRIM 800-160 MG PO TABS: 1.0000 | ORAL_TABLET | Freq: Two times a day (BID) | ORAL | 0 refills | Status: DC

## 2020-03-10 NOTE — Telephone Encounter (Signed)
-----   Message from Irine Seal, MD sent at 03/10/2020  1:47 PM EST ----- Regarding: RE: His last PVR was 376ml so I don't want to give him an OAB med.   We could try tamsulosin 0.4mg  po daily #30, but he would need to hold the tadalafil. ----- Message ----- From: Valentina Lucks, LPN Sent: 03/09/252   1:20 PM EST To: Irine Seal, MD  Abx was refilled as pt was still symptomatic but he also c/o increased urgency. He wants to know if he can have med to help with this? ----- Message ----- From: Irine Seal, MD Sent: 03/09/2020   7:53 AM EST To: [facility]  I got a refill request for bactrim that I prescribed in mid January.   I don't have a reason associated with the request for the refill and we don't have a f/u UA or culture.  Please check with the patient and see why it was requested.  If he still has symptoms, he can have a refill but if not,  there is no need.

## 2020-03-10 NOTE — Telephone Encounter (Signed)
Pt notified that he could try Tamsulosin per Dr. Jeffie Pollock but couldn't take the Tadalafil if he did. He doesn't want to try the Tamsulosin now.

## 2020-03-28 DIAGNOSIS — E1142 Type 2 diabetes mellitus with diabetic polyneuropathy: Secondary | ICD-10-CM | POA: Diagnosis not present

## 2020-03-28 DIAGNOSIS — I10 Essential (primary) hypertension: Secondary | ICD-10-CM | POA: Diagnosis not present

## 2020-04-06 NOTE — Progress Notes (Deleted)
Subjective:  1. BPH with urinary obstruction      [name] returns today in f/u.  He has increased nocturia to 3-4x daily but he drinks 7-8 beers daily and does better when he backs off.  His IPSS is 6.  He has no dysuria or hematuria.  He has had a prior history of an elevated PSA but the last was down and he has aged out of the need for screening.   He also complains of some progressive ED.  He can get a partial erection with a partner but he has some difficult reaching a climax.   He is a diabetic.  His Hgb A1c was 8.0 in 11/21.     He think he has ok control and he runs around 150.  He had COVID in late November.   He is on hydrocodone for chronic neck pain.   His PVR today is 367ml.    GU Hx; [name] is a 80 yo WM who originally sent in consultation by Dr. Legrand Rams for an elevated PSA of 4.6 in July 2-19. I got a repeat at his visit in 9/19 and it was back down to 2.9 where it was in 5/18.  Because of his age further testing was not recommended at the time of his last visit in 3/20.   He is currently on losartan/HCTZ and metoprolol and since he has been having increased frequency on those meds.  He drinks 4-6 beers daily.   His IPSS is up to 9 from 3.   He has nocturia x 3 and some frequency.   He has no erectile dysfunction.   He has no associated signs or symptoms.   He hasn't been able to get a UA yet.      ROS:  ROS:  A complete review of systems was performed.  All systems are negative except for pertinent findings as noted.   Review of Systems  Genitourinary: Positive for frequency.  Musculoskeletal: Positive for back pain and neck pain.    No Known Allergies  Outpatient Encounter Medications as of 04/07/2020  Medication Sig  . baclofen (LIORESAL) 10 MG tablet Take 1 tablet by mouth daily.  . Cholecalciferol (VITAMIN D3) 2000 UNITS TABS Take 1 tablet by mouth daily.  Marland Kitchen gabapentin (NEURONTIN) 800 MG tablet Take 800 mg by mouth 3 (three) times daily.  Marland Kitchen glipiZIDE (GLUCOTROL)  5 MG tablet Take 5 mg by mouth 2 (two) times daily before a meal. Pt takes one tablet in the AM  . HYDROcodone-acetaminophen (NORCO) 10-325 MG tablet Take 1 tablet by mouth 4 (four) times daily as needed.  Marland Kitchen ipratropium-albuterol (DUONEB) 0.5-2.5 (3) MG/3ML SOLN Take 0.5 mLs by nebulization 4 (four) times daily as needed.  Marland Kitchen losartan-hydrochlorothiazide (HYZAAR) 50-12.5 MG tablet Take 1 tablet by mouth daily.  . meclizine (ANTIVERT) 25 MG tablet Take 25 mg by mouth 2 (two) times daily as needed for dizziness.  . metFORMIN (GLUCOPHAGE) 500 MG tablet Take 1 tablet (500 mg total) by mouth 2 (two) times daily with a meal. Takes one tablet in the AM  . metoprolol succinate (TOPROL-XL) 50 MG 24 hr tablet Take 50 mg by mouth daily.  . Multiple Vitamins-Minerals (MULTIVITAMIN WITH MINERALS) tablet Take 1 tablet by mouth daily.  [name] [facility]  Peripheral Neuropathy Cream- Bupivacaine 1%, Doxepin 3%, Gabapentin 6%, Pentoxifylline 3%, Topiramate 1% Apply 1-2 grams to affected area 3-4 times daily Qty. 120 gm 3 refills  . predniSONE (DELTASONE) 50 MG tablet  Take 1 tablet (50 mg total) by mouth daily with breakfast.  . simvastatin (ZOCOR) 40 MG tablet Take 40 mg by mouth at bedtime.  . sulfamethoxazole-trimethoprim (BACTRIM DS) 800-160 MG tablet Take 1 tablet by mouth every 12 (twelve) hours.  . tadalafil (CIALIS) 5 MG tablet Take 1 tablet (5 mg total) by mouth daily as needed for erectile dysfunction.   No facility-administered encounter medications on file as of 04/07/2020.    Past Medical History:  Diagnosis Date  . Diabetes mellitus    A1c of 7 -12/2009  . DJD (degenerative joint disease)    left knee,cervical spine,lumbosacral spine  . DVT (deep venous thrombosis) ([place]) 12/2009   left lower extremity  following left TKA  . Hyperlipidemia   . Hypertension   . Obesity     Past Surgical History:  Procedure Laterality Date  . CERVICAL DISCECTOMY     and fusion  .  COLONOSCOPY  08/15/2011   Procedure: COLONOSCOPY;  Surgeon: Daneil Dolin, MD;  Location: [facility];  Service: Endoscopy;  Laterality: N/A;  11:15 AM  . COLONOSCOPY W/ POLYPECTOMY  2003  . TOTAL KNEE ARTHROPLASTY  2011  . TOTAL KNEE ARTHROPLASTY  03/2008  . TOTAL SHOULDER ARTHROPLASTY  05/2009   right     Social History   Socioeconomic History  . Marital status: Widowed    Spouse name: Not on file  . Number of children: 3  . Years of education: Not on file  . Highest education level: Not on file  Occupational History    Employer: RETIRED  Tobacco Use  . Smoking status: Never Smoker  . Smokeless tobacco: Never Used  Substance and Sexual Activity  . Alcohol use: No  . Drug use: No  . Sexual activity: Not on file  Other Topics Concern  . Not on file  Social History Narrative  . Not on file   Social Determinants of Health   Financial Resource Strain: Not on file  Food Insecurity: Not on file  Transportation Needs: Not on file  Physical Activity: Not on file  Stress: Not on file  Social Connections: Not on file  Intimate Partner Violence: Not on file    No family history on file.     Objective: There were no vitals filed for this visit.   Physical Exam  Lab Results:  No results found for this or any previous visit (from the past 24 hour(s)).  BMET No results for input(s): NA, K, CL, CO2, GLUCOSE, BUN, CREATININE, CALCIUM in the last 72 hours. PSA No results found for: PSA No results found for: TESTOSTERONE    Studies/Results: PVR is 322ml on [place] today.   UA reviewed.  He has 11-30 WBC with many bacteria.   Assessment & Plan: Nocturia with BPH with BOO and incomplete emptying with nocturia.   I recommended reducing his beer intake again and making sure his glucose control is good.  I will also try him on tadalafil for daily use and reviewed the side effects and instruction.   Possible UTI:  Urine culture today.   ED.  Tadalafil prescribed as noted  above.   No orders of the defined types were placed in this encounter.    No orders of the defined types were placed in this encounter.     No follow-ups on file.   CC: Rosita Fire, MD      Irine Seal 04/06/2020

## 2020-04-07 ENCOUNTER — Ambulatory Visit: Payer: Medicare HMO | Admitting: Urology

## 2020-04-07 DIAGNOSIS — N138 Other obstructive and reflux uropathy: Secondary | ICD-10-CM

## 2020-04-15 ENCOUNTER — Other Ambulatory Visit: Payer: Self-pay

## 2020-04-15 ENCOUNTER — Ambulatory Visit (INDEPENDENT_AMBULATORY_CARE_PROVIDER_SITE_OTHER): Payer: Medicare HMO | Admitting: Podiatry

## 2020-04-15 DIAGNOSIS — M216X2 Other acquired deformities of left foot: Secondary | ICD-10-CM | POA: Diagnosis not present

## 2020-04-15 DIAGNOSIS — M216X1 Other acquired deformities of right foot: Secondary | ICD-10-CM | POA: Diagnosis not present

## 2020-04-15 NOTE — Progress Notes (Signed)
This patient returns to my office for at risk foot care.  This patient requires this care by a professional since this patient will be at risk due to having peripheral vascular disease and diabetes. Patient presents to the office for evaluation of callus on bottom of both feet.  He says he has been using a pumice stone to work on the callus.  Patient says his doctor told him that was inappropriate for him to work on the callus himself.  Patient has trimmed his own nails.   This patient presents for an evaluation of his diabetic feet..  General Appearance  Alert, conversant and in no acute stress.  Vascular  Dorsalis pedis and posterior tibial  pulses are palpable  bilaterally.  Capillary return is within normal limits  bilaterally. Temperature is within normal limits  bilaterally.  Neurologic  Senn-Weinstein monofilament wire test within normal limits  bilaterally. Muscle power within normal limits bilaterally.  Nails Normal nails with no evidence of bacterial infection or drainage bilaterally.  Orthopedic  No limitations of motion  feet .  No crepitus or effusions noted.  No bony pathology or digital deformities noted. Midfoot  DJD  B/L. Prominent metatarsals  B/L  Skin  normotropic skin with no porokeratosis noted bilaterally.  No signs of infections or ulcers noted.       Callus secondary to prominent metatarsals  B/L.  Consent was obtained for treatment procedures.   Diabetic foot evaluation reveals no vascular or neurological pathology.  Asymptomatic callus due to prominent metatatsals  B/L.    Return office visit  prn                  Told patient to return for periodic foot care and evaluation due to potential at risk complications.   Gardiner Barefoot DPM

## 2020-04-25 ENCOUNTER — Other Ambulatory Visit: Payer: Self-pay

## 2020-04-25 ENCOUNTER — Other Ambulatory Visit (HOSPITAL_COMMUNITY): Payer: Self-pay | Admitting: Gerontology

## 2020-04-25 ENCOUNTER — Ambulatory Visit (HOSPITAL_COMMUNITY)
Admission: RE | Admit: 2020-04-25 | Discharge: 2020-04-25 | Disposition: A | Discharge status: Home / Self Care | Payer: Medicare HMO | Source: Ambulatory Visit | Attending: Gerontology | Admitting: Gerontology

## 2020-04-25 DIAGNOSIS — G894 Chronic pain syndrome: Secondary | ICD-10-CM | POA: Diagnosis not present

## 2020-04-25 DIAGNOSIS — Z79891 Long term (current) use of opiate analgesic: Secondary | ICD-10-CM | POA: Diagnosis not present

## 2020-04-25 DIAGNOSIS — E1142 Type 2 diabetes mellitus with diabetic polyneuropathy: Secondary | ICD-10-CM | POA: Diagnosis not present

## 2020-04-25 DIAGNOSIS — M47812 Spondylosis without myelopathy or radiculopathy, cervical region: Secondary | ICD-10-CM | POA: Diagnosis not present

## 2020-04-25 DIAGNOSIS — M545 Low back pain, unspecified: Secondary | ICD-10-CM | POA: Diagnosis not present

## 2020-04-25 DIAGNOSIS — I10 Essential (primary) hypertension: Secondary | ICD-10-CM | POA: Diagnosis not present

## 2020-04-25 DIAGNOSIS — M5030 Other cervical disc degeneration, unspecified cervical region: Secondary | ICD-10-CM | POA: Diagnosis not present

## 2020-04-29 ENCOUNTER — Telehealth: Payer: Self-pay | Admitting: Urology

## 2020-04-29 ENCOUNTER — Other Ambulatory Visit: Payer: Medicare HMO

## 2020-04-29 ENCOUNTER — Other Ambulatory Visit: Payer: Self-pay

## 2020-04-29 NOTE — Telephone Encounter (Signed)
Patient called the office this morning with complaint of right sided pain and burning during urination.  He is requesting to bring a urine to check for infection.  Patient states that he has trouble providing a urine specimen while here in the office.  He wants to pick up a urine cup and bring the specimen back to the office.  Specimen cup left at the front desk for the patient to pick up.

## 2020-05-02 DIAGNOSIS — N390 Urinary tract infection, site not specified: Secondary | ICD-10-CM | POA: Diagnosis not present

## 2020-05-02 LAB — URINALYSIS, ROUTINE W REFLEX MICROSCOPIC
Bilirubin, UA: NEGATIVE
Glucose, UA: NEGATIVE
Ketones, UA: NEGATIVE
Leukocytes,UA: NEGATIVE
Nitrite, UA: NEGATIVE
Protein,UA: NEGATIVE
RBC, UA: NEGATIVE
Specific Gravity, UA: 1.01 (ref 1.005–1.030)
Urobilinogen, Ur: 0.2 mg/dL (ref 0.2–1.0)
pH, UA: 5.5 (ref 5.0–7.5)

## 2020-05-02 NOTE — Telephone Encounter (Signed)
Patient left urine sample today.  UA was unremarkable. Culture was still sent.

## 2020-05-02 NOTE — Telephone Encounter (Signed)
thanks

## 2020-05-03 ENCOUNTER — Telehealth: Payer: Self-pay

## 2020-05-03 NOTE — Telephone Encounter (Signed)
Patient wanting to know if he can see another doctor since he saw Dr. Jeffie Pollock only 2 times.  He is in a lot of pain and wants to he seen by someone else.  He wold like a call back asap to let him know at [phone].  Thanks, Helene Kelp

## 2020-05-03 NOTE — Telephone Encounter (Signed)
Returned pts call. No answer. Left message to return call.

## 2020-05-04 ENCOUNTER — Other Ambulatory Visit: Payer: Self-pay

## 2020-05-04 ENCOUNTER — Encounter: Payer: Self-pay | Admitting: Urology

## 2020-05-04 ENCOUNTER — Ambulatory Visit (INDEPENDENT_AMBULATORY_CARE_PROVIDER_SITE_OTHER): Payer: Medicare HMO | Admitting: Urology

## 2020-05-04 VITALS — BP 159/76 | HR 58 | Temp 98.3°F | Ht 71.0 in | Wt 197.0 lb

## 2020-05-04 DIAGNOSIS — R300 Dysuria: Secondary | ICD-10-CM | POA: Diagnosis not present

## 2020-05-04 DIAGNOSIS — R1031 Right lower quadrant pain: Secondary | ICD-10-CM | POA: Diagnosis not present

## 2020-05-04 LAB — URINALYSIS, ROUTINE W REFLEX MICROSCOPIC
Bilirubin, UA: NEGATIVE
Glucose, UA: NEGATIVE
Ketones, UA: NEGATIVE
Leukocytes,UA: NEGATIVE
Nitrite, UA: NEGATIVE
Protein,UA: NEGATIVE
RBC, UA: NEGATIVE
Specific Gravity, UA: 1.01 (ref 1.005–1.030)
Urobilinogen, Ur: 0.2 mg/dL (ref 0.2–1.0)
pH, UA: 6.5 (ref 5.0–7.5)

## 2020-05-04 LAB — URINE CULTURE

## 2020-05-04 MED ORDER — DOXYCYCLINE HYCLATE 100 MG PO CAPS: 100.0000 mg | ORAL_CAPSULE | Freq: Two times a day (BID) | ORAL | 0 refills | Status: DC

## 2020-05-04 NOTE — Progress Notes (Signed)
05/04/2020 10:55 AM   [name] 01-28-1941 [id number]  Referring provider: Rosita Fire, MD [place],  [place] [address]  Chief Complaint  Patient presents with  . Other    Pain in right groin area    HPI: [name] is a 80yo here for followup for BPH. He has new right groin pain and worsening LUTS since last visit. He has ben having dysuria for the past 2-3 weeks. He has a weaker stream, urinary urgency, urinary frequency, and straining to urinate. He has right groin pain that has been present for 2-3 weeks. It is no related to activity. He has intermittent tenderness of his right testis. No trauma or injury    PMH: Past Medical History:  Diagnosis Date  . Diabetes mellitus    A1c of 7 -12/2009  . DJD (degenerative joint disease)    left knee,cervical spine,lumbosacral spine  . DVT (deep venous thrombosis) ([place]) 12/2009   left lower extremity  following left TKA  . Hyperlipidemia   . Hypertension   . Obesity     Surgical History: Past Surgical History:  Procedure Laterality Date  . CERVICAL DISCECTOMY     and fusion  . COLONOSCOPY  08/15/2011   Procedure: COLONOSCOPY;  Surgeon: Daneil Dolin, MD;  Location: [facility];  Service: Endoscopy;  Laterality: N/A;  11:15 AM  . COLONOSCOPY W/ POLYPECTOMY  2003  . TOTAL KNEE ARTHROPLASTY  2011  . TOTAL KNEE ARTHROPLASTY  03/2008  . TOTAL SHOULDER ARTHROPLASTY  05/2009   right     Home Medications:  Allergies as of 05/04/2020   No Known Allergies     Medication List       Accurate as of May 04, 2020 10:55 AM. If you have any questions, ask your nurse or doctor.        STOP taking these medications   sulfamethoxazole-trimethoprim 800-160 MG tablet Commonly known as: BACTRIM DS Stopped by: Nicolette Bang, MD     TAKE these medications   baclofen 10 MG tablet Commonly known as: LIORESAL Take 1 tablet by mouth daily.   gabapentin 800 MG tablet Commonly known as:  NEURONTIN Take 800 mg by mouth 3 (three) times daily.   glipiZIDE 5 MG tablet Commonly known as: GLUCOTROL Take 5 mg by mouth 2 (two) times daily before a meal. Pt takes one tablet in the AM   HYDROcodone-acetaminophen 10-325 MG tablet Commonly known as: NORCO Take 1 tablet by mouth 4 (four) times daily as needed.   ipratropium-albuterol 0.5-2.5 (3) MG/3ML Soln Commonly known as: DUONEB Take 0.5 mLs by nebulization 4 (four) times daily as needed.   losartan-hydrochlorothiazide 50-12.5 MG tablet Commonly known as: HYZAAR Take 1 tablet by mouth daily.   meclizine 25 MG tablet Commonly known as: ANTIVERT Take 25 mg by mouth 2 (two) times daily as needed for dizziness.   metFORMIN 500 MG tablet Commonly known as: GLUCOPHAGE Take 1 tablet (500 mg total) by mouth 2 (two) times daily with a meal. Takes one tablet in the AM   metoprolol succinate 50 MG 24 hr tablet Commonly known as: TOPROL-XL Take 50 mg by mouth daily.   multivitamin with minerals tablet Take 1 tablet by mouth daily.   NON FORMULARY [facility]  Peripheral Neuropathy Cream- Bupivacaine 1%, Doxepin 3%, Gabapentin 6%, Pentoxifylline 3%, Topiramate 1% Apply 1-2 grams to affected area 3-4 times daily Qty. 120 gm 3 refills   predniSONE 50 MG tablet Commonly known as: DELTASONE  Take 1 tablet (50 mg total) by mouth daily with breakfast.   simvastatin 40 MG tablet Commonly known as: ZOCOR Take 40 mg by mouth at bedtime.   tadalafil 5 MG tablet Commonly known as: CIALIS Take 1 tablet (5 mg total) by mouth daily as needed for erectile dysfunction.   Vitamin D3 50 MCG (2000 UT) Tabs Take 1 tablet by mouth daily.       Allergies: No Known Allergies  Family History: No family history on file.  Social History:  reports that he has never smoked. He has never used smokeless tobacco. He reports that he does not drink alcohol and does not use drugs.  ROS: All other review of systems were reviewed and  are negative except what is noted above in HPI  Physical Exam: BP (!) 159/76   Pulse (!) 58   Temp 98.3 F (36.8 C)   Ht 5\' 11"  (1.803 m)   Wt 197 lb (89.4 kg)   BMI 27.48 kg/m   Constitutional:  Alert and oriented, No acute distress. HEENT: [place] AT, moist mucus membranes.  Trachea midline, no masses. Cardiovascular: No clubbing, cyanosis, or edema. Respiratory: Normal respiratory effort, no increased work of breathing. GI: Abdomen is soft, nontender, nondistended, no abdominal masses GU: No CVA tenderness. Circumcised phallus. No masses/lesions on penis, testis, scrotum. Prostate 40g smooth no nodules no induration, mildly tender.  Lymph: No cervical or inguinal lymphadenopathy. Skin: No rashes, bruises or suspicious lesions. Neurologic: Grossly intact, no focal deficits, moving all 4 extremities. Psychiatric: Normal mood and affect.  Laboratory Data: Lab Results  Component Value Date   WBC 13.7 (H) 01/04/2020   HGB 14.6 01/04/2020   HCT 42.2 01/04/2020   MCV 87.9 01/04/2020   PLT 307 01/04/2020    Lab Results  Component Value Date   CREATININE 0.79 01/04/2020    No results found for: PSA  No results found for: TESTOSTERONE  Lab Results  Component Value Date   HGBA1C 8.0 (H) 01/02/2020    Urinalysis    Component Value Date/Time   COLORURINE YELLOW 12/23/2009 0151   APPEARANCEUR Clear 05/02/2020 0903   LABSPEC 1.020 12/23/2009 0151   PHURINE 7.5 12/23/2009 0151   GLUCOSEU Negative 05/02/2020 0903   HGBUR NEGATIVE 12/23/2009 0151   BILIRUBINUR Negative 05/02/2020 0903   KETONESUR 40 (A) 12/23/2009 0151   PROTEINUR Negative 05/02/2020 0903   PROTEINUR NEGATIVE 12/23/2009 0151   UROBILINOGEN 1.0 12/23/2009 0151   NITRITE Negative 05/02/2020 0903   NITRITE NEGATIVE 12/23/2009 0151   LEUKOCYTESUR Negative 05/02/2020 0903    Lab Results  Component Value Date   LABMICR Comment 05/02/2020   WBCUA 11-30 (A) 02/18/2020   LABEPIT 0-10 02/18/2020   BACTERIA  Many (A) 02/18/2020    Pertinent Imaging:  No results found for this or any previous visit.  No results found for this or any previous visit.  No results found for this or any previous visit.  No results found for this or any previous visit.  No results found for this or any previous visit.  No results found for this or any previous visit.  No results found for this or any previous visit.  No results found for this or any previous visit.   Assessment & Plan:    1. Dysuria -likely related to prostatitis. We will treat with doxycycline 100mg  BID  2. Right inguinal pain -motrin 800mg  TID PRN  No follow-ups on file.  Nicolette Bang, MD  [facility] [place]

## 2020-05-04 NOTE — Progress Notes (Signed)
Urological Symptom Review  Patient is experiencing the following symptoms: Frequent urination Get up at night to urinate Erection problems (male only)   Review of Systems  Gastrointestinal (upper)  : Negative for upper GI symptoms  Gastrointestinal (lower) : Diarrhea  Constitutional : Negative for symptoms  Skin: Negative for skin symptoms  Eyes: Negative for eye symptoms  Ear/Nose/Throat : Sinus problems  Hematologic/Lymphatic: Negative for Hematologic/Lymphatic symptoms  Cardiovascular : Negative for cardiovascular symptoms  Respiratory : Negative for respiratory symptoms  Endocrine: Negative for endocrine symptoms  Musculoskeletal: Negative for musculoskeletal symptoms  Neurological: Negative for neurological symptoms  Psychologic: Negative for psychiatric symptoms

## 2020-05-04 NOTE — Patient Instructions (Signed)
Prostatitis  Prostatitis is swelling of the prostate gland, also called the prostate. This gland is about 1.5 inches wide and 1 inch high, and it helps to make a fluid called semen. The prostate is below a man's bladder, in front of the butt (rectum). There are different types of prostatitis. What are the causes? One type of prostatitis is caused by an infection from germs (bacteria). Another type is not caused by germs. It may be caused by:  Things having to do with the nervous system. This system includes thebrain, spinal cord, and nerves.  An autoimmune response. This happens when the body's disease-fighting system attacks healthy tissue in the body by mistake.  Psychological factors. These have to do with how the mind works. The causes of other types of prostatitis are normally not known. What are the signs or symptoms? Symptoms of this condition depend on the type of prostatitis you have. If your condition is caused by germs:  You may feel pain or burning when you pee (urinate).  You may pee often and all of a sudden.  You may have problems starting to pee.  You may have trouble emptying your bladder when you pee.  You may have fever or chills.  You may feel pain in your muscles, joints, low back, or lower belly. If you have other types of prostatitis:  You may pee often or all of a sudden.  You may have trouble starting to pee.  You may have a weak flow when you pee.  You may leak pee after using the bathroom.  You may have other problems, such as: ? Abnormal fluid coming from the penis. ? Pain in the testicles or penis. ? Pain between the butt and the testicles. ? Pain when fluid comes out of the penis during sex. How is this treated? Treatment for this condition depends on the type of prostatitis. Treatment may include:  Medicines. These may treat pain or swelling, or they may help relax muscles.  Exercises to help you move better or get stronger (physical  therapy).  Heat therapy.  Techniques to help you control some of the ways that your body works.  Exercises to help you relax.  Antibiotic medicine, if your condition is caused by germs.  Warm water baths (sitz baths) to relax muscles. Follow these instructions at home: Medicines  Take over-the-counter and prescription medicines only as told by your doctor.  If you were prescribed an antibiotic medicine, take it as told by your doctor. Do not stop using the antibiotic even if you start to feel better. Managing pain and swelling  Take sitz baths as told by your doctor. For a sitz bath, sit in warm water that is deep enough to cover your hips and butt.  If told, put heat on the painful area. Do this as often as told by your doctor. Use the heat source that your doctor recommends, such as a moist heat pack or a heating pad. ? Place a towel between your skin and the heat source. ? Leave the heat on for 20-30 minutes. ? Take off the heat if your skin turns bright red. This is very important if you are unable to feel pain, heat, or cold. You may have a greater risk of getting burned.   General instructions  Do exercises as told by your doctor, if your doctor prescribed them.  Keep all follow-up visits as told by your doctor. This is important. Where to find more information  [employer]  of Diabetes and Digestive and Kidney Diseases: http://www.bass.com/ Contact a doctor if:  Your symptoms get worse.  You have a fever. Get help right away if:  You have chills.  You feel light-headed.  You feel like you may faint.  You cannot pee.  You have blood or clumps of blood (blood clots) in your pee. Summary  Prostatitis is swelling of the prostate gland.  There are different types of prostatitis. Treatment depends on the type that you have.  Take over-the-counter and prescription medicines only as told by your doctor.  Get help right away of you have chills, feel  light-headed, or feel like you may faint. Also get help right away if you cannot pee or you have blood or clumps of blood in your pee. This information is not intended to replace advice given to you by your health care provider. Make sure you discuss any questions you have with your health care provider. Document Revised: 02/27/2019 Document Reviewed: 02/27/2019 Elsevier Patient Education  2021 [employer].

## 2020-05-04 NOTE — Telephone Encounter (Signed)
Spoke with Dr. Alyson Ingles. Pt scheduled to be seen today.

## 2020-05-04 NOTE — Telephone Encounter (Signed)
Pt called back again this morning wanting to be seen by a doctor. [name] has been hurting for two weeks on R side. Advised pt to go to ER.

## 2020-05-12 ENCOUNTER — Telehealth: Payer: Self-pay

## 2020-05-26 DIAGNOSIS — E1142 Type 2 diabetes mellitus with diabetic polyneuropathy: Secondary | ICD-10-CM | POA: Diagnosis not present

## 2020-05-26 DIAGNOSIS — I10 Essential (primary) hypertension: Secondary | ICD-10-CM | POA: Diagnosis not present

## 2020-05-27 NOTE — Telephone Encounter (Signed)
See prior note

## 2020-06-08 ENCOUNTER — Ambulatory Visit: Payer: Medicare HMO | Admitting: Urology

## 2020-06-13 DIAGNOSIS — M9979 Connective tissue and disc stenosis of intervertebral foramina of abdomen and other regions: Secondary | ICD-10-CM | POA: Diagnosis not present

## 2020-06-13 DIAGNOSIS — Z1331 Encounter for screening for depression: Secondary | ICD-10-CM | POA: Diagnosis not present

## 2020-06-13 DIAGNOSIS — Z1389 Encounter for screening for other disorder: Secondary | ICD-10-CM | POA: Diagnosis not present

## 2020-06-13 DIAGNOSIS — Z0001 Encounter for general adult medical examination with abnormal findings: Secondary | ICD-10-CM | POA: Diagnosis not present

## 2020-06-13 DIAGNOSIS — E1142 Type 2 diabetes mellitus with diabetic polyneuropathy: Secondary | ICD-10-CM | POA: Diagnosis not present

## 2020-06-13 DIAGNOSIS — I10 Essential (primary) hypertension: Secondary | ICD-10-CM | POA: Diagnosis not present

## 2020-06-17 DIAGNOSIS — Z79899 Other long term (current) drug therapy: Secondary | ICD-10-CM | POA: Diagnosis not present

## 2020-06-17 DIAGNOSIS — Z0001 Encounter for general adult medical examination with abnormal findings: Secondary | ICD-10-CM | POA: Diagnosis not present

## 2020-06-17 DIAGNOSIS — E1142 Type 2 diabetes mellitus with diabetic polyneuropathy: Secondary | ICD-10-CM | POA: Diagnosis not present

## 2020-06-17 DIAGNOSIS — I10 Essential (primary) hypertension: Secondary | ICD-10-CM | POA: Diagnosis not present

## 2020-06-20 ENCOUNTER — Encounter: Payer: Self-pay | Admitting: Neurology

## 2020-06-20 ENCOUNTER — Ambulatory Visit: Payer: Medicare HMO | Admitting: Neurology

## 2020-06-20 VITALS — BP 150/92 | HR 94 | Ht 71.0 in | Wt 188.3 lb

## 2020-06-20 DIAGNOSIS — R251 Tremor, unspecified: Secondary | ICD-10-CM

## 2020-06-20 NOTE — Patient Instructions (Signed)
It was nice to see you again today.  I am sorry for the loss of your partner Neoma Laming.   Your tremor seems to be fairly stable compared to when we first met.  It is primarily in the left hand and more noticeable when you try to write.  Again, I do not see any signs of Parkinson's disease, which is of course reassuring.  For your tremor, I would not recommend any new medication for fear of side effects (especially sleepiness and balance issues).   Please remember, that any kind of tremor may be exacerbated by anxiety, anger, nervousness, excitement, dehydration, sleep deprivation, by caffeine, and low blood sugar values or blood sugar fluctuations and thyroid disease.  You had blood work last week through your primary care physician.  We will do a brain scan, called MRI and call you with the test results. We will have to schedule you for this on a separate date. This test requires authorization from your insurance, and we will take care of the insurance process. In preparation of your brain MRI we will check your kidney function with a blood test today.  We will call you with your results.

## 2020-06-20 NOTE — Progress Notes (Signed)
Subjective:    Patient ID: [name] is a 80 y.o. male.  HPI     Interim history:    [name] is a 80 year old left-handed gentleman with an underlying medical history of degenerative disc disease, cervical spondylosis with status post surgery and neck injections, diabetes, history of DVT, hypertension, hyperlipidemia and obesity, who presents for reevaluation of his tremors.  The patient is unaccompanied today.  I first met him on 11/25/2018 at the request of Dr. Maryjean Ka, at which time the patient reported a approximately 58-monthhistory of hand tremors affecting primarily the left hand.  He was advised not to start any new medications and increase his water intake as he was not hydrating very well, estimated that he drank less than 1 cup of water per day.  He was drinking alcohol in the form of beer daily, about 4-5 beers per day.  He was advised to reduce his alcohol intake.  He called in December 2020 reporting a hand tremor.  He was as to recommendations from our office visit in October 2020.  Today, 06/20/2020: He reports that his tremor seems to have become worse in the past 6 or 7 months.  He lost his partner, Nicholas Lamingin September 2021.  He reports that he had stopped drinking alcohol regularly in 2021 but after he lost her, he started drinking beer again.  Sometimes he can go a day or 2 without, on average he will drink anywhere from 3-6 daily.  He tries to hydrate well with water and likes to drink Gatorade.  He had blood work through his primary care last week but has not been given results yet.  A1c last year per his report was around we will write above 7.2.  He tries to eat right, admits that Nicholas Lamingused to do a better job with cooking.  He had recently seen a neurosurgeon for ongoing issues with neck pain and had received injections into the neck but surgery was not recommended.  He has had neck pain particularly when turning his head to the right.  He was hospitalized in November  2021 with acute respiratory failure secondary to COVID-19. He was treated with remdesivir and steroids.  He has not had any recent falls.  The patient's allergies, current medications, family history, past medical history, past social history, past surgical history and problem list were reviewed and updated as appropriate.   Previously:   11/25/18: (He) reports an intermittent tremor in both hands, left more than right for the past 2 months.  He denies a family history of tremors.  I reviewed your office note from 10/08/2018.  He received a cervical spine injection at the time.  He reports epidural steroid injection helped for about 2 weeks.  He has another appointment for a procedure coming up.  He is the third youngest of a total of 10 siblings, all his 4 brothers passed away.  He had 5 sisters, 2 passed away.  Neither sibling had a tremor, he had 3 sons, lost 1 at the age of 89due to a car accident.  He does not drink a whole lot of water, admits to drinking less than 1 cup of water per day on average, drinks sips of water to get his medicines.  He drinks 2 cups of coffee per day, does admit to drinking beer on a regular basis, sometimes as many as 4 or 5/day, does not drink daily but has a Franklin-reported history of heavy alcohol use in the  past for years, including liquor.  He reports that his primary care physician of 30 years has cautioned him repeatedly regarding his alcohol consumption and warned him regarding liver cirrhosis.  The patient is retired.  He is widowed, he lives alone.  He denies any falls.  The tremor does not bother him on a day-to-day basis, he noticed the tremor primarily when he tries to drink something with the left hand.  His Past Medical History Is Significant For: Past Medical History:  Diagnosis Date  . Diabetes mellitus    A1c of 7 -12/2009  . DJD (degenerative joint disease)    left knee,cervical spine,lumbosacral spine  . DVT (deep venous thrombosis) ([place]) 12/2009    left lower extremity  following left TKA  . Hyperlipidemia   . Hypertension   . Obesity     His Past Surgical History Is Significant For: Past Surgical History:  Procedure Laterality Date  . CERVICAL DISCECTOMY     and fusion  . COLONOSCOPY  08/15/2011   Procedure: COLONOSCOPY;  Surgeon: Daneil Dolin, MD;  [facility];  Service: Endoscopy;  Laterality: N/A;  11:15 AM  . COLONOSCOPY W/ POLYPECTOMY  2003  . TOTAL KNEE ARTHROPLASTY  2011  . TOTAL KNEE ARTHROPLASTY  03/2008  . TOTAL SHOULDER ARTHROPLASTY  05/2009   right     His Family History Is Significant For: History reviewed. No pertinent family history.  His Social History Is Significant For: Social History   Socioeconomic History  . Marital status: Widowed    Spouse name: Not on file  . Number of children: 3  . Years of education: Not on file  . Highest education level: Not on file  Occupational History    Employer: RETIRED  Tobacco Use  . Smoking status: Never Smoker  . Smokeless tobacco: Never Used  Substance and Sexual Activity  . Alcohol use: No  . Drug use: No  . Sexual activity: Not on file  Other Topics Concern  . Not on file  Social History Narrative  . Not on file   Social Determinants of Health   Financial Resource Strain: Not on file  Food Insecurity: Not on file  Transportation Needs: Not on file  Physical Activity: Not on file  Stress: Not on file  Social Connections: Not on file    His Allergies Are:  No Known Allergies:   His Current Medications Are:  Outpatient Encounter Medications as of 06/20/2020  Medication Sig  . Cholecalciferol (VITAMIN D3) 2000 UNITS TABS Take 1 tablet by mouth daily.  Marland Kitchen doxycycline (VIBRAMYCIN) 100 MG capsule Take 1 capsule (100 mg total) by mouth 2 (two) times daily.  Marland Kitchen gabapentin (NEURONTIN) 800 MG tablet Take 800 mg by mouth 3 (three) times daily.  Marland Kitchen glipiZIDE (GLUCOTROL) 5 MG tablet Take 5 mg by mouth 2 (two) times daily before a meal. Pt takes  one tablet in the AM  . losartan-hydrochlorothiazide (HYZAAR) 50-12.5 MG tablet Take 1 tablet by mouth daily.  . metFORMIN (GLUCOPHAGE) 500 MG tablet Take 1 tablet (500 mg total) by mouth 2 (two) times daily with a meal. Takes one tablet in the AM  . Multiple Vitamins-Minerals (MULTIVITAMIN WITH MINERALS) tablet Take 1 tablet by mouth daily.  Salley Scarlet FORMUL[facility]  Peripheral Neuropathy Cream- Bupivacaine 1%, Doxepin 3%, Gabapentin 6%, Pentoxifylline 3%, Topiramate 1% Apply 1-2 grams to affected area 3-4 times daily Qty. 120 gm 3 refills  . simvastatin (ZOCOR) 40 MG tablet Take 40 mg by  mouth at bedtime.  . baclofen (LIORESAL) 10 MG tablet Take 1 tablet by mouth daily. (Patient not taking: No sig reported)  . HYDROcodone-acetaminophen (NORCO) 10-325 MG tablet Take 1 tablet by mouth 4 (four) times daily as needed. (Patient not taking: Reported on 06/20/2020)  . ipratropium-albuterol (DUONEB) 0.5-2.5 (3) MG/3ML SOLN Take 0.5 mLs by nebulization 4 (four) times daily as needed. (Patient not taking: No sig reported)  . meclizine (ANTIVERT) 25 MG tablet Take 25 mg by mouth 2 (two) times daily as needed for dizziness. (Patient not taking: No sig reported)  . metoprolol succinate (TOPROL-XL) 50 MG 24 hr tablet Take 50 mg by mouth daily. (Patient not taking: Reported on 06/20/2020)  . predniSONE (DELTASONE) 50 MG tablet Take 1 tablet (50 mg total) by mouth daily with breakfast. (Patient not taking: No sig reported)  . tadalafil (CIALIS) 5 MG tablet Take 1 tablet (5 mg total) by mouth daily as needed for erectile dysfunction. (Patient not taking: No sig reported)   No facility-administered encounter medications on file as of 06/20/2020.  :  Review of Systems:  Out of a complete 14 point review of systems, all are reviewed and negative with the exception of these symptoms as listed below:  Review of Systems  Neurological:       Here for follow up on his tremor. Pt reports sx have increased  since his last visit. Reports left hand is more noticeable. He is left hand dominate.     Objective:.sanorm  Neurological Exam  Physical Exam  Physical Examination:   Vitals:   06/20/20 1306  BP: (!) 150/92  Pulse: 94    General Examination: The patient is a very pleasant 80 y.o. male in no acute distress. He appears well-developed and well-nourished and well groomed.   HEENT: Normocephalic, atraumatic, pupils are equal, round and reactive to light, extraocular tracking is preserved, face is symmetric, no facial masking noted.  Speech is clear without dysarthria or hypophonia.  Hearing is grossly intact, perhaps mildly impaired.  He has no lip, neck or jaw tremor, neck is supple but he does have decreased range of motion actively and passively.  He has no carotid bruits.  Airway examination reveals moderate mouth dryness and partial dentures on top, tongue protrudes centrally in palate elevates symmetrically.  He has several missing teeth on the bottom. He admits that so far he has only had coffee in the morning, not much in the way of water today.   Chest: Clear to auscultation without wheezing, rhonchi or crackles noted.  Heart: S1+S2+0, regular and normal without murmurs, rubs or gallops noted.   Abdomen: Soft, non-tender and non-distended with normal bowel sounds.  Extremities: There is no pitting edema in the distal lower extremities bilaterally.  Skin: Warm and dry with Chronic appearing changes on both hands in keeping with chronic sun exposure.  Musculoskeletal: exam reveals no obvious joint deformities, tenderness or joint swelling or erythema.   Neurologically:  Mental status: The patient is awake, alert and oriented in all 4 spheres. His immediate and remote memory, attention, language skills and fund of knowledge are appropriate. There is no evidence of aphasia, agnosia, apraxia or anomia. Speech is clear with normal prosody and enunciation. Thought process is  linear. Mood is normal and affect is normal.  Cranial nerves II - XII are as described above under HEENT exam.  Motor exam: Normal bulk, strength and tone is noted. There is no drift, resting tremor or rebound.   (On 11/25/2018: On  Archimedes spiral drawing he has slight trembling with the left hand, no trembling with the right hand, handwriting with the left hand which is his dominant hand Is not tremulous, not micrographic and legible.)  He has a mild and intermittent left upper extremity postural tremor, no action tremor, no intention tremor.  He has no tremor In the right upper extremity or both lower extremities. Reflexes are 1+ throughout, trace in the ankles. Fine motor skills and coordination: intact with normal finger taps, normal hand movements, normal rapid alternating patting, normal foot taps and normal foot agility.  Cerebellar testing: No dysmetria or intention tremor. There is no truncal or gait ataxia.  Sensory exam: intact to light touch in the upper and lower extremities.  Gait, station and balance: He stands easily. No veering to one side is noted. No leaning to one side is noted. Posture is age-appropriate and stance is narrow based. Gait shows normal stride length and normal pace. No problems turning are noted. No shuffling noted, preserved arm swing.  Assessment and Plan:   In summary, [name] is a very pleasant 80 year old male with an underlying medical history of degenerative disc disease, cervical spondylosis with status post neck surgery, status post neck injections, diabetes, history of DVT, hypertension, hyperlipidemia and obesity, who presents for follow-up consultation of his tremor, particularly affecting his left hand.  Examination shows fairly stable findings, no evidence of parkinsonism.  He is reassured in that regard.  He has noticed a worsening of his tremor after he lost his partner of many years, Neoma Franklin.  He does admit to having more stress and he has  restarted drinking beer on a regular, nearly daily basis.  He is again advised to be very cautious with the alcohol because he takes high-dose gabapentin and could have interaction with alcohol.  He is at risk of balance issues and sleepiness.  He admits that sometimes he has had lightheadedness when he stood up too quickly.  His blood pressure is sslightly elevated today and he reports that he no longer takes metoprolol.  He is advised to talk to his primary care physician about medical management.  He recently had blood work through his primary care, we will proceed with a brain MRI to rule out a structural cause of his tremor but I would be very reluctant to suggest any symptomatic treatment for fear of side effects particularly causing balance issues and sleepiness.    He is advised to stay well-hydrated with water.  He is furthermore advised to reduce his in alcohol consumption.  We will call him with his brain MRI results.  So long as his MRI shows age-appropriate and stable appearing findings, we can see him back on an as-needed basis.  We talked about the importance of fall prevention and maintaining healthy lifestyle.  I answered all his questions today and he was in agreement. I spent 32 minutes in total face-to-face time and in reviewing records during pre-charting, more than 50% of which was spent in counseling and coordination of care, reviewing test results, reviewing medications and treatment regimen and/or in discussing or reviewing the diagnosis of Tremor, the prognosis and treatment options. Pertinent laboratory and imaging test results that were available during this visit with the patient were reviewed by me and considered in my medical decision making (see chart for details).

## 2020-06-21 ENCOUNTER — Telehealth: Payer: Self-pay | Admitting: Neurology

## 2020-06-21 LAB — COMPREHENSIVE METABOLIC PANEL
ALT: 19 IU/L (ref 0–44)
AST: 31 IU/L (ref 0–40)
Albumin/Globulin Ratio: 2.2 (ref 1.2–2.2)
Albumin: 4.8 g/dL — ABNORMAL HIGH (ref 3.7–4.7)
Alkaline Phosphatase: 53 IU/L (ref 44–121)
BUN/Creatinine Ratio: 15 (ref 10–24)
BUN: 15 mg/dL (ref 8–27)
Bilirubin Total: 0.8 mg/dL (ref 0.0–1.2)
CO2: 24 mmol/L (ref 20–29)
Calcium: 10.5 mg/dL — ABNORMAL HIGH (ref 8.6–10.2)
Chloride: 100 mmol/L (ref 96–106)
Creatinine, Ser: 0.99 mg/dL (ref 0.76–1.27)
Globulin, Total: 2.2 g/dL (ref 1.5–4.5)
Glucose: 107 mg/dL — ABNORMAL HIGH (ref 65–99)
Potassium: 4.4 mmol/L (ref 3.5–5.2)
Sodium: 141 mmol/L (ref 134–144)
Total Protein: 7 g/dL (ref 6.0–8.5)
eGFR: 78 mL/min/{1.73_m2} (ref >59)

## 2020-06-21 NOTE — Telephone Encounter (Signed)
Larene Beach with [facility] informed me that Mr. Spurgeon is scheduled for open MRI on 5/24 at 2:30.

## 2020-06-21 NOTE — Telephone Encounter (Signed)
-----   Message from Star Age, MD sent at 06/21/2020 10:06 AM EDT ----- Kidney function is okay, please update patient that we can proceed with a brain MRI as discussed.

## 2020-06-21 NOTE — Telephone Encounter (Addendum)
Received approval for MRI Brain from [facility] #[id number] (06/21/20- 07/21/20). Sent to [facility] for Open MRI.

## 2020-06-21 NOTE — Telephone Encounter (Signed)
Attempted to call the patient to review the lab results. There was no answer and VM didn't pick up. We can try again later time.   ** If patient calls back please advise that Dr Rexene Alberts reviewed the lab results and overall work looked good and will allow for the patient to move forward with completing the MRI of brain with contrast as discussed.

## 2020-06-21 NOTE — Progress Notes (Signed)
Kidney function is okay, please update patient that we can proceed with a brain MRI as discussed.

## 2020-06-23 NOTE — Telephone Encounter (Signed)
I called pt and was connected with his vm. VM left ( ok per dpr) advising of results.   Pt advised to CB if he had any questions/concerns.

## 2020-06-28 DIAGNOSIS — R251 Tremor, unspecified: Secondary | ICD-10-CM | POA: Diagnosis not present

## 2020-06-28 DIAGNOSIS — R519 Headache, unspecified: Secondary | ICD-10-CM | POA: Diagnosis not present

## 2020-06-28 DIAGNOSIS — M47812 Spondylosis without myelopathy or radiculopathy, cervical region: Secondary | ICD-10-CM | POA: Diagnosis not present

## 2020-06-30 ENCOUNTER — Telehealth: Payer: Self-pay | Admitting: Neurology

## 2020-06-30 NOTE — Telephone Encounter (Signed)
called the pt, there was no answer. Unable to LVM.  Will try again  ** If patient returns call please advise that Dr Rexene Alberts reviewed the MRI of brain and it did not show any concerns that would be cause of tremors. She did note that the imaging was able to see there was some overall changes in the upper neck/spine. If this is bothersome, the pt may benefit seeing his neurosurgeon for this.  Overall MRI of the brain showed nothing concerning.

## 2020-06-30 NOTE — Telephone Encounter (Signed)
I received patient's brain MRI report.  He had a brain MRI with and without contrast through [facility] on 06/28/2020: Impression: Mild senescent changes in the brain.  No obvious cause tremor.  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  Hypertrophic changes at C1-C2 articulation anteriorly.  These may relate to the patient's posterior head pain, affecting upper cervical nerves.  Please call patient and advise him that his recent brain MRI did not show any obvious cause for his tremors.  I would recommend that he follow-up with his spine specialist and discuss additional treatment of his degenerative neck disease since they did see changes in the upper spine.  He may benefit from seeing a spine surgeon again.  He had seen Dr. Maryjean Ka in the past as well as Dr. Sherwood Gambler.

## 2020-06-30 NOTE — Telephone Encounter (Signed)
Received the report from [place] with the results of the MRI brain. I will have Dr Rexene Alberts review and then contact the patient with the results.

## 2020-07-06 DIAGNOSIS — H903 Sensorineural hearing loss, bilateral: Secondary | ICD-10-CM | POA: Diagnosis not present

## 2020-07-06 DIAGNOSIS — R0982 Postnasal drip: Secondary | ICD-10-CM | POA: Diagnosis not present

## 2020-07-06 NOTE — Telephone Encounter (Signed)
I called pt and we han an extended conversation. Pt is ok with speaking with neurosurgen. He has seen Dr. Jenne Campus with [facility].   I have faxed printed report to him.

## 2020-07-06 NOTE — Telephone Encounter (Signed)
Pt called, about MRI results. Informed him of previous telephone note. Pt has questions and would like a call from the nurse. Pt ask to call before 12p.

## 2020-07-14 DIAGNOSIS — E1142 Type 2 diabetes mellitus with diabetic polyneuropathy: Secondary | ICD-10-CM | POA: Diagnosis not present

## 2020-07-14 DIAGNOSIS — I10 Essential (primary) hypertension: Secondary | ICD-10-CM | POA: Diagnosis not present

## 2020-07-22 ENCOUNTER — Ambulatory Visit: Payer: Medicare HMO | Admitting: Podiatry

## 2020-08-02 ENCOUNTER — Ambulatory Visit (INDEPENDENT_AMBULATORY_CARE_PROVIDER_SITE_OTHER): Payer: Medicare HMO | Admitting: Urology

## 2020-08-02 ENCOUNTER — Encounter: Payer: Self-pay | Admitting: Urology

## 2020-08-02 ENCOUNTER — Other Ambulatory Visit: Payer: Self-pay

## 2020-08-02 ENCOUNTER — Ambulatory Visit (HOSPITAL_COMMUNITY)
Admission: RE | Admit: 2020-08-02 | Discharge: 2020-08-02 | Disposition: A | Discharge status: Home / Self Care | Payer: Medicare HMO | Source: Ambulatory Visit | Attending: Internal Medicine | Admitting: Internal Medicine

## 2020-08-02 VITALS — BP 117/55 | HR 76 | Temp 98.2°F | Wt 187.2 lb

## 2020-08-02 DIAGNOSIS — N411 Chronic prostatitis: Secondary | ICD-10-CM | POA: Diagnosis not present

## 2020-08-02 DIAGNOSIS — R000 Tachycardia, unspecified: Secondary | ICD-10-CM | POA: Diagnosis not present

## 2020-08-02 DIAGNOSIS — R300 Dysuria: Secondary | ICD-10-CM | POA: Diagnosis not present

## 2020-08-02 LAB — URINALYSIS, ROUTINE W REFLEX MICROSCOPIC
Bilirubin, UA: NEGATIVE
Ketones, UA: NEGATIVE
Leukocytes,UA: NEGATIVE
Nitrite, UA: NEGATIVE
Protein,UA: NEGATIVE
RBC, UA: NEGATIVE
Specific Gravity, UA: 1.015 (ref 1.005–1.030)
Urobilinogen, Ur: 1 mg/dL (ref 0.2–1.0)
pH, UA: 7 (ref 5.0–7.5)

## 2020-08-02 NOTE — Progress Notes (Signed)
08/02/2020 2:43 PM   [name] September 01, 1940 [id number]  Referring provider: Rosita Fire, MD [place],  [place] [address]  Followup chronic prostatitis   HPI: [name] is a 80yo here for followup for dysuria and chronic prostatitis. His LUTS resolved after doxycycline. Dysuria resolved. IPSS 4 QOL 0. No other complaints today   PMH: Past Medical History:  Diagnosis Date   Diabetes mellitus    A1c of 7 -12/2009   DJD (degenerative joint disease)    left knee,cervical spine,lumbosacral spine   DVT (deep venous thrombosis) ([place]) 12/2009   left lower extremity  following left TKA   Hyperlipidemia    Hypertension    Obesity     Surgical History: Past Surgical History:  Procedure Laterality Date   CERVICAL DISCECTOMY     and fusion   COLONOSCOPY  08/15/2011   Procedure: COLONOSCOPY;  Surgeon: Daneil Dolin, MD;  [facility];  Service: Endoscopy;  Laterality: N/A;  11:15 AM   COLONOSCOPY W/ POLYPECTOMY  2003   TOTAL KNEE ARTHROPLASTY  2011   TOTAL KNEE ARTHROPLASTY  03/2008   TOTAL SHOULDER ARTHROPLASTY  05/2009   right     Home Medications:  Allergies as of 08/02/2020   No Known Allergies      Medication List        Accurate as of August 02, 2020  2:43 PM. If you have any questions, ask your nurse or doctor.          baclofen 10 MG tablet Commonly known as: LIORESAL Take 1 tablet by mouth daily.   doxycycline 100 MG capsule Commonly known as: VIBRAMYCIN Take 1 capsule (100 mg total) by mouth 2 (two) times daily.   gabapentin 800 MG tablet Commonly known as: NEURONTIN Take 800 mg by mouth 3 (three) times daily.   glipiZIDE 5 MG tablet Commonly known as: GLUCOTROL Take 5 mg by mouth 2 (two) times daily before a meal. Pt takes one tablet in the AM   HYDROcodone-acetaminophen 10-325 MG tablet Commonly known as: NORCO Take 1 tablet by mouth 4 (four) times daily as needed.   ipratropium-albuterol 0.5-2.5 (3)  MG/3ML Soln Commonly known as: DUONEB Take 0.5 mLs by nebulization 4 (four) times daily as needed.   losartan-hydrochlorothiazide 50-12.5 MG tablet Commonly known as: HYZAAR Take 1 tablet by mouth daily.   meclizine 25 MG tablet Commonly known as: ANTIVERT Take 25 mg by mouth 2 (two) times daily as needed for dizziness.   metFORMIN 500 MG tablet Commonly known as: GLUCOPHAGE Take 1 tablet (500 mg total) by mouth 2 (two) times daily with a meal. Takes one tablet in the AM   metoprolol succinate 50 MG 24 hr tablet Commonly known as: TOPROL-XL Take 50 mg by mouth daily.   multivitamin with minerals tablet Take 1 tablet by mouth daily.   NON FORMULARY [facility]  Peripheral Neuropathy Cream- Bupivacaine 1%, Doxepin 3%, Gabapentin 6%, Pentoxifylline 3%, Topiramate 1% Apply 1-2 grams to affected area 3-4 times daily Qty. 120 gm 3 refills   predniSONE 50 MG tablet Commonly known as: DELTASONE Take 1 tablet (50 mg total) by mouth daily with breakfast.   simvastatin 40 MG tablet Commonly known as: ZOCOR Take 40 mg by mouth at bedtime.   tadalafil 5 MG tablet Commonly known as: CIALIS Take 1 tablet (5 mg total) by mouth daily as needed for erectile dysfunction.   Vitamin D3 50 MCG (2000 UT) Tabs Take 1 tablet by  mouth daily.        Allergies: No Known Allergies  Family History: No family history on file.  Social History:  reports that he has never smoked. He has never used smokeless tobacco. He reports that he does not drink alcohol and does not use drugs.  ROS: All other review of systems were reviewed and are negative except what is noted above in HPI  Physical Exam: BP (!) 117/55   Pulse 76   Temp 98.2 F (36.8 C)   Wt 187 lb 3.2 oz (84.9 kg)   BMI 26.11 kg/m   Constitutional:  Alert and oriented, No acute distress. HEENT:  AT, moist mucus membranes.  Trachea midline, no masses. Cardiovascular: No clubbing, cyanosis, or edema. Respiratory:  Normal respiratory effort, no increased work of breathing. GI: Abdomen is soft, nontender, nondistended, no abdominal masses GU: No CVA tenderness.  Lymph: No cervical or inguinal lymphadenopathy. Skin: No rashes, bruises or suspicious lesions. Neurologic: Grossly intact, no focal deficits, moving all 4 extremities. Psychiatric: Normal mood and affect.  Laboratory Data: Lab Results  Component Value Date   WBC 13.7 (H) 01/04/2020   HGB 14.6 01/04/2020   HCT 42.2 01/04/2020   MCV 87.9 01/04/2020   PLT 307 01/04/2020    Lab Results  Component Value Date   CREATININE 0.99 06/20/2020    No results found for: PSA  No results found for: TESTOSTERONE  Lab Results  Component Value Date   HGBA1C 8.0 (H) 01/02/2020    Urinalysis    Component Value Date/Time   COLORURINE YELLOW 12/23/2009 0151   APPEARANCEUR Clear 05/04/2020 1115   LABSPEC 1.020 12/23/2009 0151   PHURINE 7.5 12/23/2009 0151   GLUCOSEU Negative 05/04/2020 1115   HGBUR NEGATIVE 12/23/2009 0151   BILIRUBINUR Negative 05/04/2020 1115   KETONESUR 40 (A) 12/23/2009 0151   PROTEINUR Negative 05/04/2020 1115   PROTEINUR NEGATIVE 12/23/2009 0151   UROBILINOGEN 1.0 12/23/2009 0151   NITRITE Negative 05/04/2020 1115   NITRITE NEGATIVE 12/23/2009 0151   LEUKOCYTESUR Negative 05/04/2020 1115    Lab Results  Component Value Date   LABMICR Comment 05/04/2020   WBCUA 11-30 (A) 02/18/2020   LABEPIT 0-10 02/18/2020   BACTERIA Many (A) 02/18/2020    Pertinent Imaging:  No results found for this or any previous visit.  No results found for this or any previous visit.  No results found for this or any previous visit.  No results found for this or any previous visit.  No results found for this or any previous visit.  No results found for this or any previous visit.  No results found for this or any previous visit.  No results found for this or any previous visit.   Assessment & Plan:    1.  Dysuria -resolved - BLADDER SCAN AMB NON-IMAGING - Urinalysis, Routine w reflex microscopic  2. 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 RTC prn   No follow-ups on file.  Nicolette Bang, MD  [facility] [place]

## 2020-08-02 NOTE — Patient Instructions (Signed)
Prostatitis  Prostatitis is swelling of the prostate gland, also called the prostate. This gland is about 1.5 inches wide and 1 inch high, and it helps to make a fluid called semen. The prostate is below a man's bladder, in front of the butt (rectum). There are different types of prostatitis. What are the causes? One type of prostatitis is caused by an infection from germs (bacteria). Another type is not caused by germs. It may be caused by: Things having to do with the nervous system. This system includes thebrain, spinal cord, and nerves. An autoimmune response. This happens when the body's disease-fighting system attacks healthy tissue in the body by mistake. Psychological factors. These have to do with how the mind works. The causes of other types of prostatitis are normally not known. What are the signs or symptoms? Symptoms of this condition depend on the type of prostatitis you have. If your condition is caused by germs: You may feel pain or burning when you pee (urinate). You may pee often and all of a sudden. You may have problems starting to pee. You may have trouble emptying your bladder when you pee. You may have fever or chills. You may feel pain in your muscles, joints, low back, or lower belly. If you have other types of prostatitis: You may pee often or all of a sudden. You may have trouble starting to pee. You may have a weak flow when you pee. You may leak pee after using the bathroom. You may have other problems, such as: Abnormal fluid coming from the penis. Pain in the testicles or penis. Pain between the butt and the testicles. Pain when fluid comes out of the penis during sex. How is this treated? Treatment for this condition depends on the type of prostatitis. Treatment may include: Medicines. These may treat pain or swelling, or they may help relax muscles. Exercises to help you move better or get stronger (physical therapy). Heat therapy. Techniques to help  you control some of the ways that your body works. Exercises to help you relax. Antibiotic medicine, if your condition is caused by germs. Warm water baths (sitz baths) to relax muscles. Follow these instructions at home: Medicines Take over-the-counter and prescription medicines only as told by your doctor. If you were prescribed an antibiotic medicine, take it as told by your doctor. Do not stop using the antibiotic even if you start to feel better. Managing pain and swelling  Take sitz baths as told by your doctor. For a sitz bath, sit in warm water that is deep enough to cover your hips and butt. If told, put heat on the painful area. Do this as often as told by your doctor. Use the heat source that your doctor recommends, such as a moist heat pack or a heating pad. Place a towel between your skin and the heat source. Leave the heat on for 20-30 minutes. Take off the heat if your skin turns bright red. This is very important if you are unable to feel pain, heat, or cold. You may have a greater risk of getting burned.  General instructions Do exercises as told by your doctor, if your doctor prescribed them. Keep all follow-up visits as told by your doctor. This is important. Where to find more information [employer] of Diabetes and Digestive and Kidney Diseases: http://www.bass.com/ Contact a doctor if: Your symptoms get worse. You have a fever. Get help right away if: You have chills. You feel light-headed. You feel like you  may faint. You cannot pee. You have blood or clumps of blood (blood clots) in your pee. Summary Prostatitis is swelling of the prostate gland. There are different types of prostatitis. Treatment depends on the type that you have. Take over-the-counter and prescription medicines only as told by your doctor. Get help right away of you have chills, feel light-headed, or feel like you may faint. Also get help right away if you cannot pee or you have  blood or clumps of blood in your pee. This information is not intended to replace advice given to you by your health care provider. Make sure you discuss any questions you have with your healthcare provider. Document Revised: 02/27/2019 Document Reviewed: 02/27/2019 Elsevier Patient Education  2022 [employer].

## 2020-08-02 NOTE — Progress Notes (Signed)
Urological Symptom Review  Patient is experiencing the following symptoms: Burning/pain with urination Get up at night to urinate   Review of Systems  Gastrointestinal (upper)  : Negative for upper GI symptoms  Gastrointestinal (lower) : Diarrhea  Constitutional : Negative for symptoms  Skin: Negative for skin symptoms  Eyes: Negative for eye symptoms  Ear/Nose/Throat : Negative for Ear/Nose/Throat symptoms  Hematologic/Lymphatic: Negative for Hematologic/Lymphatic symptoms  Cardiovascular : Negative for cardiovascular symptoms  Respiratory : Negative for respiratory symptoms  Endocrine: Negative for endocrine symptoms  Musculoskeletal: Negative for musculoskeletal symptoms  Neurological: Negative for neurological symptoms  Psychologic: Negative for psychiatric symptoms

## 2020-08-09 DIAGNOSIS — H524 Presbyopia: Secondary | ICD-10-CM | POA: Diagnosis not present

## 2020-08-09 DIAGNOSIS — M47812 Spondylosis without myelopathy or radiculopathy, cervical region: Secondary | ICD-10-CM | POA: Diagnosis not present

## 2020-08-13 DIAGNOSIS — I10 Essential (primary) hypertension: Secondary | ICD-10-CM | POA: Diagnosis not present

## 2020-08-13 DIAGNOSIS — E1142 Type 2 diabetes mellitus with diabetic polyneuropathy: Secondary | ICD-10-CM | POA: Diagnosis not present

## 2020-08-22 DIAGNOSIS — Z5181 Encounter for therapeutic drug level monitoring: Secondary | ICD-10-CM | POA: Diagnosis not present

## 2020-08-22 DIAGNOSIS — Z79899 Other long term (current) drug therapy: Secondary | ICD-10-CM | POA: Diagnosis not present

## 2020-08-30 DIAGNOSIS — E1142 Type 2 diabetes mellitus with diabetic polyneuropathy: Secondary | ICD-10-CM | POA: Diagnosis not present

## 2020-08-30 DIAGNOSIS — R197 Diarrhea, unspecified: Secondary | ICD-10-CM | POA: Diagnosis not present

## 2020-08-30 DIAGNOSIS — E1141 Type 2 diabetes mellitus with diabetic mononeuropathy: Secondary | ICD-10-CM | POA: Diagnosis not present

## 2020-08-30 DIAGNOSIS — I10 Essential (primary) hypertension: Secondary | ICD-10-CM | POA: Diagnosis not present

## 2020-09-02 DIAGNOSIS — I10 Essential (primary) hypertension: Secondary | ICD-10-CM | POA: Diagnosis not present

## 2020-09-02 DIAGNOSIS — E1141 Type 2 diabetes mellitus with diabetic mononeuropathy: Secondary | ICD-10-CM | POA: Diagnosis not present

## 2020-10-01 DIAGNOSIS — E1142 Type 2 diabetes mellitus with diabetic polyneuropathy: Secondary | ICD-10-CM | POA: Diagnosis not present

## 2020-10-01 DIAGNOSIS — I10 Essential (primary) hypertension: Secondary | ICD-10-CM | POA: Diagnosis not present

## 2020-11-01 DIAGNOSIS — I10 Essential (primary) hypertension: Secondary | ICD-10-CM | POA: Diagnosis not present

## 2020-11-01 DIAGNOSIS — E1142 Type 2 diabetes mellitus with diabetic polyneuropathy: Secondary | ICD-10-CM | POA: Diagnosis not present

## 2020-11-08 DIAGNOSIS — Z5181 Encounter for therapeutic drug level monitoring: Secondary | ICD-10-CM | POA: Diagnosis not present

## 2020-11-08 DIAGNOSIS — G894 Chronic pain syndrome: Secondary | ICD-10-CM | POA: Diagnosis not present

## 2020-11-08 DIAGNOSIS — Z79891 Long term (current) use of opiate analgesic: Secondary | ICD-10-CM | POA: Diagnosis not present

## 2020-11-29 DIAGNOSIS — I10 Essential (primary) hypertension: Secondary | ICD-10-CM | POA: Diagnosis not present

## 2020-12-01 DIAGNOSIS — I10 Essential (primary) hypertension: Secondary | ICD-10-CM | POA: Diagnosis not present

## 2020-12-01 DIAGNOSIS — E1142 Type 2 diabetes mellitus with diabetic polyneuropathy: Secondary | ICD-10-CM | POA: Diagnosis not present

## 2020-12-19 DIAGNOSIS — E1165 Type 2 diabetes mellitus with hyperglycemia: Secondary | ICD-10-CM | POA: Diagnosis not present

## 2020-12-19 DIAGNOSIS — I10 Essential (primary) hypertension: Secondary | ICD-10-CM | POA: Diagnosis not present

## 2020-12-19 DIAGNOSIS — E1142 Type 2 diabetes mellitus with diabetic polyneuropathy: Secondary | ICD-10-CM | POA: Diagnosis not present

## 2020-12-19 DIAGNOSIS — J410 Simple chronic bronchitis: Secondary | ICD-10-CM | POA: Diagnosis not present

## 2020-12-19 DIAGNOSIS — M130 Polyarthritis, unspecified: Secondary | ICD-10-CM | POA: Diagnosis not present

## 2020-12-19 DIAGNOSIS — Z23 Encounter for immunization: Secondary | ICD-10-CM | POA: Diagnosis not present

## 2020-12-26 DIAGNOSIS — D485 Neoplasm of uncertain behavior of skin: Secondary | ICD-10-CM | POA: Diagnosis not present

## 2020-12-26 DIAGNOSIS — L570 Actinic keratosis: Secondary | ICD-10-CM | POA: Diagnosis not present

## 2020-12-26 DIAGNOSIS — L819 Disorder of pigmentation, unspecified: Secondary | ICD-10-CM | POA: Diagnosis not present

## 2020-12-26 DIAGNOSIS — C44629 Squamous cell carcinoma of skin of left upper limb, including shoulder: Secondary | ICD-10-CM | POA: Diagnosis not present

## 2020-12-26 DIAGNOSIS — D1801 Hemangioma of skin and subcutaneous tissue: Secondary | ICD-10-CM | POA: Diagnosis not present

## 2020-12-26 DIAGNOSIS — L814 Other melanin hyperpigmentation: Secondary | ICD-10-CM | POA: Diagnosis not present

## 2021-01-05 DIAGNOSIS — L570 Actinic keratosis: Secondary | ICD-10-CM | POA: Diagnosis not present

## 2021-01-05 DIAGNOSIS — L728 Other follicular cysts of the skin and subcutaneous tissue: Secondary | ICD-10-CM | POA: Diagnosis not present

## 2021-01-18 DIAGNOSIS — E1142 Type 2 diabetes mellitus with diabetic polyneuropathy: Secondary | ICD-10-CM | POA: Diagnosis not present

## 2021-01-18 DIAGNOSIS — I10 Essential (primary) hypertension: Secondary | ICD-10-CM | POA: Diagnosis not present

## 2021-01-20 ENCOUNTER — Telehealth: Payer: Self-pay

## 2021-01-20 NOTE — Telephone Encounter (Signed)
Patient called and advised that he is having frequent urination especially at night. He wanted to see if something could be called in to his pharmacy. I scheduled patient an appt but next available is January 18th.

## 2021-01-20 NOTE — Telephone Encounter (Signed)
Spoke with patient and appt given for Monday at 11am to see PA for PVR.

## 2021-01-20 NOTE — Telephone Encounter (Signed)
See below. Please advise.  

## 2021-01-23 ENCOUNTER — Ambulatory Visit (INDEPENDENT_AMBULATORY_CARE_PROVIDER_SITE_OTHER): Payer: Medicare HMO | Admitting: Urology

## 2021-01-23 ENCOUNTER — Other Ambulatory Visit: Payer: Self-pay

## 2021-01-23 VITALS — BP 167/96 | HR 72 | Wt 187.0 lb

## 2021-01-23 DIAGNOSIS — R351 Nocturia: Secondary | ICD-10-CM

## 2021-01-23 DIAGNOSIS — L702 Acne varioliformis: Secondary | ICD-10-CM | POA: Diagnosis not present

## 2021-01-23 DIAGNOSIS — C44629 Squamous cell carcinoma of skin of left upper limb, including shoulder: Secondary | ICD-10-CM | POA: Diagnosis not present

## 2021-01-23 LAB — MICROSCOPIC EXAMINATION
Bacteria, UA: NONE SEEN
Epithelial Cells (non renal): NONE SEEN /hpf (ref 0–10)
Renal Epithel, UA: NONE SEEN /hpf
WBC, UA: NONE SEEN /hpf (ref 0–5)

## 2021-01-23 LAB — URINALYSIS, ROUTINE W REFLEX MICROSCOPIC
Bilirubin, UA: NEGATIVE
Ketones, UA: NEGATIVE
Leukocytes,UA: NEGATIVE
Nitrite, UA: NEGATIVE
Specific Gravity, UA: 1.02 (ref 1.005–1.030)
Urobilinogen, Ur: 0.2 mg/dL (ref 0.2–1.0)
pH, UA: 7 (ref 5.0–7.5)

## 2021-01-23 LAB — BLADDER SCAN AMB NON-IMAGING: Scan Result: 103

## 2021-01-23 NOTE — Progress Notes (Signed)
Discussed with PA Summerlin.

## 2021-01-23 NOTE — Progress Notes (Signed)
scpost void residual =103  Urological Symptom Review  Patient is experiencing the following symptoms: Negative   Review of Systems  Gastrointestinal (upper)  : Negative for upper GI symptoms  Gastrointestinal (lower) : Negative for lower GI symptoms  Constitutional : Negative for symptoms  Skin: Negative for skin symptoms  Eyes: Negative for eye symptoms  Ear/Nose/Throat : Negative for Ear/Nose/Throat symptoms  Hematologic/Lymphatic: Negative for Hematologic/Lymphatic symptoms  Cardiovascular : Negative for cardiovascular symptoms  Respiratory : Negative for respiratory symptoms  Endocrine: Negative for endocrine symptoms  Musculoskeletal: Negative for musculoskeletal symptoms  Neurological: Negative for neurological symptoms  Psychologic: Negative for psychiatric symptoms

## 2021-01-23 NOTE — Progress Notes (Signed)
01/23/2021 10:54 AM   [name] [birth year] [id number]  Referring provider: Rosita Fire, MD [address] [place],  [place] [address]  Chief Complaint  Patient presents with   Urinary Frequency    HPI: 08/02/20 [name] is a 80yo here for followup for dysuria and chronic prostatitis. His LUTS resolved after doxycycline. Dysuria resolved. IPSS 4 QOL 0. No other complaints today  05/04/20 [name] is a 80yo here for followup for BPH. He has new right groin pain and worsening LUTS since last visit. He has ben having dysuria for the past 2-3 weeks. He has a weaker stream, urinary urgency, urinary frequency, and straining to urinate. He has right groin pain that has been present for 2-3 weeks. It is no related to activity. He has intermittent tenderness of his right testis. No trauma or injury  Urine cx negative  02/18/20 Urine cx: Pan-sensitive Staphylococcus epidermidis  01/23/21  [name] is a 80YO male with h/o BPH and chronic prostatitis presents with c/o increased urinary frequency at night for approximately 6-7 weeks. He now goes 2-3 times/per night. The pt feels that he empties well and his stream is normal for him. No dysuria, burning, gross hematuria, abdominal pain, back pain, fever, chills, nausea. He continues to drink 7-8 beers/day and states his glucose is usually in the 170s at night. Last A1c in Nicholas was 8.0 on 01/02/20 IPSS=4 PVR-167ml   PMH: Past Medical History:  Diagnosis Date   Diabetes mellitus    A1c of 7 -12/2009   DJD (degenerative joint disease)    left knee,cervical spine,lumbosacral spine   DVT (deep venous thrombosis) ([place]) 12/2009   left lower extremity  following left TKA   Hyperlipidemia    Hypertension    Obesity     Surgical History: Past Surgical History:  Procedure Laterality Date   CERVICAL DISCECTOMY     and fusion   COLONOSCOPY  08/15/2011   Procedure: COLONOSCOPY;  Surgeon: Daneil Dolin, MD;  [facility];  Service: Endoscopy;  Laterality: N/A;  11:15 AM   COLONOSCOPY W/ POLYPECTOMY  2003   TOTAL KNEE ARTHROPLASTY  2011   TOTAL KNEE ARTHROPLASTY  03/2008   TOTAL SHOULDER ARTHROPLASTY  05/2009   right     Home Medications:  Allergies as of 01/23/2021   No Known Allergies      Medication List        Accurate as of January 23, 2021 10:54 AM. If you have any questions, ask your nurse or doctor.          baclofen 10 MG tablet Commonly known as: LIORESAL Take 1 tablet by mouth daily.   doxycycline 100 MG capsule Commonly known as: VIBRAMYCIN Take 1 capsule (100 mg total) by mouth 2 (two) times daily.   gabapentin 800 MG tablet Commonly known as: NEURONTIN Take 800 mg by mouth 3 (three) times daily.   glipiZIDE 5 MG tablet Commonly known as: GLUCOTROL Take 5 mg by mouth 2 (two) times daily before a meal. Pt takes one tablet in the AM   HYDROcodone-acetaminophen 10-325 MG tablet Commonly known as: NORCO Take 1 tablet by mouth 4 (four) times daily as needed.   ipratropium-albuterol 0.5-2.5 (3) MG/3ML Soln Commonly known as: DUONEB Take 0.5 mLs by nebulization 4 (four) times daily as needed.   losartan-hydrochlorothiazide 50-12.5 MG tablet Commonly known as: HYZAAR Take 1 tablet by mouth daily.   meclizine 25 MG tablet Commonly known as: ANTIVERT Take 25  mg by mouth 2 (two) times daily as needed for dizziness.   metFORMIN 500 MG tablet Commonly known as: GLUCOPHAGE Take 1 tablet (500 mg total) by mouth 2 (two) times daily with a meal. Takes one tablet in the AM   metoprolol succinate 50 MG 24 hr tablet Commonly known as: TOPROL-XL Take 50 mg by mouth daily.   multivitamin with minerals tablet Take 1 tablet by mouth daily.   NON FORMULARY [facility]  Peripheral Neuropathy Cream- Bupivacaine 1%, Doxepin 3%, Gabapentin 6%, Pentoxifylline 3%, Topiramate 1% Apply 1-2 grams to affected area 3-4 times daily Qty. 120 gm 3 refills   predniSONE 50 MG  tablet Commonly known as: DELTASONE Take 1 tablet (50 mg total) by mouth daily with breakfast.   simvastatin 40 MG tablet Commonly known as: ZOCOR Take 40 mg by mouth at bedtime.   tadalafil 5 MG tablet Commonly known as: CIALIS Take 1 tablet (5 mg total) by mouth daily as needed for erectile dysfunction.   Vitamin D3 50 MCG (2000 UT) Tabs Take 1 tablet by mouth daily.        Allergies: No Known Allergies  Family History: No family history on file.  Social History: Denies tobacco use. +ETOH daily  ROS: All other review of systems were reviewed and are negative except what is noted above in HPI  Physical Exam: BP (!) 167/96    Pulse 72    Wt 187 lb (84.8 kg)    BMI 26.08 kg/m   Constitutional:  Alert and oriented, No acute distress. HEENT: [name] AT, Trachea midline, no masses. Cardiovascular: No clubbing, cyanosis, or edema. Respiratory: Normal respiratory effort, no increased work of breathing. Skin: No rashes, bruises or suspicious lesions. Neurologic: Grossly intact, no focal deficits, moving all 4 extremities. Psychiatric: Normal mood and affect.  Laboratory Data: Lab Results  Component Value Date   WBC 13.7 (H) 01/04/2020   HGB 14.6 01/04/2020   HCT 42.2 01/04/2020   MCV 87.9 01/04/2020   PLT 307 01/04/2020    Lab Results  Component Value Date   CREATININE 0.99 06/20/2020    No results found for: PSA  No results found for: TESTOSTERONE  Lab Results  Component Value Date   HGBA1C 8.0 (H) 01/02/2020    Urinalysis    Component Value Date/Time   COLORURINE YELLOW 12/23/2009 0151   APPEARANCEUR Clear 08/02/2020 1451   LABSPEC 1.020 12/23/2009 0151   PHURINE 7.5 12/23/2009 0151   GLUCOSEU 1+ (A) 08/02/2020 1451   HGBUR NEGATIVE 12/23/2009 0151   BILIRUBINUR Negative 08/02/2020 1451   KETONESUR 40 (A) 12/23/2009 0151   PROTEINUR Negative 08/02/2020 1451   PROTEINUR NEGATIVE 12/23/2009 0151   UROBILINOGEN 1.0 12/23/2009 0151   NITRITE Negative  08/02/2020 1451   NITRITE NEGATIVE 12/23/2009 0151   LEUKOCYTESUR Negative 08/02/2020 1451    Lab Results  Component Value Date   LABMICR Comment 08/02/2020   WBCUA 11-30 (A) 02/18/2020   LABEPIT 0-10 02/18/2020   BACTERIA [place] (A) 02/18/2020    Pertinent Imaging: No imaging today   Jeter was seen today for urinary frequency.  Diagnoses and all orders for this visit:  Nocturia -     BLADDER SCAN AMB NON-IMAGING -     Urinalysis, Routine w reflex microscopic   Urine clear today. No indication for antibx or culture Pt advised to decrease amount of ETOH consumed during the day and counseled on sx  Urinary Frequency Recommended pt decrease ETOH intake and increase other fluids. Also advised  that increased glucose levels can affect frequency of urination. If pt sxs progress, may consider trying Flomax or BPH medication. No h/o Rx for BPH  in the past. Pt reassured that urine clear of infection today and that he is emptying adequately.   FU for already scheduled OV next month  [place]lin, Berneice Heinrich, PA-C  [facility] [place]

## 2021-01-24 ENCOUNTER — Telehealth: Payer: Self-pay

## 2021-01-24 NOTE — Telephone Encounter (Signed)
Pt was called back and a message left that his urine came back clear and there is no need for change of medication at this time, please keep upcoming appointment.

## 2021-01-24 NOTE — Telephone Encounter (Signed)
Pt called regarding his urine culture results.

## 2021-02-16 DIAGNOSIS — L570 Actinic keratosis: Secondary | ICD-10-CM | POA: Diagnosis not present

## 2021-02-16 DIAGNOSIS — L578 Other skin changes due to chronic exposure to nonionizing radiation: Secondary | ICD-10-CM | POA: Diagnosis not present

## 2021-02-16 DIAGNOSIS — L989 Disorder of the skin and subcutaneous tissue, unspecified: Secondary | ICD-10-CM | POA: Diagnosis not present

## 2021-02-16 DIAGNOSIS — C44629 Squamous cell carcinoma of skin of left upper limb, including shoulder: Secondary | ICD-10-CM | POA: Diagnosis not present

## 2021-02-18 DIAGNOSIS — E1142 Type 2 diabetes mellitus with diabetic polyneuropathy: Secondary | ICD-10-CM | POA: Diagnosis not present

## 2021-02-18 DIAGNOSIS — M5030 Other cervical disc degeneration, unspecified cervical region: Secondary | ICD-10-CM | POA: Diagnosis not present

## 2021-02-22 ENCOUNTER — Ambulatory Visit: Payer: Medicare HMO | Admitting: Urology

## 2021-02-22 DIAGNOSIS — N390 Urinary tract infection, site not specified: Secondary | ICD-10-CM

## 2021-02-22 DIAGNOSIS — N5201 Erectile dysfunction due to arterial insufficiency: Secondary | ICD-10-CM

## 2021-03-01 DIAGNOSIS — C44529 Squamous cell carcinoma of skin of other part of trunk: Secondary | ICD-10-CM | POA: Diagnosis not present

## 2021-03-01 DIAGNOSIS — L923 Foreign body granuloma of the skin and subcutaneous tissue: Secondary | ICD-10-CM | POA: Diagnosis not present

## 2021-03-01 DIAGNOSIS — G894 Chronic pain syndrome: Secondary | ICD-10-CM | POA: Diagnosis not present

## 2021-03-01 DIAGNOSIS — D485 Neoplasm of uncertain behavior of skin: Secondary | ICD-10-CM | POA: Diagnosis not present

## 2021-03-13 DIAGNOSIS — Z08 Encounter for follow-up examination after completed treatment for malignant neoplasm: Secondary | ICD-10-CM | POA: Diagnosis not present

## 2021-03-13 DIAGNOSIS — L570 Actinic keratosis: Secondary | ICD-10-CM | POA: Diagnosis not present

## 2021-03-13 DIAGNOSIS — Z85828 Personal history of other malignant neoplasm of skin: Secondary | ICD-10-CM | POA: Diagnosis not present

## 2021-03-13 DIAGNOSIS — X32XXXD Exposure to sunlight, subsequent encounter: Secondary | ICD-10-CM | POA: Diagnosis not present

## 2021-03-21 DIAGNOSIS — I10 Essential (primary) hypertension: Secondary | ICD-10-CM | POA: Diagnosis not present

## 2021-03-21 DIAGNOSIS — E1142 Type 2 diabetes mellitus with diabetic polyneuropathy: Secondary | ICD-10-CM | POA: Diagnosis not present

## 2021-04-18 ENCOUNTER — Encounter: Payer: Self-pay | Admitting: Urology

## 2021-04-18 ENCOUNTER — Ambulatory Visit (INDEPENDENT_AMBULATORY_CARE_PROVIDER_SITE_OTHER): Payer: Medicare HMO | Admitting: Urology

## 2021-04-18 ENCOUNTER — Other Ambulatory Visit: Payer: Self-pay

## 2021-04-18 VITALS — BP 136/81 | HR 72

## 2021-04-18 DIAGNOSIS — R351 Nocturia: Secondary | ICD-10-CM

## 2021-04-18 DIAGNOSIS — N401 Enlarged prostate with lower urinary tract symptoms: Secondary | ICD-10-CM

## 2021-04-18 DIAGNOSIS — N138 Other obstructive and reflux uropathy: Secondary | ICD-10-CM | POA: Diagnosis not present

## 2021-04-18 DIAGNOSIS — E1142 Type 2 diabetes mellitus with diabetic polyneuropathy: Secondary | ICD-10-CM | POA: Diagnosis not present

## 2021-04-18 DIAGNOSIS — M9979 Connective tissue and disc stenosis of intervertebral foramina of abdomen and other regions: Secondary | ICD-10-CM | POA: Diagnosis not present

## 2021-04-18 LAB — URINALYSIS, ROUTINE W REFLEX MICROSCOPIC
Bilirubin, UA: NEGATIVE
Glucose, UA: NEGATIVE
Ketones, UA: NEGATIVE
Leukocytes,UA: NEGATIVE
Nitrite, UA: NEGATIVE
Protein,UA: NEGATIVE
RBC, UA: NEGATIVE
Specific Gravity, UA: 1.015 (ref 1.005–1.030)
Urobilinogen, Ur: 1 mg/dL (ref 0.2–1.0)
pH, UA: 7 (ref 5.0–7.5)

## 2021-04-18 LAB — BLADDER SCAN AMB NON-IMAGING: Scan Result: 215

## 2021-04-18 MED ORDER — ALFUZOSIN HCL ER 10 MG PO TB24: 10.0000 mg | ORAL_TABLET | Freq: Every day | ORAL | 11 refills | Status: DC

## 2021-04-18 NOTE — Progress Notes (Signed)
? ?04/18/2021 ?12:03 PM  ? ?Asa Saunas ?Nov 02, 1940 ?[id number] ? ?Referring provider: Carrolyn Meiers, MD ?[address] ?[place],  [place] [address] ? ?Nocturia and urinary frequency ? ? ?HPI: ?Mr Partch is a 80yo here for followup for nocturia and frequency. He decreased his ETOH intake since last visit and this failed to improve his urinary frequency and nocturia. He has nocturia 4-5x. IPSS 23 QOL 5 on no therapy. Urine stream fair. He denies straining to urinate. No dysuria or hematuria. PVR 215cc.  ? ? ?PMH: ?Past Medical History:  ?Diagnosis Date  ? Diabetes mellitus   ? A1c of 7 -12/2009  ? DJD (degenerative joint disease)   ? left knee,cervical spine,lumbosacral spine  ? DVT (deep venous thrombosis) ([place]) 12/2009  ? left lower extremity  following left TKA  ? Hyperlipidemia   ? Hypertension   ? Obesity   ? ? ?Surgical History: ?Past Surgical History:  ?Procedure Laterality Date  ? CERVICAL DISCECTOMY    ? and fusion  ? COLONOSCOPY  08/15/2011  ? Procedure: COLONOSCOPY;  Surgeon: Daneil Dolin, MD;  [facility];  Service: Endoscopy;  Laterality: N/A;  11:15 AM  ? COLONOSCOPY W/ POLYPECTOMY  2003  ? TOTAL KNEE ARTHROPLASTY  2011  ? TOTAL KNEE ARTHROPLASTY  03/2008  ? TOTAL SHOULDER ARTHROPLASTY  05/2009  ? right   ? ? ?Home Medications:  ?Allergies as of 04/18/2021   ?No Known Allergies ?  ? ?  ?Medication List  ?  ? ?  ? Accurate as of April 18, 2021 12:03 PM. If you have any questions, ask your nurse or doctor.  ?  ?  ? ?  ? ?baclofen 10 MG tablet ?Commonly known as: LIORESAL ?Take 1 tablet by mouth daily. ?  ?citalopram 10 MG tablet ?Commonly known as: CELEXA ?Take by mouth. ?  ?doxycycline 100 MG capsule ?Commonly known as: VIBRAMYCIN ?Take 1 capsule (100 mg total) by mouth 2 (two) times daily. ?  ?gabapentin 800 MG tablet ?Commonly known as: NEURONTIN ?Take 800 mg by mouth 3 (three) times daily. ?  ?glipiZIDE 5 MG tablet ?Commonly known as: GLUCOTROL ?Take 5 mg by mouth 2 (two)  times daily before a meal. Pt takes one tablet in the AM ?  ?HYDROcodone-acetaminophen 10-325 MG tablet ?Commonly known as: NORCO ?Take 1 tablet by mouth 4 (four) times daily as needed. ?  ?ipratropium-albuterol 0.5-2.5 (3) MG/3ML Soln ?Commonly known as: DUONEB ?Take 0.5 mLs by nebulization 4 (four) times daily as needed. ?  ?losartan-hydrochlorothiazide 50-12.5 MG tablet ?Commonly known as: HYZAAR ?Take 1 tablet by mouth daily. ?  ?meclizine 25 MG tablet ?Commonly known as: ANTIVERT ?Take 25 mg by mouth 2 (two) times daily as needed for dizziness. ?  ?metFORMIN 500 MG tablet ?Commonly known as: GLUCOPHAGE ?Take 1 tablet (500 mg total) by mouth 2 (two) times daily with a meal. Takes one tablet in the AM ?  ?metoprolol succinate 50 MG 24 hr tablet ?Commonly known as: TOPROL-XL ?Take 50 mg by mouth daily. ?  ?montelukast 10 MG tablet ?Commonly known as: SINGULAIR ?  ?multivitamin with minerals tablet ?Take 1 tablet by mouth daily. ?  ?NON FORMULARY ?[place]  ?Peripheral Neuropathy Cream- ?Bupivacaine 1%, Doxepin 3%, Gabapentin 6%, Pentoxifylline 3%, Topiramate 1% ?Apply 1-2 grams to affected area 3-4 times daily ?Qty. 120 gm ?3 refills ?  ?omeprazole 20 MG capsule ?Commonly known as: PRILOSEC ?  ?predniSONE 50 MG tablet ?Commonly known as: DELTASONE ?Take 1 tablet (  50 mg total) by mouth daily with breakfast. ?  ?simvastatin 40 MG tablet ?Commonly known as: ZOCOR ?Take 40 mg by mouth at bedtime. ?  ?tadalafil 5 MG tablet ?Commonly known as: CIALIS ?Take 1 tablet (5 mg total) by mouth daily as needed for erectile dysfunction. ?  ?Tradjenta 5 MG Tabs tablet ?Generic drug: linagliptin ?  ?Vitamin D3 50 MCG (2000 UT) Tabs ?Take 1 tablet by mouth daily. ?  ? ?  ? ? ?Allergies: No Known Allergies ? ?Family History: ?No family history on file. ? ?Social History:  reports that he has never smoked. He has never used smokeless tobacco. He reports that he does not drink alcohol and does not use drugs. ? ?ROS: ?All  other review of systems were reviewed and are negative except what is noted above in HPI ? ?Physical Exam: ?BP 136/81   Pulse 72   ?Constitutional:  Alert and oriented, No acute distress. ?HEENT:  AT, moist mucus membranes.  Trachea midline, no masses. ?Cardiovascular: No clubbing, cyanosis, or edema. ?Respiratory: Normal respiratory effort, no increased work of breathing. ?GI: Abdomen is soft, nontender, nondistended, no abdominal masses ?GU: No CVA tenderness.  ?Lymph: No cervical or inguinal lymphadenopathy. ?Skin: No rashes, bruises or suspicious lesions. ?Neurologic: Grossly intact, no focal deficits, moving all 4 extremities. ?Psychiatric: Normal mood and affect. ? ?Laboratory Data: ?Lab Results  ?Component Value Date  ? WBC 13.7 (H) 01/04/2020  ? HGB 14.6 01/04/2020  ? HCT 42.2 01/04/2020  ? MCV 87.9 01/04/2020  ? PLT 307 01/04/2020  ? ? ?Lab Results  ?Component Value Date  ? CREATININE 0.99 06/20/2020  ? ? ?No results found for: PSA ? ?No results found for: TESTOSTERONE ? ?Lab Results  ?Component Value Date  ? HGBA1C 8.0 (H) 01/02/2020  ? ? ?Urinalysis ?   ?Component Value Date/Time  ? [place] YELLOW 12/23/2009 0151  ? APPEARANCEUR Clear 01/23/2021 1118  ? LABSPEC 1.020 12/23/2009 0151  ? PHURINE 7.5 12/23/2009 0151  ? GLUCOSEU Trace (A) 01/23/2021 1118  ? [place] NEGATIVE 12/23/2009 0151  ? BILIRUBINUR Negative 01/23/2021 1118  ? KETONESUR 40 (A) 12/23/2009 0151  ? PROTEINUR 1+ (A) 01/23/2021 1118  ? [place] NEGATIVE 12/23/2009 0151  ? UROBILINOGEN 1.0 12/23/2009 0151  ? NITRITE Negative 01/23/2021 1118  ? NITRITE NEGATIVE 12/23/2009 0151  ? LEUKOCYTESUR Negative 01/23/2021 1118  ? ? ?Lab Results  ?Component Value Date  ? LABMICR See below: 01/23/2021  ? [place] None seen 01/23/2021  ? LABEPIT None seen 01/23/2021  ? BACTERIA None seen 01/23/2021  ? ? ?Pertinent Imaging: ? ?No results found for this or any previous visit. ? ?No results found for this or any previous visit. ? ?No results found for this  or any previous visit. ? ?No results found for this or any previous visit. ? ?No results found for this or any previous visit. ? ?No results found for this or any previous visit. ? ?No results found for this or any previous visit. ? ?No results found for this or any previous visit. ? ? ?Assessment & Plan:   ? ?1. Nocturia ?-Start uroxatral '10mg'$  daily ?- BLADDER SCAN AMB NON-IMAGING ?- Urinalysis, Routine w reflex microscopic ? ?2. BPH with urinary obstruction ?-start uroxatral '10mg'$  daily ? ? ?No follow-ups on file. ? ?Nicolette Bang, MD ? ?[facility] [place] ?  ?

## 2021-04-18 NOTE — Progress Notes (Signed)
post void residual=215 ?

## 2021-04-18 NOTE — Patient Instructions (Signed)

## 2021-05-10 DIAGNOSIS — D485 Neoplasm of uncertain behavior of skin: Secondary | ICD-10-CM | POA: Diagnosis not present

## 2021-05-10 DIAGNOSIS — L570 Actinic keratosis: Secondary | ICD-10-CM | POA: Diagnosis not present

## 2021-05-10 DIAGNOSIS — C44529 Squamous cell carcinoma of skin of other part of trunk: Secondary | ICD-10-CM | POA: Diagnosis not present

## 2021-05-19 DIAGNOSIS — E1142 Type 2 diabetes mellitus with diabetic polyneuropathy: Secondary | ICD-10-CM | POA: Diagnosis not present

## 2021-05-19 DIAGNOSIS — I10 Essential (primary) hypertension: Secondary | ICD-10-CM | POA: Diagnosis not present

## 2021-05-30 ENCOUNTER — Ambulatory Visit: Payer: Medicare HMO | Admitting: Physician Assistant

## 2021-06-07 DIAGNOSIS — D485 Neoplasm of uncertain behavior of skin: Secondary | ICD-10-CM | POA: Diagnosis not present

## 2021-06-07 DIAGNOSIS — L905 Scar conditions and fibrosis of skin: Secondary | ICD-10-CM | POA: Diagnosis not present

## 2021-06-07 DIAGNOSIS — L570 Actinic keratosis: Secondary | ICD-10-CM | POA: Diagnosis not present

## 2021-06-07 DIAGNOSIS — C44629 Squamous cell carcinoma of skin of left upper limb, including shoulder: Secondary | ICD-10-CM | POA: Diagnosis not present

## 2021-06-19 DIAGNOSIS — Z23 Encounter for immunization: Secondary | ICD-10-CM | POA: Diagnosis not present

## 2021-06-19 DIAGNOSIS — Z0001 Encounter for general adult medical examination with abnormal findings: Secondary | ICD-10-CM | POA: Diagnosis not present

## 2021-06-19 DIAGNOSIS — E1142 Type 2 diabetes mellitus with diabetic polyneuropathy: Secondary | ICD-10-CM | POA: Diagnosis not present

## 2021-06-19 DIAGNOSIS — Z1331 Encounter for screening for depression: Secondary | ICD-10-CM | POA: Diagnosis not present

## 2021-06-19 DIAGNOSIS — J410 Simple chronic bronchitis: Secondary | ICD-10-CM | POA: Diagnosis not present

## 2021-06-19 DIAGNOSIS — Z1159 Encounter for screening for other viral diseases: Secondary | ICD-10-CM | POA: Diagnosis not present

## 2021-06-19 DIAGNOSIS — I10 Essential (primary) hypertension: Secondary | ICD-10-CM | POA: Diagnosis not present

## 2021-06-19 DIAGNOSIS — M1990 Unspecified osteoarthritis, unspecified site: Secondary | ICD-10-CM | POA: Diagnosis not present

## 2021-06-19 DIAGNOSIS — Z1389 Encounter for screening for other disorder: Secondary | ICD-10-CM | POA: Diagnosis not present

## 2021-06-23 DIAGNOSIS — M5030 Other cervical disc degeneration, unspecified cervical region: Secondary | ICD-10-CM | POA: Diagnosis not present

## 2021-06-23 DIAGNOSIS — Z79891 Long term (current) use of opiate analgesic: Secondary | ICD-10-CM | POA: Diagnosis not present

## 2021-06-23 DIAGNOSIS — G894 Chronic pain syndrome: Secondary | ICD-10-CM | POA: Diagnosis not present

## 2021-06-25 IMAGING — DX DG LUMBAR SPINE COMPLETE 4+V
5 series · 5 of 5 positions shown · non-contrast
Comparison: None.

CLINICAL DATA: Lumbosacral back pain. Patient reports pain for 2
weeks. No known injury.

EXAM:
LUMBAR SPINE - COMPLETE 4+ VIEW

[l-spine ap (1 of 3)]
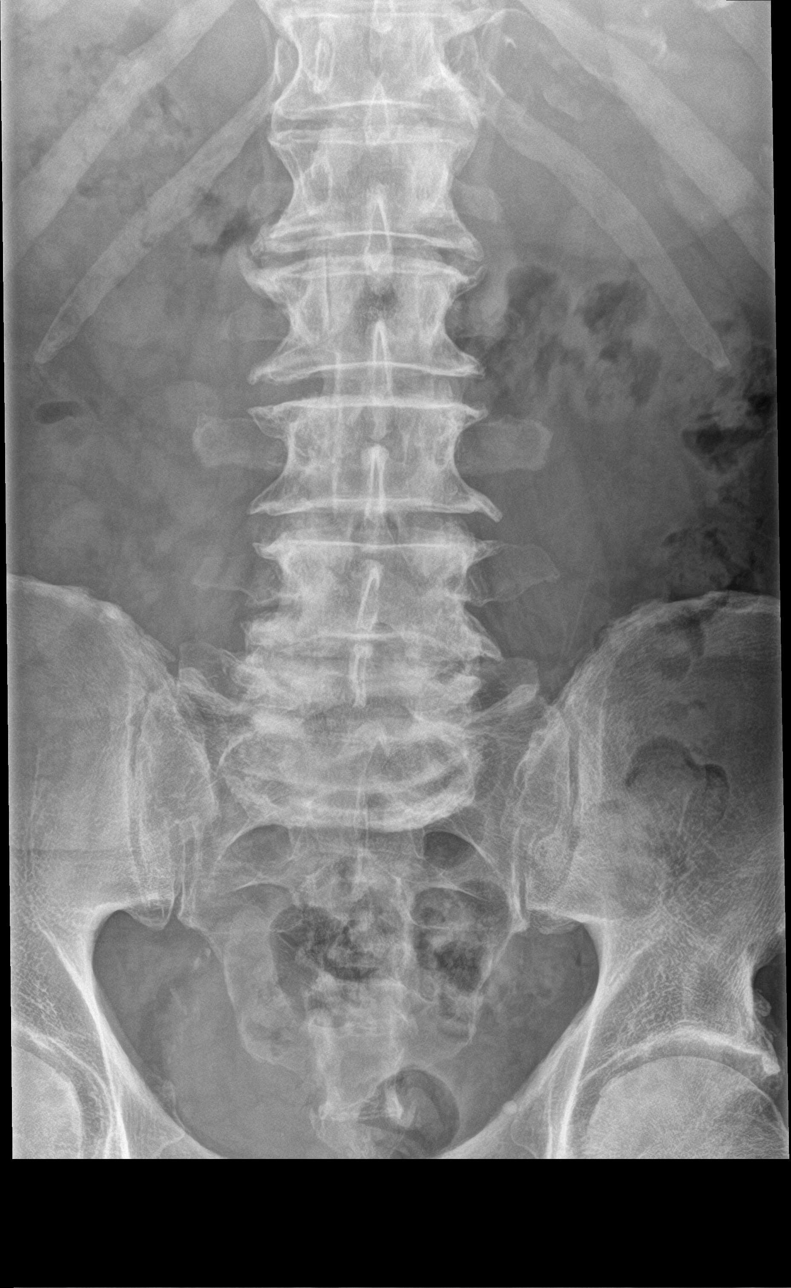

[l-spine lat]
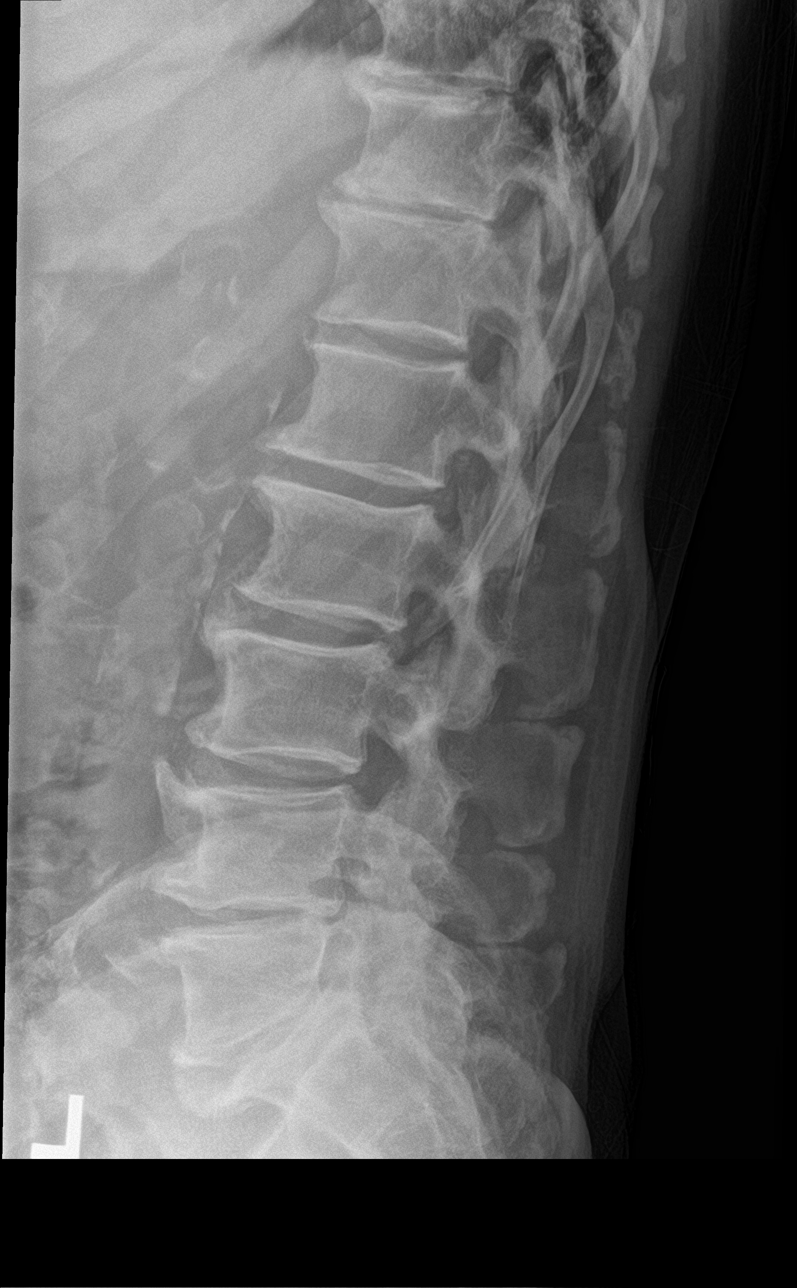

[l-spine spot]
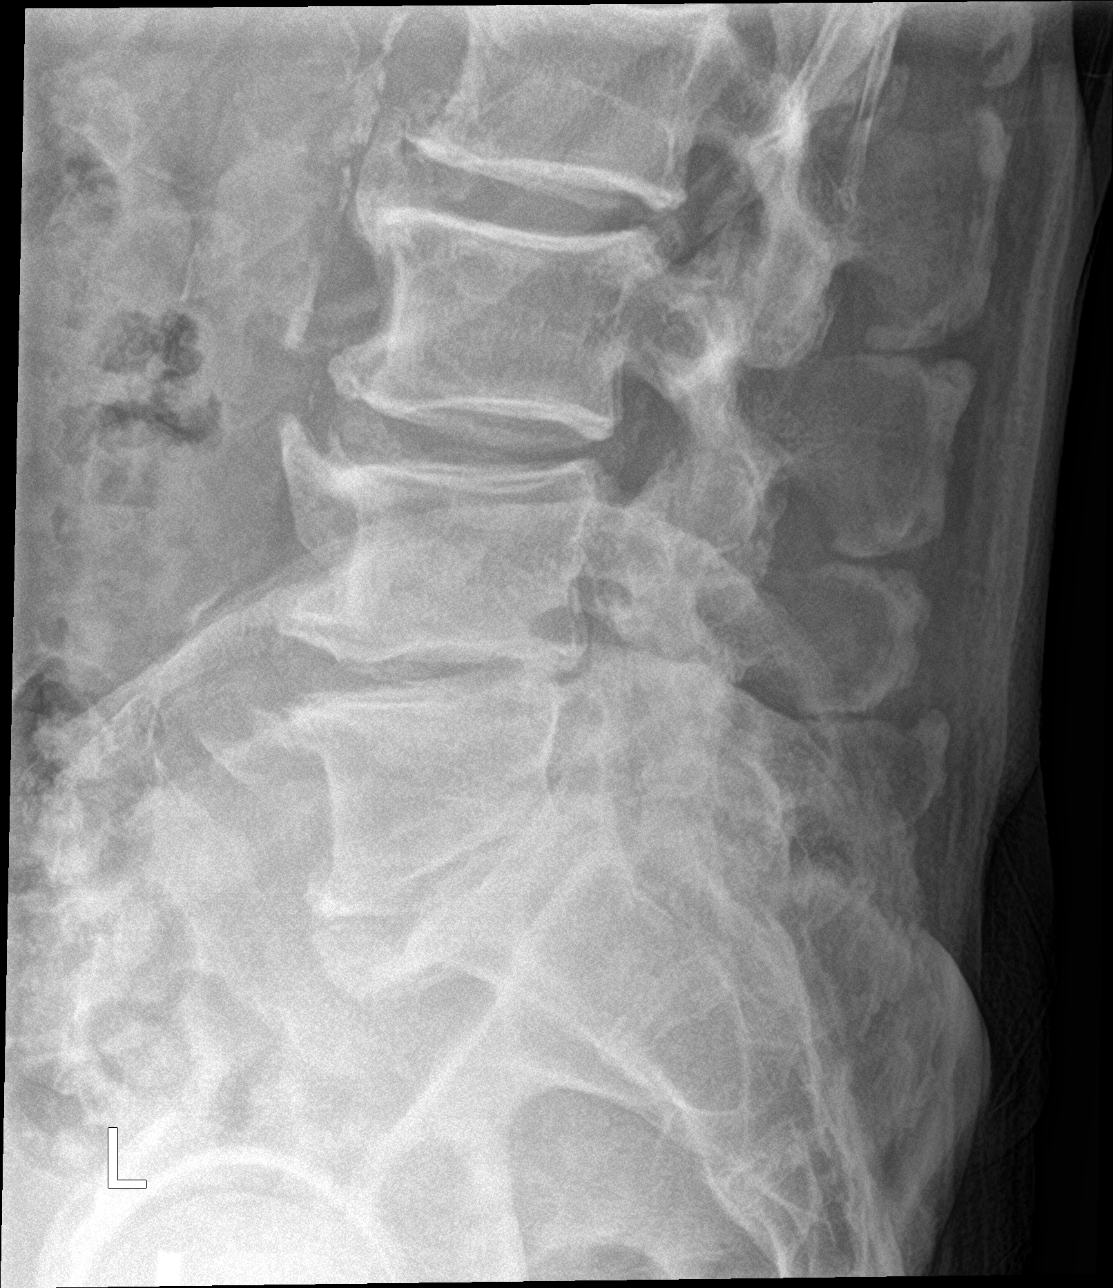

[l-spine ap (2 of 3)]
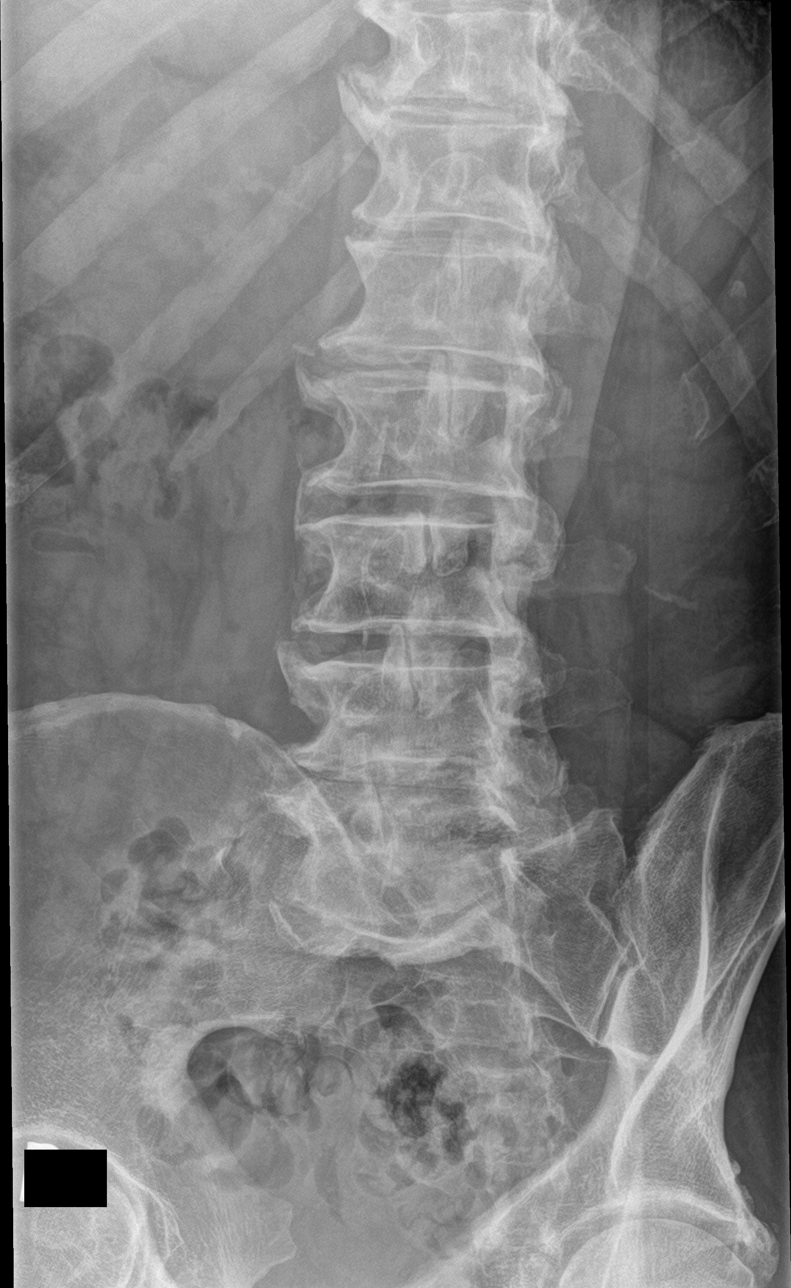

[l-spine ap (3 of 3)]
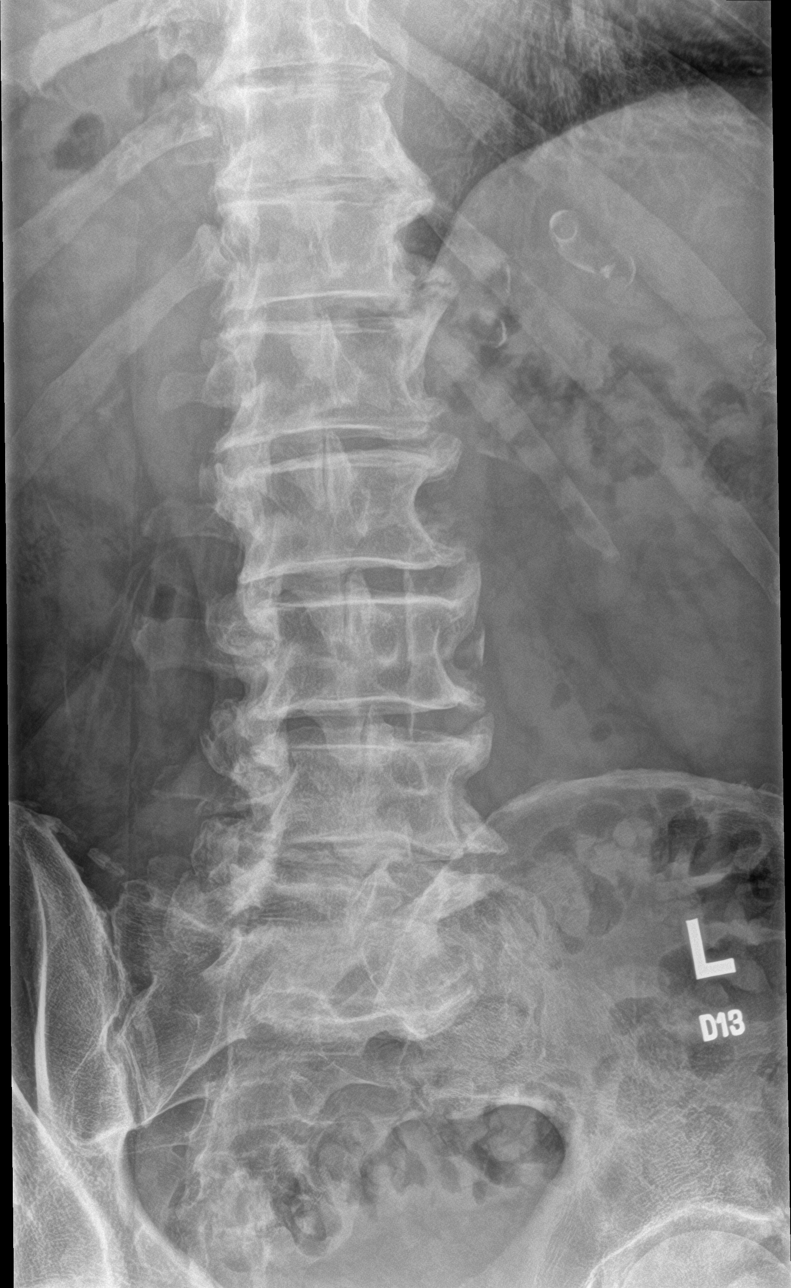

[5 of 5 positions shown; findings below may reference images not displayed]

FINDINGS: 9 mm anterolisthesis of L5 on S1. There is facet hypertrophy at
L5-S1, no obvious pars defects by radiograph. Trace retrolisthesis
of L4 on L5, also likely facet mediated with moderate facet
hypertrophy. Multilevel endplate spurring throughout the lumbar
spine, mild associated disc space narrowing throughout, most
prominent at L4-L5 and L5-S1. Vertebral body heights are preserved.
No evidence of fracture or focal bone lesion. There are vascular
calcifications. Sacroiliac joints are congruent.
IMPRESSION: 1. Grade 1 anterolisthesis of L5 on S1, likely secondary to facet
hypertrophy. No obvious pars defects by radiograph.
2. Diffuse multilevel degenerative disc disease.
3. Moderate facet hypertrophy in the lower lumbar spine.

## 2021-06-30 DIAGNOSIS — M47812 Spondylosis without myelopathy or radiculopathy, cervical region: Secondary | ICD-10-CM | POA: Diagnosis not present

## 2021-06-30 DIAGNOSIS — R202 Paresthesia of skin: Secondary | ICD-10-CM | POA: Diagnosis not present

## 2021-07-04 ENCOUNTER — Other Ambulatory Visit (HOSPITAL_COMMUNITY): Payer: Self-pay | Admitting: Neurosurgery

## 2021-07-04 DIAGNOSIS — G8929 Other chronic pain: Secondary | ICD-10-CM

## 2021-07-10 ENCOUNTER — Ambulatory Visit (HOSPITAL_COMMUNITY)
Admission: RE | Admit: 2021-07-10 | Discharge: 2021-07-10 | Disposition: A | Discharge status: Home / Self Care | Payer: Medicare HMO | Source: Ambulatory Visit | Attending: Neurosurgery | Admitting: Neurosurgery

## 2021-07-10 DIAGNOSIS — G8929 Other chronic pain: Secondary | ICD-10-CM | POA: Diagnosis not present

## 2021-07-10 DIAGNOSIS — M542 Cervicalgia: Secondary | ICD-10-CM | POA: Diagnosis not present

## 2021-07-11 ENCOUNTER — Other Ambulatory Visit (HOSPITAL_COMMUNITY): Payer: Self-pay | Admitting: Gerontology

## 2021-07-11 ENCOUNTER — Ambulatory Visit (HOSPITAL_COMMUNITY)
Admission: RE | Admit: 2021-07-11 | Discharge: 2021-07-11 | Disposition: A | Discharge status: Home / Self Care | Payer: Medicare HMO | Source: Ambulatory Visit | Attending: Gerontology | Admitting: Gerontology

## 2021-07-11 DIAGNOSIS — M542 Cervicalgia: Secondary | ICD-10-CM

## 2021-07-11 DIAGNOSIS — M47812 Spondylosis without myelopathy or radiculopathy, cervical region: Secondary | ICD-10-CM | POA: Diagnosis not present

## 2021-07-14 ENCOUNTER — Encounter: Payer: Self-pay | Admitting: Urology

## 2021-07-14 ENCOUNTER — Ambulatory Visit (INDEPENDENT_AMBULATORY_CARE_PROVIDER_SITE_OTHER): Payer: Medicare HMO | Admitting: Urology

## 2021-07-14 VITALS — BP 154/77 | HR 73

## 2021-07-14 DIAGNOSIS — N401 Enlarged prostate with lower urinary tract symptoms: Secondary | ICD-10-CM

## 2021-07-14 DIAGNOSIS — R351 Nocturia: Secondary | ICD-10-CM

## 2021-07-14 DIAGNOSIS — M47812 Spondylosis without myelopathy or radiculopathy, cervical region: Secondary | ICD-10-CM | POA: Diagnosis not present

## 2021-07-14 DIAGNOSIS — R339 Retention of urine, unspecified: Secondary | ICD-10-CM

## 2021-07-14 DIAGNOSIS — G8929 Other chronic pain: Secondary | ICD-10-CM | POA: Diagnosis not present

## 2021-07-14 DIAGNOSIS — R202 Paresthesia of skin: Secondary | ICD-10-CM | POA: Diagnosis not present

## 2021-07-14 DIAGNOSIS — N138 Other obstructive and reflux uropathy: Secondary | ICD-10-CM | POA: Diagnosis not present

## 2021-07-14 DIAGNOSIS — M25511 Pain in right shoulder: Secondary | ICD-10-CM | POA: Diagnosis not present

## 2021-07-14 DIAGNOSIS — M542 Cervicalgia: Secondary | ICD-10-CM | POA: Diagnosis not present

## 2021-07-14 DIAGNOSIS — M7918 Myalgia, other site: Secondary | ICD-10-CM | POA: Diagnosis not present

## 2021-07-14 LAB — URINALYSIS, ROUTINE W REFLEX MICROSCOPIC
Bilirubin, UA: NEGATIVE
Glucose, UA: NEGATIVE
Ketones, UA: NEGATIVE
Leukocytes,UA: NEGATIVE
Nitrite, UA: NEGATIVE
Protein,UA: NEGATIVE
RBC, UA: NEGATIVE
Specific Gravity, UA: 1.015 (ref 1.005–1.030)
Urobilinogen, Ur: 1 mg/dL (ref 0.2–1.0)
pH, UA: 7.5 (ref 5.0–7.5)

## 2021-07-14 LAB — BLADDER SCAN AMB NON-IMAGING: Scan Result: 96

## 2021-07-14 MED ORDER — ALFUZOSIN HCL ER 10 MG PO TB24: 10.0000 mg | ORAL_TABLET | Freq: Every day | ORAL | 3 refills | Status: DC

## 2021-07-14 MED ORDER — MIRABEGRON ER 25 MG PO TB24: 25.0000 mg | ORAL_TABLET | Freq: Every day | ORAL | 0 refills | Status: DC

## 2021-07-14 NOTE — Patient Instructions (Signed)

## 2021-07-14 NOTE — Progress Notes (Signed)
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 PVR 96cc. IPSS 4 QOL 3 on uroxatral. His biggest complaint sis nocturia 3x. Urine stream strong. No daytime frequency. No straining to urinate. No other complaints today.    PMH: Past Medical History:  Diagnosis Date   Diabetes mellitus    A1c of 7 -12/2009   DJD (degenerative joint disease)    left knee,cervical spine,lumbosacral spine   DVT (deep venous thrombosis) ([place]) 12/2009   left lower extremity  following left TKA   Hyperlipidemia    Hypertension    Obesity     Surgical History: Past Surgical History:  Procedure Laterality Date   CERVICAL DISCECTOMY     and fusion   COLONOSCOPY  08/15/2011   Procedure: COLONOSCOPY;  Surgeon: Daneil Dolin, MD;  [facility]: [facility];  Service: Endoscopy;  Laterality: N/A;  11:15 AM   COLONOSCOPY W/ POLYPECTOMY  2003   TOTAL KNEE ARTHROPLASTY  2011   TOTAL KNEE ARTHROPLASTY  03/2008   TOTAL SHOULDER ARTHROPLASTY  05/2009   right     Home Medications:  Allergies as of 07/14/2021   No Known Allergies      Medication List        Accurate as of July 14, 2021  9:05 AM. If you have any questions, ask your nurse or doctor.          STOP taking these medications    doxycycline 100 MG capsule Commonly known as: VIBRAMYCIN       TAKE these medications    alfuzosin 10 MG 24 hr tablet Commonly known as: UROXATRAL Take 1 tablet (10 mg total) by mouth at bedtime.   baclofen 10 MG tablet Commonly known as: LIORESAL Take 1 tablet by mouth daily.   citalopram 10 MG tablet Commonly known as: CELEXA Take by mouth.   gabapentin 800 MG tablet Commonly known as: NEURONTIN Take 800 mg by mouth 3 (three) times daily.   glipiZIDE  5 MG tablet Commonly known as: GLUCOTROL Take 5 mg by mouth 2 (two) times daily before a meal. Pt takes one tablet in the AM   HYDROcodone-acetaminophen 10-325 MG tablet Commonly known as: NORCO Take 1 tablet by mouth 4 (four) times daily as needed.   ipratropium-albuterol 0.5-2.5 (3) MG/3ML Soln Commonly known as: DUONEB Take 0.5 mLs by nebulization 4 (four) times daily as needed.   losartan-hydrochlorothiazide 50-12.5 MG tablet Commonly known as: HYZAAR Take 1 tablet by mouth daily.   meclizine 25 MG tablet Commonly known as: ANTIVERT Take 25 mg by mouth 2 (two) times daily as needed for dizziness.   metFORMIN 500 MG tablet Commonly known as: GLUCOPHAGE Take 1 tablet (500 mg total) by mouth 2 (two) times daily with a meal. Takes one tablet in the AM   metoprolol succinate 50 MG 24 hr tablet Commonly known as: TOPROL-XL Take 50 mg by mouth daily.   montelukast 10 MG tablet Commonly known as: SINGULAIR   multivitamin with minerals tablet Take 1 tablet by mouth daily.   NON FORMULARY [facility]  Peripheral Neuropathy Cream- Bupivacaine 1%, Doxepin 3%, Gabapentin 6%, Pentoxifylline 3%, Topiramate 1% Apply 1-2 grams to affected area 3-4 times daily Qty. 120 gm 3 refills   omeprazole 20 MG capsule  Commonly known as: PRILOSEC   predniSONE 50 MG tablet Commonly known as: DELTASONE Take 1 tablet (50 mg total) by mouth daily with breakfast.   simvastatin 40 MG tablet Commonly known as: ZOCOR Take 40 mg by mouth at bedtime.   tadalafil 5 MG tablet Commonly known as: CIALIS Take 1 tablet (5 mg total) by mouth daily as needed for erectile dysfunction.   Tradjenta 5 MG Tabs tablet Generic drug: linagliptin   Vitamin D3 50 MCG (2000 UT) Tabs Take 1 tablet by mouth daily.        Allergies: No Known Allergies  Family History: No family history on file.  Social History:  reports that he has never smoked. He has never used smokeless tobacco. He reports  that he does not drink alcohol and does not use drugs.  ROS: All other review of systems were reviewed and are negative except what is noted above in HPI  Physical Exam: BP (!) 154/77   Pulse 73   Constitutional:  Alert and oriented, No acute distress. HEENT: [place] AT, moist mucus membranes.  Trachea midline, no masses. Cardiovascular: No clubbing, cyanosis, or edema. Respiratory: Normal respiratory effort, no increased work of breathing. GI: Abdomen is soft, nontender, nondistended, no abdominal masses GU: No CVA tenderness.  Lymph: No cervical or inguinal lymphadenopathy. Skin: No rashes, bruises or suspicious lesions. Neurologic: Grossly intact, no focal deficits, moving all 4 extremities. Psychiatric: Normal mood and affect.  Laboratory Data: Lab Results  Component Value Date   WBC 13.7 (H) 01/04/2020   HGB 14.6 01/04/2020   HCT 42.2 01/04/2020   MCV 87.9 01/04/2020   PLT 307 01/04/2020    Lab Results  Component Value Date   CREATININE 0.99 06/20/2020    No results found for: "PSA"  No results found for: "TESTOSTERONE"  Lab Results  Component Value Date   HGBA1C 8.0 (H) 01/02/2020    Urinalysis    Component Value Date/Time   COLORURINE YELLOW 12/23/2009 0151   APPEARANCEUR Clear 04/18/2021 1200   LABSPEC 1.020 12/23/2009 0151   PHURINE 7.5 12/23/2009 0151   GLUCOSEU Negative 04/18/2021 1200   HGBUR NEGATIVE 12/23/2009 0151   BILIRUBINUR Negative 04/18/2021 1200   KETONESUR 40 (A) 12/23/2009 0151   PROTEINUR Negative 04/18/2021 1200   PROTEINUR NEGATIVE 12/23/2009 0151   UROBILINOGEN 1.0 12/23/2009 0151   NITRITE Negative 04/18/2021 1200   NITRITE NEGATIVE 12/23/2009 0151   LEUKOCYTESUR Negative 04/18/2021 1200    Lab Results  Component Value Date   LABMICR Comment 04/18/2021   WBCUA None seen 01/23/2021   LABEPIT None seen 01/23/2021   BACTERIA None seen 01/23/2021    Pertinent Imaging:  No results found for this or any previous visit.  No  results found for this or any previous visit.  No results found for this or any previous visit.  No results found for this or any previous visit.  No results found for this or any previous visit.  No results found for this or any previous visit.  No results found for this or any previous visit.  No results found for this or any previous visit.   Assessment & Plan:    1. Incomplete bladder emptying -Continue uroxatral '10mg'$  qhs - Urinalysis, Routine w reflex microscopic - BLADDER SCAN AMB NON-IMAGING  2. BPH with urinary obstruction -continue uroxatral '10mg'$  qhs  3. Nocturia -we will trial mirabegron '25mg'$  daily   No follow-ups on file.  Nicolette Bang, MD  [facility] [place]

## 2021-07-20 DIAGNOSIS — E1142 Type 2 diabetes mellitus with diabetic polyneuropathy: Secondary | ICD-10-CM | POA: Diagnosis not present

## 2021-07-20 DIAGNOSIS — I10 Essential (primary) hypertension: Secondary | ICD-10-CM | POA: Diagnosis not present

## 2021-07-24 DIAGNOSIS — D0462 Carcinoma in situ of skin of left upper limb, including shoulder: Secondary | ICD-10-CM | POA: Diagnosis not present

## 2021-07-24 DIAGNOSIS — D485 Neoplasm of uncertain behavior of skin: Secondary | ICD-10-CM | POA: Diagnosis not present

## 2021-07-24 DIAGNOSIS — C44629 Squamous cell carcinoma of skin of left upper limb, including shoulder: Secondary | ICD-10-CM | POA: Diagnosis not present

## 2021-07-24 DIAGNOSIS — C44622 Squamous cell carcinoma of skin of right upper limb, including shoulder: Secondary | ICD-10-CM | POA: Diagnosis not present

## 2021-07-26 ENCOUNTER — Ambulatory Visit (HOSPITAL_COMMUNITY): Payer: Medicare HMO | Admitting: Physical Therapy

## 2021-08-01 ENCOUNTER — Encounter: Payer: Self-pay | Admitting: Internal Medicine

## 2021-08-07 ENCOUNTER — Encounter: Payer: Self-pay | Admitting: *Deleted

## 2021-08-09 ENCOUNTER — Institutional Professional Consult (permissible substitution): Payer: Medicare HMO | Admitting: Neurology

## 2021-08-14 DIAGNOSIS — H524 Presbyopia: Secondary | ICD-10-CM | POA: Diagnosis not present

## 2021-08-16 ENCOUNTER — Ambulatory Visit: Payer: Medicare HMO | Admitting: Urology

## 2021-08-16 DIAGNOSIS — R339 Retention of urine, unspecified: Secondary | ICD-10-CM

## 2021-08-19 DIAGNOSIS — E1142 Type 2 diabetes mellitus with diabetic polyneuropathy: Secondary | ICD-10-CM | POA: Diagnosis not present

## 2021-08-19 DIAGNOSIS — I10 Essential (primary) hypertension: Secondary | ICD-10-CM | POA: Diagnosis not present

## 2021-08-21 ENCOUNTER — Ambulatory Visit (HOSPITAL_COMMUNITY): Payer: Medicare HMO | Admitting: Physical Therapy

## 2021-08-28 ENCOUNTER — Encounter: Payer: Self-pay | Admitting: Urology

## 2021-08-28 ENCOUNTER — Ambulatory Visit (INDEPENDENT_AMBULATORY_CARE_PROVIDER_SITE_OTHER): Payer: Medicare HMO | Admitting: Urology

## 2021-08-28 VITALS — BP 143/74 | HR 67

## 2021-08-28 DIAGNOSIS — N401 Enlarged prostate with lower urinary tract symptoms: Secondary | ICD-10-CM | POA: Diagnosis not present

## 2021-08-28 DIAGNOSIS — N5201 Erectile dysfunction due to arterial insufficiency: Secondary | ICD-10-CM

## 2021-08-28 DIAGNOSIS — N138 Other obstructive and reflux uropathy: Secondary | ICD-10-CM | POA: Diagnosis not present

## 2021-08-28 DIAGNOSIS — R339 Retention of urine, unspecified: Secondary | ICD-10-CM | POA: Diagnosis not present

## 2021-08-28 MED ORDER — SILODOSIN 8 MG PO CAPS: 8.0000 mg | ORAL_CAPSULE | Freq: Every day | ORAL | 11 refills | Status: DC

## 2021-08-28 MED ORDER — TADALAFIL 5 MG PO TABS: 5.0000 mg | ORAL_TABLET | Freq: Every day | ORAL | 11 refills | Status: DC | PRN

## 2021-08-28 NOTE — Progress Notes (Signed)
post void residual=279

## 2021-08-28 NOTE — Patient Instructions (Signed)

## 2021-08-28 NOTE — Progress Notes (Signed)
08/28/2021 1:51 PM   [name] 05/07/1940 [id number]  Referring provider: Carrolyn Meiers, MD [place],  [place] [address]  Followup urinary frequency  HPI: [name] is a 81yo here for followup for BPH with urinary frequency and erectile dysfunction IPSS 5 QOL 3 on uroxatral. Urine stream strong when he remembers to take the uroxatral. No straining to urinate. Nocturia 2-3x. He continues to have issues getting and maintaining an erection  PMH: Past Medical History:  Diagnosis Date   Diabetes mellitus    A1c of 7 -12/2009   DJD (degenerative joint disease)    left knee,cervical spine,lumbosacral spine   DVT (deep venous thrombosis) ([place]) 12/2009   left lower extremity  following left TKA   Hyperlipidemia    Hypertension    Obesity     Surgical History: Past Surgical History:  Procedure Laterality Date   CERVICAL DISCECTOMY     and fusion   COLONOSCOPY  08/15/2011   Procedure: COLONOSCOPY;  Surgeon: Daneil Dolin, MD;  [facility];  Service: Endoscopy;  Laterality: N/A;  11:15 AM   COLONOSCOPY W/ POLYPECTOMY  2003   TOTAL KNEE ARTHROPLASTY  2011   TOTAL KNEE ARTHROPLASTY  03/2008   TOTAL SHOULDER ARTHROPLASTY  05/2009   right     Home Medications:  Allergies as of 08/28/2021   No Known Allergies      Medication List        Accurate as of August 28, 2021  1:51 PM. If you have any questions, ask your nurse or doctor.          alfuzosin 10 MG 24 hr tablet Commonly known as: UROXATRAL Take 1 tablet (10 mg total) by mouth at bedtime.   baclofen 10 MG tablet Commonly known as: LIORESAL Take 1 tablet by mouth daily.   citalopram 10 MG tablet Commonly known as: CELEXA Take by mouth.   gabapentin 800 MG tablet Commonly known as: NEURONTIN Take 800 mg by mouth 3 (three) times daily.   glipiZIDE 5 MG tablet Commonly known as: GLUCOTROL Take 5 mg by mouth 2 (two) times daily before a meal. Pt takes one tablet  in the AM   HYDROcodone-acetaminophen 10-325 MG tablet Commonly known as: NORCO Take 1 tablet by mouth 4 (four) times daily as needed.   ipratropium-albuterol 0.5-2.5 (3) MG/3ML Soln Commonly known as: DUONEB Take 0.5 mLs by nebulization 4 (four) times daily as needed.   losartan-hydrochlorothiazide 50-12.5 MG tablet Commonly known as: HYZAAR Take 1 tablet by mouth daily.   meclizine 25 MG tablet Commonly known as: ANTIVERT Take 25 mg by mouth 2 (two) times daily as needed for dizziness.   metFORMIN 500 MG tablet Commonly known as: GLUCOPHAGE Take 1 tablet (500 mg total) by mouth 2 (two) times daily with a meal. Takes one tablet in the AM   metoprolol succinate 50 MG 24 hr tablet Commonly known as: TOPROL-XL Take 50 mg by mouth daily.   mirabegron ER 25 MG Tb24 tablet Commonly known as: MYRBETRIQ Take 1 tablet (25 mg total) by mouth daily.   montelukast 10 MG tablet Commonly known as: SINGULAIR   multivitamin with minerals tablet Take 1 tablet by mouth daily.   NON FORMULARY [facility]  Peripheral Neuropathy Cream- Bupivacaine 1%, Doxepin 3%, Gabapentin 6%, Pentoxifylline 3%, Topiramate 1% Apply 1-2 grams to affected area 3-4 times daily Qty. 120 gm 3 refills   omeprazole 20 MG capsule Commonly known as: PRILOSEC  predniSONE 50 MG tablet Commonly known as: DELTASONE Take 1 tablet (50 mg total) by mouth daily with breakfast.   simvastatin 40 MG tablet Commonly known as: ZOCOR Take 40 mg by mouth at bedtime.   tadalafil 5 MG tablet Commonly known as: CIALIS Take 1 tablet (5 mg total) by mouth daily as needed for erectile dysfunction.   Tradjenta 5 MG Tabs tablet Generic drug: linagliptin   Vitamin D3 50 MCG (2000 UT) Tabs Take 1 tablet by mouth daily.        Allergies: No Known Allergies  Family History: No family history on file.  Social History:  reports that he has never smoked. He has never used smokeless tobacco. He reports that he  does not drink alcohol and does not use drugs.  ROS: All other review of systems were reviewed and are negative except what is noted above in HPI  Physical Exam: BP (!) 143/74   Pulse 67   Constitutional:  Alert and oriented, No acute distress. HEENT: [name] AT, moist mucus membranes.  Trachea midline, no masses. Cardiovascular: No clubbing, cyanosis, or edema. Respiratory: Normal respiratory effort, no increased work of breathing. GI: Abdomen is soft, nontender, nondistended, no abdominal masses GU: No CVA tenderness.  Lymph: No cervical or inguinal lymphadenopathy. Skin: No rashes, bruises or suspicious lesions. Neurologic: Grossly intact, no focal deficits, moving all 4 extremities. Psychiatric: Normal mood and affect.  Laboratory Data: Lab Results  Component Value Date   WBC 13.7 (H) 01/04/2020   HGB 14.6 01/04/2020   HCT 42.2 01/04/2020   MCV 87.9 01/04/2020   PLT 307 01/04/2020    Lab Results  Component Value Date   CREATININE 0.99 06/20/2020    No results found for: "PSA"  No results found for: "TESTOSTERONE"  Lab Results  Component Value Date   HGBA1C 8.0 (H) 01/02/2020    Urinalysis    Component Value Date/Time   COLORURINE YELLOW 12/23/2009 0151   APPEARANCEUR Clear 07/14/2021 1005   LABSPEC 1.020 12/23/2009 0151   PHURINE 7.5 12/23/2009 0151   GLUCOSEU Negative 07/14/2021 1005   HGBUR NEGATIVE 12/23/2009 0151   BILIRUBINUR Negative 07/14/2021 1005   KETONESUR 40 (A) 12/23/2009 0151   PROTEINUR Negative 07/14/2021 1005   PROTEINUR NEGATIVE 12/23/2009 0151   UROBILINOGEN 1.0 12/23/2009 0151   NITRITE Negative 07/14/2021 1005   NITRITE NEGATIVE 12/23/2009 0151   LEUKOCYTESUR Negative 07/14/2021 1005    Lab Results  Component Value Date   LABMICR Comment 07/14/2021   WBCUA None seen 01/23/2021   LABEPIT None seen 01/23/2021   BACTERIA None seen 01/23/2021    Pertinent Imaging:  No results found for this or any previous visit.  No results  found for this or any previous visit.  No results found for this or any previous visit.  No results found for this or any previous visit.  No results found for this or any previous visit.  No results found for this or any previous visit.  No results found for this or any previous visit.  No results found for this or any previous visit.   Assessment & Plan:    1. Incomplete bladder emptying -rapaflo '8mg'$  daily - BLADDER SCAN AMB NON-IMAGING  2. BPH with urinary obstruction Rapaflo '8mg'$  daily - Urine Microscopic  3. Erectile dysfunction Tadalafil '5mg'$  prn   No follow-ups on file.  Nicolette Bang, MD  [facility] [place]

## 2021-08-30 ENCOUNTER — Telehealth: Payer: Self-pay

## 2021-08-30 ENCOUNTER — Other Ambulatory Visit: Payer: Self-pay

## 2021-08-30 DIAGNOSIS — N5201 Erectile dysfunction due to arterial insufficiency: Secondary | ICD-10-CM

## 2021-08-30 MED ORDER — TADALAFIL 5 MG PO TABS: 5.0000 mg | ORAL_TABLET | Freq: Every day | ORAL | 11 refills | Status: DC | PRN

## 2021-08-30 NOTE — Telephone Encounter (Signed)
Patient called to see why only one rx was sent to pharmacy.  Checked med list, other rx was printed and given to patient during visit.  Tried to call patient several times to inform him of this with no answer and no way to leave voicemail.  Will try again at later time.

## 2021-08-30 NOTE — Progress Notes (Signed)
Rx sent by fax.

## 2021-09-01 DIAGNOSIS — D0462 Carcinoma in situ of skin of left upper limb, including shoulder: Secondary | ICD-10-CM | POA: Diagnosis not present

## 2021-09-01 DIAGNOSIS — L905 Scar conditions and fibrosis of skin: Secondary | ICD-10-CM | POA: Diagnosis not present

## 2021-09-14 ENCOUNTER — Other Ambulatory Visit: Payer: Self-pay | Admitting: Neurosurgery

## 2021-09-14 ENCOUNTER — Other Ambulatory Visit (HOSPITAL_COMMUNITY): Payer: Self-pay | Admitting: Neurosurgery

## 2021-09-14 DIAGNOSIS — R202 Paresthesia of skin: Secondary | ICD-10-CM

## 2021-09-14 DIAGNOSIS — G8929 Other chronic pain: Secondary | ICD-10-CM

## 2021-09-14 DIAGNOSIS — M47812 Spondylosis without myelopathy or radiculopathy, cervical region: Secondary | ICD-10-CM

## 2021-09-18 ENCOUNTER — Institutional Professional Consult (permissible substitution): Payer: Medicare HMO | Admitting: Neurology

## 2021-09-19 DIAGNOSIS — L538 Other specified erythematous conditions: Secondary | ICD-10-CM | POA: Diagnosis not present

## 2021-09-19 DIAGNOSIS — Z789 Other specified health status: Secondary | ICD-10-CM | POA: Diagnosis not present

## 2021-09-19 DIAGNOSIS — L298 Other pruritus: Secondary | ICD-10-CM | POA: Diagnosis not present

## 2021-09-19 DIAGNOSIS — I10 Essential (primary) hypertension: Secondary | ICD-10-CM | POA: Diagnosis not present

## 2021-09-19 DIAGNOSIS — L820 Inflamed seborrheic keratosis: Secondary | ICD-10-CM | POA: Diagnosis not present

## 2021-09-19 DIAGNOSIS — C44629 Squamous cell carcinoma of skin of left upper limb, including shoulder: Secondary | ICD-10-CM | POA: Diagnosis not present

## 2021-09-19 DIAGNOSIS — E1165 Type 2 diabetes mellitus with hyperglycemia: Secondary | ICD-10-CM | POA: Diagnosis not present

## 2021-09-19 DIAGNOSIS — R208 Other disturbances of skin sensation: Secondary | ICD-10-CM | POA: Diagnosis not present

## 2021-10-07 ENCOUNTER — Ambulatory Visit (HOSPITAL_COMMUNITY)
Admission: RE | Admit: 2021-10-07 | Discharge: 2021-10-07 | Disposition: A | Discharge status: Home / Self Care | Payer: Medicare HMO | Source: Ambulatory Visit | Attending: Neurosurgery | Admitting: Neurosurgery

## 2021-10-07 DIAGNOSIS — M542 Cervicalgia: Secondary | ICD-10-CM | POA: Diagnosis not present

## 2021-10-07 DIAGNOSIS — M47812 Spondylosis without myelopathy or radiculopathy, cervical region: Secondary | ICD-10-CM | POA: Diagnosis not present

## 2021-10-07 DIAGNOSIS — R202 Paresthesia of skin: Secondary | ICD-10-CM | POA: Diagnosis not present

## 2021-10-07 DIAGNOSIS — M4802 Spinal stenosis, cervical region: Secondary | ICD-10-CM | POA: Diagnosis not present

## 2021-10-07 DIAGNOSIS — M4312 Spondylolisthesis, cervical region: Secondary | ICD-10-CM | POA: Diagnosis not present

## 2021-10-07 DIAGNOSIS — G8929 Other chronic pain: Secondary | ICD-10-CM | POA: Diagnosis not present

## 2021-10-11 ENCOUNTER — Ambulatory Visit: Payer: Medicare HMO | Admitting: Neurology

## 2021-10-11 ENCOUNTER — Encounter: Payer: Self-pay | Admitting: Neurology

## 2021-10-11 VITALS — BP 135/71 | HR 55 | Ht 71.0 in | Wt 178.0 lb

## 2021-10-11 DIAGNOSIS — R202 Paresthesia of skin: Secondary | ICD-10-CM | POA: Diagnosis not present

## 2021-10-11 DIAGNOSIS — R29898 Other symptoms and signs involving the musculoskeletal system: Secondary | ICD-10-CM | POA: Diagnosis not present

## 2021-10-11 NOTE — Patient Instructions (Addendum)
It was nice to see you today.  As discussed, we will proceed with an EMG and nerve conduction velocity test, which is an electrical nerve and muscle test, which we will schedule. We will call you with the results. You may have carpal tunnel syndrome, which is a pinched nerve in the wrist.

## 2021-10-11 NOTE — Progress Notes (Signed)
Subjective:    Patient ID: [name] is a 81 y.o. male.  HPI   Interim history:   Dear Dr. Eulas Post,  I saw your patient, [name], upon your kind request in my neurologic clinic today for initial consultation of his paresthesias in the upper extremities.  The patient is an company today.  As you know, [name] is a 81 year old left-handed gentleman with an underlying medical history of diabetes, degenerative joint disease, status post cervical spine surgery in the remote past, arthritis with history of bilateral total knee arthroplasties, right shoulder replacement, chronic neck pain for which he sees Dr. Nelva Bush, history of DVT, hypertension, hyperlipidemia, and overweight state, who reports a several month history of tingling in both hands, particularly digits 4 and 5, left more than right.  He has not had any sustained numbness, symptoms are worse at night and he wakes up with tingling, he is able to rub his hands or shake his hands to alleviate symptoms, he has not had much in the way of weakness.  I reviewed your office note from 06/30/2021.  He was on Norco and gabapentin at the time. He was referred to physical therapy.  He was suspected to have carpal tunnel syndrome and you requested an EMG.  I have evaluated him for tremor in the recent past.  Previously:   06/20/2020: [name] is a 81 year old left-handed gentleman with an underlying medical history of degenerative disc disease, cervical spondylosis with status post surgery and neck injections, diabetes, history of DVT, hypertension, hyperlipidemia and obesity, who presents for reevaluation of his tremors.  The patient is unaccompanied today.  I first met him on 11/25/2018 at the request of Dr. Maryjean Ka, at which time the patient reported a approximately 22-monthhistory of hand tremors affecting primarily the left hand.  He was advised not to start any new medications and increase his water intake as he was not hydrating very  well, estimated that he drank less than 1 cup of water per day.  He was drinking alcohol in the form of beer daily, about 4-5 beers per day.  He was advised to reduce his alcohol intake.  He called in December 2020 reporting a hand tremor.  He was as to recommendations from our office visit in October 2020.   Today, 06/20/2020: He reports that his tremor seems to have become worse in the past 6 or 7 months.  He lost his partner, [name] September 2021.  He reports that he had stopped drinking alcohol regularly in 2021 but after he lost her, he started drinking beer again.  Sometimes he can go a day or 2 without, on average he will drink anywhere from 3-6 daily.  He tries to hydrate well with water and likes to drink Gatorade.  He had blood work through his primary care last week but has not been given results yet.  A1c last year per his report was around we will write above 7.2.  He tries to eat right, admits that DNeoma Lamingused to do a better job with cooking.  He had recently seen a neurosurgeon for ongoing issues with neck pain and had received injections into the neck but surgery was not recommended.  He has had neck pain particularly when turning his head to the right.  He was hospitalized in November 2021 with acute respiratory failure secondary to COVID-19. He was treated with remdesivir and steroids.  He has not had any recent falls.     11/25/18: (He)  reports an intermittent tremor in both hands, left more than right for the past 2 months.  He denies a family history of tremors.  I reviewed your office note from 10/08/2018.  He received a cervical spine injection at the time.  He reports epidural steroid injection helped for about 2 weeks.  He has another appointment for a procedure coming up.  He is the third youngest of a total of 10 siblings, all his 4 brothers passed away.  He had 5 sisters, 2 passed away.  Neither sibling had a tremor, he had 3 sons, lost 1 at the age of 61 due to a car accident.   He does not drink a whole lot of water, admits to drinking less than 1 cup of water per day on average, drinks sips of water to get his medicines.  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  He reports that his primary care physician of 30 years has cautioned him repeatedly regarding his alcohol consumption and warned him regarding liver cirrhosis.  The patient is retired.  He is widowed, he lives alone.  He denies any falls.  The tremor does not bother him on a day-to-day basis, he noticed the tremor primarily when he tries to drink something with the left hand.  His Past Medical History Is Significant For: Past Medical History:  Diagnosis Date   Diabetes mellitus    A1c of 7 -12/2009   DJD (degenerative joint disease)    left knee,cervical spine,lumbosacral spine   DVT (deep venous thrombosis) ([place]) 12/06/2009   left lower extremity  following left TKA   Hyperlipidemia    Hypertension    Obesity    Skin cancer    both arms    His Past Surgical History Is Significant For: Past Surgical History:  Procedure Laterality Date   CERVICAL DISCECTOMY     and fusion   COLONOSCOPY  08/15/2011   Procedure: COLONOSCOPY;  Surgeon: Daneil Dolin, MD;  [facility];  Service: Endoscopy;  Laterality: N/A;  11:15 AM   COLONOSCOPY W/ POLYPECTOMY  2003   TOTAL KNEE ARTHROPLASTY  2011   TOTAL KNEE ARTHROPLASTY  03/2008   TOTAL SHOULDER ARTHROPLASTY  05/2009   right     His Family History Is Significant For: Family History  Problem Relation Age of Onset   Lung cancer Brother        smoked   Tremor Neg Hx     His Social History Is Significant For: Social History   Socioeconomic History   Marital status: Widowed    Spouse name: Not on file   Number of children: 3   Years of education: Not on file    Highest education level: Not on file  Occupational History    Employer: RETIRED  Tobacco Use   Smoking status: Never   Smokeless tobacco: Never  Substance and Sexual Activity   Alcohol use: Yes    Alcohol/week: 12.0 - 24.0 standard drinks of alcohol    Types: 12 - 24 Cans of beer per week   Drug use: Never   Sexual activity: Not on file  Other Topics Concern   Not on file  Social History Narrative   Lives at home alone   Right handed   Caffeine: 1  cup/day   Social Determinants of Health   Financial Resource Strain: Not on file  Food Insecurity: Not on file  Transportation Needs: Not on file  Physical Activity: Not on file  Stress: Not on file  Social Connections: Not on file    His Allergies Are:  No Known Allergies:   His Current Medications Are:  Outpatient Encounter Medications as of 10/11/2021  Medication Sig   gabapentin (NEURONTIN) 800 MG tablet Take 800 mg by mouth 3 (three) times daily.   HYDROcodone-acetaminophen (NORCO) 10-325 MG tablet Take 1 tablet by mouth 4 (four) times daily as needed.   losartan-hydrochlorothiazide (HYZAAR) 50-12.5 MG tablet Take 1 tablet by mouth daily.   metFORMIN (GLUCOPHAGE) 500 MG tablet Take 1 tablet (500 mg total) by mouth 2 (two) times daily with a meal. Takes one tablet in the AM   metoprolol succinate (TOPROL-XL) 50 MG 24 hr tablet Take 50 mg by mouth daily.   Multiple Vitamins-Minerals (MULTIVITAMIN WITH MINERALS) tablet Take 1 tablet by mouth daily.   simvastatin (ZOCOR) 40 MG tablet Take 40 mg by mouth at bedtime.   TRADJENTA 5 MG TABS tablet Take 5 mg by mouth daily.   alfuzosin (UROXATRAL) 10 MG 24 hr tablet Take 1 tablet (10 mg total) by mouth at bedtime. (Patient not taking: Reported on 10/11/2021)   baclofen (LIORESAL) 10 MG tablet Take 1 tablet by mouth daily. (Patient not taking: Reported on 10/11/2021)   Cholecalciferol (VITAMIN D3) 2000 UNITS TABS Take 1 tablet by mouth daily. (Patient not taking: Reported on 10/11/2021)    citalopram (CELEXA) 10 MG tablet Take by mouth. (Patient not taking: Reported on 10/11/2021)   glipiZIDE (GLUCOTROL) 5 MG tablet Take 5 mg by mouth 2 (two) times daily before a meal. Pt takes one tablet in the AM (Patient not taking: Reported on 10/11/2021)   ipratropium-albuterol (DUONEB) 0.5-2.5 (3) MG/3ML SOLN Take 0.5 mLs by nebulization 4 (four) times daily as needed. (Patient not taking: Reported on 10/11/2021)   meclizine (ANTIVERT) 25 MG tablet Take 25 mg by mouth 2 (two) times daily as needed for dizziness. (Patient not taking: Reported on 10/11/2021)   mirabegron ER (MYRBETRIQ) 25 MG TB24 tablet Take 1 tablet (25 mg total) by mouth daily. (Patient not taking: Reported on 10/11/2021)   montelukast (SINGULAIR) 10 MG tablet  (Patient not taking: Reported on 10/11/2021)   NON FORMULARY [facility]  Peripheral Neuropathy Cream- Bupivacaine 1%, Doxepin 3%, Gabapentin 6%, Pentoxifylline 3%, Topiramate 1% Apply 1-2 grams to affected area 3-4 times daily Qty. 120 gm 3 refills (Patient not taking: Reported on 10/11/2021)   predniSONE (DELTASONE) 50 MG tablet Take 1 tablet (50 mg total) by mouth daily with breakfast. (Patient not taking: Reported on 10/11/2021)   silodosin (RAPAFLO) 8 MG CAPS capsule Take 1 capsule (8 mg total) by mouth at bedtime. (Patient not taking: Reported on 10/11/2021)   tadalafil (CIALIS) 5 MG tablet Take 1 tablet (5 mg total) by mouth daily as needed for erectile dysfunction. (Patient not taking: Reported on 10/11/2021)   [DISCONTINUED] omeprazole (PRILOSEC) 20 MG capsule  (Patient not taking: Reported on 10/11/2021)   No facility-administered encounter medications on file as of 10/11/2021.  :  Review of Systems:  Out of a complete 14 point review of systems, all are reviewed and negative with the exception of these symptoms as listed below: Review of Systems  Neurological:        Patient is here alone for evaluation of his tingling in his hands. He  states the left is worse than the  right. He states his left hand tremor is about the same but he has also noticed it in his right leg. He also states he has a sensation of something touching the outer portion of his left calf, like a fly is landing on it.     Objective:  Neurological Exam  Physical Exam Physical Examination:   Vitals:   10/11/21 1415  BP: 135/71  Pulse: (!) 55    General Examination: The patient is a very pleasant 81 y.o. male in no acute distress. He appears well-developed and well-nourished and well groomed.   HEENT: Normocephalic, atraumatic, pupils are equal, round and reactive to light, extraocular tracking is preserved, face is symmetric, no facial masking noted.  Normal facial sensation to light touch, temperature and vibration.  Speech is clear without dysarthria or hypophonia.  Hearing is grossly intact to tuning fork.  He has no lip, neck or jaw tremor, neck is supple but he does have decreased range of motion actively and passively.  He has no carotid bruits.  Airway examination reveals mild to moderate mouth dryness and partial dentures on top, tongue protrudes centrally and palate elevates symmetrically.  He has several missing teeth on the bottom.    Chest: Clear to auscultation without wheezing, rhonchi or crackles noted.   Heart: S1+S2+0, regular and normal without murmurs, rubs or gallops noted.    Abdomen: Soft, non-tender and non-distended.   Extremities: There is no pitting edema in the distal lower extremities bilaterally.   Skin: Warm and dry with chronic appearing changes on both hands in keeping with chronic sun exposure.   Musculoskeletal: exam reveals no obvious joint deformities.    Neurologically:  Mental status: The patient is awake, alert and oriented in all 4 spheres. His immediate and remote memory, attention, language skills and fund of knowledge are appropriate. There is no evidence of aphasia, agnosia, apraxia or anomia. Speech is clear with normal prosody and  enunciation. Thought process is linear. Mood is normal and affect is normal.  Cranial nerves II - XII are as described above under HEENT exam.  Motor exam: Normal bulk, strength and tone is noted with the exception of slight decrease in grip strength on the left, slight decrease in hypothenar eminence on the left, negative Tinel's, negative Phalen's. There is no drift, resting tremor or rebound.    (On 11/25/2018: On Archimedes spiral drawing he has slight trembling with the left hand, no trembling with the right hand, handwriting with the left hand which is his dominant hand Is not tremulous, not micrographic and legible.)   He has a very mild and intermittent left upper extremity postural tremor, no action tremor, no intention tremor.  He has no tremor In the right upper extremity or both lower extremities. Reflexes are trace to 1+ in the UEs, absent in the lower extremities.  Fine motor skills and coordination: intact with normal finger taps, normal hand movements, normal rapid alternating patting, normal foot taps and normal foot agility.  Cerebellar testing: No dysmetria or intention tremor. There is no truncal or gait ataxia.  Sensory exam: intact to light touch in the upper and lower extremities.  Gait, station and balance: He stands easily. No veering to one side is noted. No leaning to one side is noted. Posture is age-appropriate and stance is narrow based. Gait shows normal stride length and normal pace. No problems turning are noted. No shuffling noted, preserved arm swing.   Assessment  and Plan:    In summary, [name] is a very pleasant 81 year old right-handed gentleman with an underlying medical history of diabetes, degenerative joint disease, status post cervical spine surgery in the remote past, arthritis with history of bilateral total knee arthroplasties, right shoulder replacement, chronic neck pain for which he sees Dr.  Nelva Bush, history of DVT, hypertension, hyperlipidemia,  and overweight state, who presents for evaluation of his paresthesias in the upper extremities.  Presentation is not classic for carpal tunnel as his paresthesias are primarily confined to his last 2 fingers on each side.  Nevertheless, differential diagnosis includes radiculopathy versus compression neuropathy such as ulnar neuropathy versus median neuropathy.  Symptoms appear to be worse on the left.  I will request an EMG and nerve conduction velocity test to the upper extremities through our office.  We will keep the patient and your office posted of his test results.  I answered all his questions today and in agreement.  I spent 30 minutes in total face-to-face time and in reviewing records during pre-charting, more than 50% of which was spent in counseling and coordination of care, reviewing test results, reviewing medications and treatment regimen and/or in discussing or reviewing the diagnosis of paresthesias of both hands, the prognosis and treatment options. Pertinent laboratory and imaging test results that were available during this visit with the patient were reviewed by me and considered in my medical decision making (see chart for details).

## 2021-10-17 DIAGNOSIS — G894 Chronic pain syndrome: Secondary | ICD-10-CM | POA: Diagnosis not present

## 2021-10-17 DIAGNOSIS — Z5181 Encounter for therapeutic drug level monitoring: Secondary | ICD-10-CM | POA: Diagnosis not present

## 2021-10-17 DIAGNOSIS — M5416 Radiculopathy, lumbar region: Secondary | ICD-10-CM | POA: Diagnosis not present

## 2021-10-17 DIAGNOSIS — Z79899 Other long term (current) drug therapy: Secondary | ICD-10-CM | POA: Diagnosis not present

## 2021-10-17 DIAGNOSIS — Z79891 Long term (current) use of opiate analgesic: Secondary | ICD-10-CM | POA: Diagnosis not present

## 2021-10-17 DIAGNOSIS — M5030 Other cervical disc degeneration, unspecified cervical region: Secondary | ICD-10-CM | POA: Diagnosis not present

## 2021-10-17 DIAGNOSIS — M47812 Spondylosis without myelopathy or radiculopathy, cervical region: Secondary | ICD-10-CM | POA: Diagnosis not present

## 2021-10-20 DIAGNOSIS — E1142 Type 2 diabetes mellitus with diabetic polyneuropathy: Secondary | ICD-10-CM | POA: Diagnosis not present

## 2021-10-20 DIAGNOSIS — M47812 Spondylosis without myelopathy or radiculopathy, cervical region: Secondary | ICD-10-CM | POA: Diagnosis not present

## 2021-10-20 DIAGNOSIS — I10 Essential (primary) hypertension: Secondary | ICD-10-CM | POA: Diagnosis not present

## 2021-11-03 ENCOUNTER — Ambulatory Visit: Payer: Medicare HMO | Admitting: Neurology

## 2021-11-03 ENCOUNTER — Ambulatory Visit (INDEPENDENT_AMBULATORY_CARE_PROVIDER_SITE_OTHER): Payer: Medicare HMO | Admitting: Neurology

## 2021-11-03 ENCOUNTER — Encounter: Payer: Self-pay | Admitting: Neurology

## 2021-11-03 VITALS — BP 135/76 | HR 68 | Ht 71.0 in | Wt 180.5 lb

## 2021-11-03 DIAGNOSIS — R29898 Other symptoms and signs involving the musculoskeletal system: Secondary | ICD-10-CM | POA: Diagnosis not present

## 2021-11-03 DIAGNOSIS — R202 Paresthesia of skin: Secondary | ICD-10-CM

## 2021-11-03 DIAGNOSIS — G5622 Lesion of ulnar nerve, left upper limb: Secondary | ICD-10-CM

## 2021-11-03 DIAGNOSIS — M542 Cervicalgia: Secondary | ICD-10-CM | POA: Diagnosis not present

## 2021-11-03 DIAGNOSIS — G8929 Other chronic pain: Secondary | ICD-10-CM | POA: Diagnosis not present

## 2021-11-03 NOTE — Progress Notes (Unsigned)
No chief complaint on file.     ASSESSMENT AND PLAN  [name] is a 81 y.o. male     DIAGNOSTIC DATA (LABS, IMAGING, TESTING) - I reviewed patient records, labs, notes, testing and imaging myself where available.   MEDICAL HISTORY:  [name], seen in request by   Nicholas Meiers, MD [name],  [place] [id number], Nicholas Meiers, MD   I reviewed and summarized the referring note.PMHX. DM HTN HLD Left leg DVT (following left knee replacement in 2011) Cervical decompression in 1996, he fell from 12 foot,  Right shoulder replacement.     PHYSICAL EXAM:    PHYSICAL EXAMNIATION:  Gen: NAD, conversant, well nourised, well groomed                     Cardiovascular: Regular rate rhythm, no peripheral edema, warm, nontender. Eyes: Conjunctivae clear without exudates or hemorrhage Neck: Supple, no carotid bruits. Pulmonary: Clear to auscultation bilaterally   NEUROLOGICAL EXAM:  MENTAL STATUS: Speech/cognition: Awake, alert, oriented to history taking and casual conversation CRANIAL NERVES: CN II: Visual fields are full to confrontation. Pupils are round equal and briskly reactive to light. CN III, IV, VI: extraocular movement are normal. No ptosis. CN V: Facial sensation is intact to light touch CN VII: Face is symmetric with normal eye closure  CN VIII: Hearing is normal to causal conversation. CN IX, X: Phonation is normal. CN XI: Head turning and shoulder shrug are intact  MOTOR: There is no pronator drift of out-stretched arms. Muscle bulk and tone are normal. Muscle strength is normal.  REFLEXES: Reflexes are 2+ and symmetric at the biceps, triceps, knees, and ankles. Plantar responses are flexor.  SENSORY: Intact to light touch, pinprick and vibratory sensation are intact in fingers and toes.  COORDINATION: There is no trunk or limb dysmetria noted.  GAIT/STANCE: Posture is normal. Gait is steady with  normal steps, base, arm swing, and turning. Heel and toe walking are normal. Tandem gait is normal.  Romberg is absent.  REVIEW OF SYSTEMS:  Full 14 system review of systems performed and notable only for as above All other review of systems were negative.   ALLERGIES: No Known Allergies  HOME MEDICATIONS: Current Outpatient Medications  Medication Sig Dispense Refill   alfuzosin (UROXATRAL) 10 MG 24 hr tablet Take 1 tablet (10 mg total) by mouth at bedtime. (Patient not taking: Reported on 10/11/2021) 90 tablet 3   baclofen (LIORESAL) 10 MG tablet Take 1 tablet by mouth daily. (Patient not taking: Reported on 10/11/2021)     Cholecalciferol (VITAMIN D3) 2000 UNITS TABS Take 1 tablet by mouth daily. (Patient not taking: Reported on 10/11/2021)     citalopram (CELEXA) 10 MG tablet Take by mouth. (Patient not taking: Reported on 10/11/2021)     gabapentin (NEURONTIN) 800 MG tablet Take 800 mg by mouth 3 (three) times daily.     glipiZIDE (GLUCOTROL) 5 MG tablet Take 5 mg by mouth 2 (two) times daily before a meal. Pt takes one tablet in the AM (Patient not taking: Reported on 10/11/2021)     HYDROcodone-acetaminophen (NORCO) 10-325 MG tablet Take 1 tablet by mouth 4 (four) times daily as needed.     ipratropium-albuterol (DUONEB) 0.5-2.5 (3) MG/3ML SOLN Take 0.5 mLs by nebulization 4 (four) times daily as needed. (Patient not taking: Reported on 10/11/2021)     losartan-hydrochlorothiazide (HYZAAR) 50-12.5 MG tablet Take 1 tablet by mouth daily.  meclizine (ANTIVERT) 25 MG tablet Take 25 mg by mouth 2 (two) times daily as needed for dizziness. (Patient not taking: Reported on 10/11/2021)     metFORMIN (GLUCOPHAGE) 500 MG tablet Take 1 tablet (500 mg total) by mouth 2 (two) times daily with a meal. Takes one tablet in the AM 60 tablet 1   metoprolol succinate (TOPROL-XL) 50 MG 24 hr tablet Take 50 mg by mouth daily.     mirabegron ER (MYRBETRIQ) 25 MG TB24 tablet Take 1 tablet (25 mg total) by mouth  daily. (Patient not taking: Reported on 10/11/2021) 30 tablet 0   montelukast (SINGULAIR) 10 MG tablet  (Patient not taking: Reported on 10/11/2021)     Multiple Vitamins-Minerals (MULTIVITAMIN WITH MINERALS) tablet Take 1 tablet by mouth daily.     NON FORMULARY [facility]  Peripheral Neuropathy Cream- Bupivacaine 1%, Doxepin 3%, Gabapentin 6%, Pentoxifylline 3%, Topiramate 1% Apply 1-2 grams to affected area 3-4 times daily Qty. 120 gm 3 refills (Patient not taking: Reported on 10/11/2021)     predniSONE (DELTASONE) 50 MG tablet Take 1 tablet (50 mg total) by mouth daily with breakfast. (Patient not taking: Reported on 10/11/2021) 9 tablet 0   silodosin (RAPAFLO) 8 MG CAPS capsule Take 1 capsule (8 mg total) by mouth at bedtime. (Patient not taking: Reported on 10/11/2021) 30 capsule 11   simvastatin (ZOCOR) 40 MG tablet Take 40 mg by mouth at bedtime.     tadalafil (CIALIS) 5 MG tablet Take 1 tablet (5 mg total) by mouth daily as needed for erectile dysfunction. (Patient not taking: Reported on 10/11/2021) 30 tablet 11   TRADJENTA 5 MG TABS tablet Take 5 mg by mouth daily.     No current facility-administered medications for this visit.    PAST MEDICAL HISTORY: Past Medical History:  Diagnosis Date   Diabetes mellitus    A1c of 7 -12/2009   DJD (degenerative joint disease)    left knee,cervical spine,lumbosacral spine   DVT (deep venous thrombosis) ([place]) 12/06/2009   left lower extremity  following left TKA   Hyperlipidemia    Hypertension    Obesity    Skin cancer    both arms    PAST SURGICAL HISTORY: Past Surgical History:  Procedure Laterality Date   CERVICAL DISCECTOMY     and fusion   COLONOSCOPY  08/15/2011   Procedure: COLONOSCOPY;  Surgeon: Daneil Dolin, MD;  [facility];  Service: Endoscopy;  Laterality: N/A;  11:15 AM   COLONOSCOPY W/ POLYPECTOMY  2003   TOTAL KNEE ARTHROPLASTY  2011   TOTAL KNEE ARTHROPLASTY  03/2008   TOTAL SHOULDER ARTHROPLASTY   05/2009   right     FAMILY HISTORY: Family History  Problem Relation Age of Onset   Lung cancer Brother        smoked   Tremor Neg Hx     SOCIAL HISTORY: Social History   Socioeconomic History   Marital status: Widowed    Spouse name: Not on file   Number of children: 3   Years of education: Not on file   Highest education level: Not on file  Occupational History    Employer: RETIRED  Tobacco Use   Smoking status: Never   Smokeless tobacco: Never  Substance and Sexual Activity   Alcohol use: Yes    Alcohol/week: 12.0 - 24.0 standard drinks of alcohol    Types: 12 - 24 Cans of beer per week   Drug use: Never   Sexual  activity: Not on file  Other Topics Concern   Not on file  Social History Narrative   Lives at home alone   Right handed   Caffeine: 1 cup/day   Social Determinants of Health   Financial Resource Strain: Not on file  Food Insecurity: Not on file  Transportation Needs: Not on file  Physical Activity: Not on file  Stress: Not on file  Social Connections: Not on file  Intimate Partner Violence: Not on file      Marcial Pacas, M.D. Ph.D.  [facility] [address], [place], [place] [address] Ph: [phone] Fax: [phone]  CC:  Nicholas Meiers, MD [place],   [id number]  Nicholas Meiers, MD

## 2021-11-05 NOTE — Procedures (Signed)
Full Name: [name] Gender: Male MRN #: [id number] Date of Birth: [birth year]    Visit Date: 11/03/2021 10:35 Age: 81 Years Referring Physician: Dr. Lucienne Capers Height: 5 feet 11 inch History: 81 year old male, presenting with paresthesia of bilateral fourth and fifth fingers  Summary of the test:  Nerve conduction study:  Left ulnar sensory responses were absent.  Right ulnar sensory response was within normal limit.  Bilateral median sensory response showed moderately prolonged peak latency with mildly decreased snap amplitude.  Left ulnar motor responses showed normal distal latency, normal distal CMAP amplitude, 26% amplitude drop, 21 m/s velocity drop across left elbow.  Left ulnar motor responses were within normal limit.  Bilateral median motor responses showed moderately prolonged distal latency, right side also has mildly decreased CMAP amplitude at distal stimulation site.  Electromyography:  Selected needle examination was performed at bilateral upper extremity muscles and cervical paraspinal muscles.  There is evidence of chronic neuropathic changes involving bilateral T1, C 8, 7, 6, 5 myotomes.  There was no spontaneous activity at bilateral cervical paraspinal muscles.  Conclusion: This is an abnormal study.  There is evidence of moderate median neuropathy across the wrist, consistent with moderate bilateral carpal tunnel syndromes, right worse than left.  There is also evidence of left median neuropathy across the wrist, demyelinating in nature.  In addition, there is evidence of chronic bilateral cervical radiculopathy, involving C5-T1 myotomes.   ------------------------------- Catalina Pizza.D.Ph.D.  [facility] [address], [place], [place] [address] Tel: [phone] Fax: [phone]  Verbal informed consent was obtained from the patient, patient was informed of potential risk of procedure, including bruising, bleeding,  hematoma formation, infection, muscle weakness, muscle pain, numbness, among others.        [name]    Nerve / Sites Muscle Latency Ref. Amplitude Ref. Rel Amp Segments Distance Velocity Ref. Area    ms ms mV mV %  cm m/s m/s mVms  R Median - APB     Wrist APB 5.3 ?4.4 2.2 ?4.0 100 Wrist - APB 7   5.6     Upper arm APB 9.9  4.1  188 Upper arm - Wrist 24 52 ?49 13.7  L Median - APB     Wrist APB 5.2 ?4.4 4.5 ?4.0 100 Wrist - APB 7   14.8     Upper arm APB 9.8  4.2  93.8 Upper arm - Wrist   ?49 15.2     Ulnar Wrist APB 4.6  1.8  42.7 Ulnar Wrist - APB    6.1  R Ulnar - ADM     Wrist ADM 3.3 ?3.3 8.8 ?6.0 100 Wrist - ADM 7   32.3     B.Elbow ADM 5.8  8.9  101 B.Elbow - Wrist 13 52 ?49 33.0     A.Elbow ADM 9.6  7.5  85.1 A.Elbow - B.Elbow 19 50 ?49 28.6  L Ulnar - ADM     Wrist ADM 3.3 ?3.3 6.9 ?6.0 100 Wrist - ADM 7   20.0     B.Elbow ADM 6.0  6.5  94.4 B.Elbow - Wrist 14 52 ?49 20.3     A.Elbow ADM 11.5  4.7  71.6 A.Elbow - B.Elbow 17 31 ?49 18.8             SNC    Nerve / Sites Rec. Site Peak Lat Ref.  Amp Ref. Segments Distance    ms ms V V  cm  R Median - Orthodromic (Dig II, Mid palm)     Dig II Wrist 4.7 ?3.4 6 ?10 Dig II - Wrist 13  L Median - Orthodromic (Dig II, Mid palm)     Dig II Wrist 4.2 ?3.4 5 ?10 Dig II - Wrist 13  R Ulnar - Orthodromic, (Dig V, Mid palm)     Dig V Wrist 2.4 ?3.1 6 ?5 Dig V - Wrist 11  L Ulnar - Orthodromic, (Dig V, Mid palm)     Dig V Wrist NR ?3.1 NR ?5 Dig V - Wrist 42             F  Wave    Nerve F Lat Ref.   ms ms  R Ulnar - ADM 35.0 ?32.0  L Ulnar - ADM 32.8 ?32.0         EMG Summary Table    Spontaneous MUAP Recruitment  Muscle IA Fib PSW Fasc Other Amp Dur. Poly Pattern  R. First dorsal interosseous Normal None None None _______ Normal Normal Normal Reduced  R. Pronator teres Normal None None None _______ Normal Normal Normal Reduced  R. Biceps brachii Normal None None None _______ Normal Normal Normal Reduced  R. Deltoid Normal  None None None _______ Normal Normal Normal Reduced  R. Triceps brachii Normal None None None _______ Normal Normal Normal Reduced  R. Brachioradialis Normal None None None _______ Normal Normal Normal Reduced  R. Extensor digitorum communis Normal None None None _______ Normal Normal Normal Reduced  R. Abductor pollicis brevis Normal None None None _______ Normal Normal Normal Reduced  L. First dorsal interosseous Normal None None None _______ Normal Normal Normal Reduced  L. Abductor digiti minimi (manus) Increased 1+ None None _______ Increased Increased 1+ Reduced  L. Pronator teres Normal None None None _______ Normal Normal Normal Reduced  L. Biceps brachii Normal None None None _______ Normal Normal Normal Reduced  L. Deltoid Normal None None None _______ Normal Normal Normal Reduced  L. Triceps brachii Normal None None None _______ Normal Normal Normal Reduced  L. Extensor digitorum communis Normal None None None _______ Normal Normal Normal Reduced  L. Brachioradialis Normal None None None _______ Normal Normal Normal Reduced  R. Cervical paraspinals Normal None None None _______ Normal Increased 1+ Normal  L. Cervical paraspinals Normal None None None _______ Normal Increased 1+ Normal

## 2021-11-05 NOTE — Progress Notes (Signed)
EMG report is under procedure 

## 2021-11-07 ENCOUNTER — Telehealth: Payer: Self-pay

## 2021-11-07 NOTE — Telephone Encounter (Signed)
-----   Message from Star Age, MD sent at 11/06/2021  7:41 AM EDT ----- Please notify patient that his recent EMG and nerve conduction velocity test through our office which is the electrical nerve and muscle test indicated carpal tunnel syndrome of both wrists.  Findings are in the moderate range.  He also has evidence of pinched nerve type findings that is coming from the neck from compression of nerve roots.  I would recommend that he follow-up with his referring physician Dr. Eulas Post.  For now, for his carpal tunnel he can start using wrist braces which are over-the-counter, he can find a Velcro brace with a metal support plate in it, he can also ask his pharmacist for advice, it may help to use these braces at least overnight.   Please also send the test results to Dr. Lorenza Cambridge.

## 2021-11-07 NOTE — Telephone Encounter (Signed)
I called patient.  I discussed his NCV/EMG results and recommendations.  Patient will follow-up with Dr. Eulas Post and is aware these results will be sent to him.  Patient will start using over-the-counter wrist braces at least overnight.  Patient verbalized understanding of results and recommendations.  Patient had no further questions or concerns at this time.

## 2021-11-14 DIAGNOSIS — M5481 Occipital neuralgia: Secondary | ICD-10-CM | POA: Diagnosis not present

## 2021-11-14 DIAGNOSIS — G894 Chronic pain syndrome: Secondary | ICD-10-CM | POA: Diagnosis not present

## 2021-11-19 DIAGNOSIS — E1142 Type 2 diabetes mellitus with diabetic polyneuropathy: Secondary | ICD-10-CM | POA: Diagnosis not present

## 2021-11-19 DIAGNOSIS — I10 Essential (primary) hypertension: Secondary | ICD-10-CM | POA: Diagnosis not present

## 2021-11-27 ENCOUNTER — Encounter: Payer: Self-pay | Admitting: Urology

## 2021-11-27 ENCOUNTER — Ambulatory Visit (INDEPENDENT_AMBULATORY_CARE_PROVIDER_SITE_OTHER): Payer: Medicare HMO | Admitting: Urology

## 2021-11-27 VITALS — BP 137/82 | HR 102

## 2021-11-27 DIAGNOSIS — N401 Enlarged prostate with lower urinary tract symptoms: Secondary | ICD-10-CM

## 2021-11-27 DIAGNOSIS — R339 Retention of urine, unspecified: Secondary | ICD-10-CM | POA: Diagnosis not present

## 2021-11-27 DIAGNOSIS — R351 Nocturia: Secondary | ICD-10-CM

## 2021-11-27 DIAGNOSIS — N138 Other obstructive and reflux uropathy: Secondary | ICD-10-CM | POA: Diagnosis not present

## 2021-11-27 DIAGNOSIS — N5201 Erectile dysfunction due to arterial insufficiency: Secondary | ICD-10-CM | POA: Diagnosis not present

## 2021-11-27 LAB — BLADDER SCAN AMB NON-IMAGING: Scan Result: 158

## 2021-11-27 MED ORDER — SILODOSIN 8 MG PO CAPS: 8.0000 mg | ORAL_CAPSULE | Freq: Every day | ORAL | 11 refills | Status: DC

## 2021-11-27 MED ORDER — TADALAFIL 5 MG PO TABS: 5.0000 mg | ORAL_TABLET | Freq: Every day | ORAL | 11 refills | Status: DC | PRN

## 2021-11-27 NOTE — Progress Notes (Signed)
post void residual=158

## 2021-11-27 NOTE — Progress Notes (Signed)
11/27/2021 2:19 PM   [name] 1940-02-10 [id number]  Referring provider: Carrolyn Meiers, MD [place],  [place] [address]  Followup BPH and erectile dysfunction   HPI: [name] is a 81yo here for followup for BPH and erectile dysfunction. IPSS 12 QOL 3 on uroxatral '10mg'$  daily. He has nocturia 4x which is a medium volume. Urine stream strong. PVR 158cc.  He continue to have issues getting and maintaining an erection. He did not get the rx for tadalafil   PMH: Past Medical History:  Diagnosis Date   Diabetes mellitus    A1c of 7 -12/2009   DJD (degenerative joint disease)    left knee,cervical spine,lumbosacral spine   DVT (deep venous thrombosis) ([place]) 12/06/2009   left lower extremity  following left TKA   Hyperlipidemia    Hypertension    Obesity    Skin cancer    both arms    Surgical History: Past Surgical History:  Procedure Laterality Date   CERVICAL DISCECTOMY     and fusion   COLONOSCOPY  08/15/2011   Procedure: COLONOSCOPY;  Surgeon: Daneil Dolin, MD;  [facility];  Service: Endoscopy;  Laterality: N/A;  11:15 AM   COLONOSCOPY W/ POLYPECTOMY  2003   TOTAL KNEE ARTHROPLASTY  2011   TOTAL KNEE ARTHROPLASTY  03/2008   TOTAL SHOULDER ARTHROPLASTY  05/2009   right     Home Medications:  Allergies as of 11/27/2021   No Known Allergies      Medication List        Accurate as of November 27, 2021  2:19 PM. If you have any questions, ask your nurse or doctor.          alfuzosin 10 MG 24 hr tablet Commonly known as: UROXATRAL Take 1 tablet (10 mg total) by mouth at bedtime.   baclofen 10 MG tablet Commonly known as: LIORESAL Take 1 tablet by mouth daily.   citalopram 10 MG tablet Commonly known as: CELEXA Take by mouth.   gabapentin 800 MG tablet Commonly known as: NEURONTIN Take 800 mg by mouth 3 (three) times daily.   glipiZIDE 5 MG tablet Commonly known as: GLUCOTROL Take 5 mg by mouth 2  (two) times daily before a meal. Pt takes one tablet in the AM   HYDROcodone-acetaminophen 10-325 MG tablet Commonly known as: NORCO Take 1 tablet by mouth 4 (four) times daily as needed.   ipratropium-albuterol 0.5-2.5 (3) MG/3ML Soln Commonly known as: DUONEB Take 0.5 mLs by nebulization 4 (four) times daily as needed.   losartan-hydrochlorothiazide 50-12.5 MG tablet Commonly known as: HYZAAR Take 1 tablet by mouth daily.   meclizine 25 MG tablet Commonly known as: ANTIVERT Take 25 mg by mouth 2 (two) times daily as needed for dizziness.   metFORMIN 500 MG tablet Commonly known as: GLUCOPHAGE Take 1 tablet (500 mg total) by mouth 2 (two) times daily with a meal. Takes one tablet in the AM   metoprolol succinate 50 MG 24 hr tablet Commonly known as: TOPROL-XL Take 50 mg by mouth daily.   mirabegron ER 25 MG Tb24 tablet Commonly known as: MYRBETRIQ Take 1 tablet (25 mg total) by mouth daily.   montelukast 10 MG tablet Commonly known as: SINGULAIR   multivitamin with minerals tablet Take 1 tablet by mouth daily.   NON FORMULARY [facility]  Peripheral Neuropathy Cream- Bupivacaine 1%, Doxepin 3%, Gabapentin 6%, Pentoxifylline 3%, Topiramate 1% Apply 1-2 grams to affected area 3-4  times daily Qty. 120 gm 3 refills   predniSONE 50 MG tablet Commonly known as: DELTASONE Take 1 tablet (50 mg total) by mouth daily with breakfast.   silodosin 8 MG Caps capsule Commonly known as: RAPAFLO Take 1 capsule (8 mg total) by mouth at bedtime.   simvastatin 40 MG tablet Commonly known as: ZOCOR Take 40 mg by mouth at bedtime.   tadalafil 5 MG tablet Commonly known as: CIALIS Take 1 tablet (5 mg total) by mouth daily as needed for erectile dysfunction.   Tradjenta 5 MG Tabs tablet Generic drug: linagliptin Take 5 mg by mouth daily.   Vitamin D3 50 MCG (2000 UT) Tabs Take 1 tablet by mouth daily.        Allergies: No Known Allergies  Family  History: Family History  Problem Relation Age of Onset   Lung cancer Brother        smoked   Tremor Neg Hx     Social History:  reports that he has never smoked. He has never used smokeless tobacco. He reports current alcohol use of about 12.0 - 24.0 standard drinks of alcohol per week. He reports that he does not use drugs.  ROS: All other review of systems were reviewed and are negative except what is noted above in HPI  Physical Exam: BP 137/82   Pulse (!) 102   Constitutional:  Alert and oriented, No acute distress. HEENT: [place] AT, moist mucus membranes.  Trachea midline, no masses. Cardiovascular: No clubbing, cyanosis, or edema. Respiratory: Normal respiratory effort, no increased work of breathing. GI: Abdomen is soft, nontender, nondistended, no abdominal masses GU: No CVA tenderness.  Lymph: No cervical or inguinal lymphadenopathy. Skin: No rashes, bruises or suspicious lesions. Neurologic: Grossly intact, no focal deficits, moving all 4 extremities. Psychiatric: Normal mood and affect.  Laboratory Data: Lab Results  Component Value Date   WBC 13.7 (H) 01/04/2020   HGB 14.6 01/04/2020   HCT 42.2 01/04/2020   MCV 87.9 01/04/2020   PLT 307 01/04/2020    Lab Results  Component Value Date   CREATININE 0.99 06/20/2020    No results found for: "PSA"  No results found for: "TESTOSTERONE"  Lab Results  Component Value Date   HGBA1C 8.0 (H) 01/02/2020    Urinalysis    Component Value Date/Time   COLORURINE YELLOW 12/23/2009 0151   APPEARANCEUR Clear 07/14/2021 1005   LABSPEC 1.020 12/23/2009 0151   PHURINE 7.5 12/23/2009 0151   GLUCOSEU Negative 07/14/2021 1005   HGBUR NEGATIVE 12/23/2009 0151   BILIRUBINUR Negative 07/14/2021 1005   KETONESUR 40 (A) 12/23/2009 0151   PROTEINUR Negative 07/14/2021 1005   PROTEINUR NEGATIVE 12/23/2009 0151   UROBILINOGEN 1.0 12/23/2009 0151   NITRITE Negative 07/14/2021 1005   NITRITE NEGATIVE 12/23/2009 0151    LEUKOCYTESUR Negative 07/14/2021 1005    Lab Results  Component Value Date   LABMICR Comment 07/14/2021   WBCUA None seen 01/23/2021   LABEPIT None seen 01/23/2021   BACTERIA None seen 01/23/2021    Pertinent Imaging:  No results found for this or any previous visit.  No results found for this or any previous visit.  No results found for this or any previous visit.  No results found for this or any previous visit.  No results found for this or any previous visit.  No valid procedures specified. No results found for this or any previous visit.  No results found for this or any previous visit.   Assessment & Plan:  1. Incomplete bladder emptying -we will trial rapaflo '8mg'$   - BLADDER SCAN AMB NON-IMAGING  2. Erectile dysfunction due to arterial insufficiency -tadalafil '5mg'$  prn  3. BPH with urinary obstruction -rapaflo '8mg'$  daily   No follow-ups on file.  Nicolette Bang, MD  [facility] [place]

## 2021-11-27 NOTE — Patient Instructions (Signed)

## 2021-12-01 DIAGNOSIS — L989 Disorder of the skin and subcutaneous tissue, unspecified: Secondary | ICD-10-CM | POA: Diagnosis not present

## 2021-12-01 DIAGNOSIS — D0461 Carcinoma in situ of skin of right upper limb, including shoulder: Secondary | ICD-10-CM | POA: Diagnosis not present

## 2021-12-06 ENCOUNTER — Other Ambulatory Visit: Payer: Self-pay

## 2021-12-06 ENCOUNTER — Telehealth: Payer: Self-pay

## 2021-12-06 DIAGNOSIS — N138 Other obstructive and reflux uropathy: Secondary | ICD-10-CM

## 2021-12-06 MED ORDER — ALFUZOSIN HCL ER 10 MG PO TB24: 10.0000 mg | ORAL_TABLET | Freq: Every day | ORAL | 11 refills | Status: DC

## 2021-12-06 NOTE — Telephone Encounter (Signed)
Patient states rapaflo is to expensive. Please advise on a less expensive option.

## 2021-12-06 NOTE — Telephone Encounter (Signed)
Patient called and made aware of rx sent to [facility].

## 2021-12-25 ENCOUNTER — Ambulatory Visit (HOSPITAL_COMMUNITY): Payer: Medicare HMO | Attending: Neurosurgery | Admitting: Physical Therapy

## 2021-12-25 ENCOUNTER — Encounter (HOSPITAL_COMMUNITY): Payer: Self-pay | Admitting: Physical Therapy

## 2021-12-25 DIAGNOSIS — G5603 Carpal tunnel syndrome, bilateral upper limbs: Secondary | ICD-10-CM | POA: Diagnosis not present

## 2021-12-25 DIAGNOSIS — R000 Tachycardia, unspecified: Secondary | ICD-10-CM | POA: Diagnosis not present

## 2021-12-25 DIAGNOSIS — I10 Essential (primary) hypertension: Secondary | ICD-10-CM | POA: Diagnosis not present

## 2021-12-25 DIAGNOSIS — M47812 Spondylosis without myelopathy or radiculopathy, cervical region: Secondary | ICD-10-CM | POA: Diagnosis not present

## 2021-12-25 DIAGNOSIS — Z7984 Long term (current) use of oral hypoglycemic drugs: Secondary | ICD-10-CM | POA: Diagnosis not present

## 2021-12-25 DIAGNOSIS — Z23 Encounter for immunization: Secondary | ICD-10-CM | POA: Diagnosis not present

## 2021-12-25 DIAGNOSIS — M542 Cervicalgia: Secondary | ICD-10-CM | POA: Diagnosis not present

## 2021-12-25 DIAGNOSIS — R29898 Other symptoms and signs involving the musculoskeletal system: Secondary | ICD-10-CM | POA: Diagnosis not present

## 2021-12-25 DIAGNOSIS — E1140 Type 2 diabetes mellitus with diabetic neuropathy, unspecified: Secondary | ICD-10-CM | POA: Diagnosis not present

## 2021-12-25 DIAGNOSIS — M1990 Unspecified osteoarthritis, unspecified site: Secondary | ICD-10-CM | POA: Diagnosis not present

## 2021-12-25 DIAGNOSIS — E1142 Type 2 diabetes mellitus with diabetic polyneuropathy: Secondary | ICD-10-CM | POA: Diagnosis not present

## 2021-12-25 NOTE — Therapy (Signed)
OUTPATIENT PHYSICAL THERAPY CERVICAL EVALUATION   Patient Name: [name] MRN: [id number] DOB:[birth year], 81 y.o., male Today's Date: 12/25/2021  END OF SESSION:  PT End of Session - 12/25/21 1341     Visit Number 1    Number of Visits 8    Date for PT Re-Evaluation 01/22/22    Authorization Type [employer]    Authorization Time Period 8 visits requested - check auth    PT Start Time 1345    PT Stop Time 1424    PT Time Calculation (min) 39 min    Activity Tolerance Patient tolerated treatment well    Behavior During Therapy [facility] for tasks assessed/performed             Past Medical History:  Diagnosis Date   Diabetes mellitus    A1c of 7 -12/2009   DJD (degenerative joint disease)    left knee,cervical spine,lumbosacral spine   DVT (deep venous thrombosis) ([place]) 12/06/2009   left lower extremity  following left TKA   Hyperlipidemia    Hypertension    Obesity    Skin cancer    both arms   Past Surgical History:  Procedure Laterality Date   CERVICAL DISCECTOMY     and fusion   COLONOSCOPY  08/15/2011   Procedure: COLONOSCOPY;  Surgeon: Daneil Dolin, MD;  [facility];  Service: Endoscopy;  Laterality: N/A;  11:15 AM   COLONOSCOPY W/ POLYPECTOMY  2003   TOTAL KNEE ARTHROPLASTY  2011   TOTAL KNEE ARTHROPLASTY  03/2008   TOTAL SHOULDER ARTHROPLASTY  05/2009   right    Patient Active Problem List   Diagnosis Date Noted   Chronic neck pain 11/03/2021   Weakness of left hand 11/03/2021   Paresthesia of both hands 11/03/2021   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 11/03/2021   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/02/2020   Dysuria 05/04/2020   Right inguinal pain 05/04/2020   Plantar flexed metatarsal bone of left foot 04/15/2020   Plantar flexed metatarsal bone of right foot 04/15/2020   Pneumonia due to COVID-19 virus 01/02/2020   Acute respiratory failure with hypoxia ([place]) 01/02/2020   BPH with urinary obstruction 03/06/2019    Nocturia 03/06/2019   Neck pain 01/07/2019   Body mass index (BMI) 27.0-27.9, adult 10/29/2018   Tremor 10/08/2018   DDD (degenerative disc disease), cervical 07/23/2017   Lumbar radiculopathy 05/09/2017   Chronic pain syndrome 04/03/2017   Brachial radiculitis 01/13/2013   Peripheral vascular disease ([place]) 06/12/2010   Hypertension    Diabetes mellitus ([place])    DJD (degenerative joint disease)    HYPERLIPIDEMIA 02/15/2010   OBESITY 02/15/2010   DVT (deep venous thrombosis) ([place]) 12/06/2009    PCP: Carrolyn Meiers  REFERRING PROVIDER: Orvilla Cornwall, MD  REFERRING DIAG: chronic neck pain likely multifactorial including cervical myofascial pain and cervical spondylosis  THERAPY DIAG:  Cervicalgia  Other symptoms and signs involving the musculoskeletal system  Rationale for Evaluation and Treatment: Rehabilitation  ONSET DATE: 3 years   SUBJECTIVE:  SUBJECTIVE STATEMENT: Patient states pain and stiffness with turning head. Pain in the back of the neck with laying on pillow. During the day he has pain in the back/side of neck/ head and goes to R shoulder. Symptoms began about 3 years ago with insidious onset. He was told he has a pinched nerve in his neck. Patient states hand numbness mostly at night when laying down.   PERTINENT HISTORY:  Hx DMII, HLD, HTN, neuropathy, chronic neck pain s/p C3-4 fusion (1996, Dr. Harl Bowie in [facility]), bilateral TKA, right shoulder replacement  PAIN:  Are you having pain? No and Yes: NPRS scale: 0 at rest currently/10 Pain location: neck Pain description: sharp, stiff Aggravating factors: rotation Relieving factors: none  PRECAUTIONS: None  WEIGHT BEARING RESTRICTIONS: No  FALLS:  Has patient fallen in last 6 months?  No  LIVING ENVIRONMENT: Lives with: lives alone Lives in: House/apartment Stairs: No Has following equipment at home: None  OCCUPATION: Retired  PLOF: Independent  PATIENT GOALS: decrease symptoms  NEXT MD VISIT: 4 months  OBJECTIVE:   DIAGNOSTIC FINDINGS:  MRI 10/07/21 IMPRESSION: 1. 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 Finding could contribute to underlying neck pain. 2. Prior fusion at C3-4 without residual stenosis. 3. Degenerative spondylosis at C4-5 with resultant mild to moderate spinal stenosis, with moderate left and severe right C5 foraminal narrowing. Probable subtle small foci of chronic myelomalacia involving the bilateral cord at this level, likely stable in retrospect. 4. Advanced degenerative spondylosis at C5-6 and C6-7 with resultant mild to moderate spinal stenosis, with severe bilateral C6 and C7 foraminal stenosis. 5. Severe left-sided facet arthrosis at C7-T1 with resultant severe left C8 foraminal stenosis. 6. Loss of normal flow void within the right vertebral artery, which could be related to slow flow and/or occlusion, new from prior. 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  PATIENT SURVEYS:  FOTO 66% function  COGNITION: Overall cognitive status: Within functional limits for tasks assessed  SENSATION: WFL  POSTURE: rounded shoulders, forward head, and increased thoracic kyphosis  PALPATION: Grossly tender throughout bilateral cervical paraspinals, UT   CERVICAL ROM:   Active ROM A/PROM  eval  Flexion 0% limited  Extension 75% limited *  Right lateral flexion 50% limited*  Left lateral flexion 75% limited*  Right rotation 50 % limited  Left rotation 75% limited*   (Blank rows = not tested) *=pain  UPPER EXTREMITY ROM: WFL  for tasks assessed  Active ROM Right eval Left eval  Shoulder flexion    Shoulder  extension    Shoulder abduction    Shoulder adduction    Shoulder extension    Shoulder internal rotation    Shoulder external rotation    Elbow flexion    Elbow extension    Wrist flexion    Wrist extension    Wrist ulnar deviation    Wrist radial deviation    Wrist pronation    Wrist supination     (Blank rows = not tested)  UPPER EXTREMITY MMT:  MMT Right eval Left eval  Shoulder flexion 4- 4+  Shoulder extension    Shoulder abduction 4 4+  Shoulder adduction    Shoulder extension    Shoulder internal rotation    Shoulder external rotation    Middle trapezius    Lower trapezius    Elbow flexion 5 5  Elbow extension 5 5  Wrist flexion 5 5  Wrist extension 5  5  Wrist ulnar deviation    Wrist radial deviation    Wrist pronation    Wrist supination    Grip strength WFL WFL   (Blank rows = not tested)  CERVICAL SPECIAL TESTS:  Distraction test: Negative    TODAY'S TREATMENT:                                                                                                                              DATE:  12/25/21 Supine cervical retractions 2 x 10  Supine scapular retractions 2 x 10    PATIENT EDUCATION:  Education details: Patient educated on exam findings, POC, scope of PT, HEP, and posture. Person educated: Patient Education method: Explanation, Demonstration, and Handouts Education comprehension: verbalized understanding, returned demonstration, verbal cues required, and tactile cues required  HOME EXERCISE PROGRAM: Access Code: [email or web address]  Date: 12/25/2021 - Supine Chin Tuck  - 2 x daily - 7 x weekly - 2 sets - 10 reps - Supine Scapular Retraction  - 2 x daily - 7 x weekly - 2 sets - 10 reps  ASSESSMENT:  CLINICAL IMPRESSION: Patient a 81 y.o. y.o. male who was seen today for physical therapy evaluation and treatment for chronic neck pain. Patient presents with pain limited deficits in cervical spine strength, ROM, endurance, activity tolerance,  and functional mobility with ADL. Patient is having to modify and restrict ADL as indicated by outcome measure score as well as subjective information and objective measures which is affecting overall participation. Patient will benefit from skilled physical therapy in order to improve function and reduce impairment.   OBJECTIVE IMPAIRMENTS: decreased activity tolerance, decreased mobility, decreased ROM, decreased strength, hypomobility, impaired flexibility, postural dysfunction, and pain.   ACTIVITY LIMITATIONS: carrying, lifting, bending, reach over head, and caring for others  PARTICIPATION LIMITATIONS: meal prep, cleaning, laundry, shopping, community activity, and yard work  PERSONAL FACTORS: Time since onset of injury/illness/exacerbation and 3+ comorbidities: DMII, HLD, HTN, neuropathy, chronic neck pain s/p C3-4 fusion (1996, Dr. Harl [facility] in [facility]), bilateral TKA, right shoulder replacement  are also affecting patient's functional outcome.   REHAB POTENTIAL: Good  CLINICAL DECISION MAKING: Evolving/moderate complexity  EVALUATION COMPLEXITY: Moderate   GOALS: Goals reviewed with patient? Yes  SHORT TERM GOALS: Target date: 01/08/2022   Patient will be independent with HEP in order to improve functional outcomes. Baseline:  Goal status: INITIAL  2.  Patient will report at least 25% improvement in symptoms for improved quality of life. Baseline:  Goal status: INITIAL    LONG TERM GOALS: Target date: 01/22/2022    Patient will report at least 75% improvement in symptoms for improved quality of life. Baseline:  Goal status: INITIAL  2.  Patient will improve FOTO score by at least 5 points in order to indicate improved tolerance to activity. Baseline: 66% function Goal status: INITIAL  3.  Patient will demonstrate at least 25% improvement in cervical ROM in all restricted planes for improved  ability to move head while driving. Baseline: see above Goal status:  INITIAL  4.  Patient will report decreased neck and hand symptoms with laying at night to improve sleep quality.  Baseline: impaired Goal status: INITIAL     PLAN:  PT FREQUENCY: 2x/week  PT DURATION: 4 weeks  PLANNED INTERVENTIONS: Therapeutic exercises, Therapeutic activity, Neuromuscular re-education, Balance training, Gait training, Patient/Family education, Joint manipulation, Joint mobilization, Stair training, Orthotic/Fit training, DME instructions, Aquatic Therapy, Dry Needling, Electrical stimulation, Spinal manipulation, Spinal mobilization, Cryotherapy, Moist heat, Compression bandaging, scar mobilization, Splintting, Taping, Traction, Ultrasound, Ionotophoresis '4mg'$ /ml Dexamethasone, and Manual therapy   PLAN FOR NEXT SESSION: f/u with HEP, cervical mobility, postural strength, further assess if hand symptoms are related to neck in future sessions   Mearl Latin, PT 12/25/2021, 2:35 PM

## 2022-01-01 ENCOUNTER — Ambulatory Visit: Payer: Medicare HMO | Admitting: Urology

## 2022-01-01 DIAGNOSIS — R339 Retention of urine, unspecified: Secondary | ICD-10-CM

## 2022-01-08 ENCOUNTER — Ambulatory Visit (HOSPITAL_COMMUNITY): Payer: Medicare HMO | Attending: Neurosurgery | Admitting: Physical Therapy

## 2022-01-08 DIAGNOSIS — M542 Cervicalgia: Secondary | ICD-10-CM | POA: Diagnosis not present

## 2022-01-08 DIAGNOSIS — R29898 Other symptoms and signs involving the musculoskeletal system: Secondary | ICD-10-CM | POA: Diagnosis not present

## 2022-01-08 NOTE — Therapy (Signed)
OUTPATIENT PHYSICAL THERAPY TREATMENT   Patient Name: [name] MRN: [id number] DOB:[birth year], 81 y.o., male Today's Date: 01/08/2022  END OF SESSION:  PT End of Session - 01/08/22 1119     Visit Number 2    Number of Visits 8    Date for PT Re-Evaluation 01/22/22    Authorization Type [employer]    Authorization Time Period 8 visits approved 11/20-12/18    Authorization - Visit Number 2    Authorization - Number of Visits 8    Progress Note Due on Visit 8    PT Start Time 1117    PT Stop Time 1155    PT Time Calculation (min) 38 min    Activity Tolerance Patient tolerated treatment well    Behavior During Therapy [facility] for tasks assessed/performed             Past Medical History:  Diagnosis Date   Diabetes mellitus    A1c of 7 -12/2009   DJD (degenerative joint disease)    left knee,cervical spine,lumbosacral spine   DVT (deep venous thrombosis) ([place]) 12/06/2009   left lower extremity  following left TKA   Hyperlipidemia    Hypertension    Obesity    Skin cancer    both arms   Past Surgical History:  Procedure Laterality Date   CERVICAL DISCECTOMY     and fusion   COLONOSCOPY  08/15/2011   Procedure: COLONOSCOPY;  Surgeon: Daneil Dolin, MD;  [facility];  Service: Endoscopy;  Laterality: N/A;  11:15 AM   COLONOSCOPY W/ POLYPECTOMY  2003   TOTAL KNEE ARTHROPLASTY  2011   TOTAL KNEE ARTHROPLASTY  03/2008   TOTAL SHOULDER ARTHROPLASTY  05/2009   right    Patient Active Problem List   Diagnosis Date Noted   Chronic neck pain 11/03/2021   Weakness of left hand 11/03/2021   Paresthesia of both hands 11/03/2021   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 11/03/2021   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/02/2020   Dysuria 05/04/2020   Right inguinal pain 05/04/2020   Plantar flexed metatarsal bone of left foot 04/15/2020   Plantar flexed metatarsal bone of right foot 04/15/2020   Pneumonia due to COVID-19 virus 01/02/2020   Acute  respiratory failure with hypoxia ([place]) 01/02/2020   BPH with urinary obstruction 03/06/2019   Nocturia 03/06/2019   Neck pain 01/07/2019   Body mass index (BMI) 27.0-27.9, adult 10/29/2018   Tremor 10/08/2018   DDD (degenerative disc disease), cervical 07/23/2017   Lumbar radiculopathy 05/09/2017   Chronic pain syndrome 04/03/2017   Brachial radiculitis 01/13/2013   Peripheral vascular disease ([place]) 06/12/2010   Hypertension    Diabetes mellitus ([place])    DJD (degenerative joint disease)    HYPERLIPIDEMIA 02/15/2010   OBESITY 02/15/2010   DVT (deep venous thrombosis) ([place]) 12/06/2009    PCP: Carrolyn Meiers  REFERRING PROVIDER: Orvilla Cornwall, MD  REFERRING DIAG: chronic neck pain likely multifactorial including cervical myofascial pain and cervical spondylosis  THERAPY DIAG:  Cervicalgia  Other symptoms and signs involving the musculoskeletal system  Rationale for Evaluation and Treatment: Rehabilitation  ONSET DATE: 3 years   SUBJECTIVE:  SUBJECTIVE STATEMENT: Pt reports compliance with HEP.  Currently 7/10 in Rt cervical area with rotation.  States it bothers him most at night while sleeping.  States he has tried all sorts of pillows.  Evaluation: Patient states pain and stiffness with turning head. Pain in the back of the neck with laying on pillow. During the day he has pain in the back/side of neck/ head and goes to R shoulder. Symptoms began about 3 years ago with insidious onset. He was told he has a pinched nerve in his neck. Patient states hand numbness mostly at night when laying down.   PERTINENT HISTORY:  Hx DMII, HLD, HTN, neuropathy, chronic neck pain s/p C3-4 fusion (1996, Dr. Harl Bowie in [facility]), bilateral TKA, right shoulder replacement  PAIN:   Are you having pain? No and Yes: NPRS scale: 0 at rest currently 7/10 Pain location: neck Pain description: sharp, stiff Aggravating factors: rotation Relieving factors: none  PRECAUTIONS: None  WEIGHT BEARING RESTRICTIONS: No  FALLS:  Has patient fallen in last 6 months? No  LIVING ENVIRONMENT: Lives with: lives alone Lives in: House/apartment Stairs: No Has following equipment at home: None  OCCUPATION: Retired  PLOF: Independent  PATIENT GOALS: decrease symptoms  NEXT MD VISIT: 4 months  OBJECTIVE:   DIAGNOSTIC FINDINGS:  MRI 10/07/21 IMPRESSION: 1. 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 Finding could contribute to underlying neck pain. 2. Prior fusion at C3-4 without residual stenosis. 3. Degenerative spondylosis at C4-5 with resultant mild to moderate spinal stenosis, with moderate left and severe right C5 foraminal narrowing. Probable subtle small foci of chronic myelomalacia involving the bilateral cord at this level, likely stable in retrospect. 4. Advanced degenerative spondylosis at C5-6 and C6-7 with resultant mild to moderate spinal stenosis, with severe bilateral C6 and C7 foraminal stenosis. 5. Severe left-sided facet arthrosis at C7-T1 with resultant severe left C8 foraminal stenosis. 6. Loss of normal flow void within the right vertebral artery, which could be related to slow flow and/or occlusion, new from prior. 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  PATIENT SURVEYS:  FOTO 66% function  COGNITION: Overall cognitive status: Within functional limits for tasks assessed  SENSATION: WFL  POSTURE: rounded shoulders, forward head, and increased thoracic kyphosis  PALPATION: Grossly tender throughout bilateral cervical paraspinals, UT   CERVICAL ROM:   Active ROM A/PROM  eval  Flexion 0% limited  Extension 75% limited *   Right lateral flexion 50% limited*  Left lateral flexion 75% limited*  Right rotation 50 % limited  Left rotation 75% limited*   (Blank rows = not tested) *=pain  UPPER EXTREMITY ROM: WFL  for tasks assessed   UPPER EXTREMITY MMT:  MMT Right eval Left eval  Shoulder flexion 4- 4+  Shoulder extension    Shoulder abduction 4 4+  Shoulder adduction    Shoulder extension    Shoulder internal rotation    Shoulder external rotation    Middle trapezius    Lower trapezius    Elbow flexion 5 5  Elbow extension 5 5  Wrist flexion 5 5  Wrist extension 5 5  Wrist ulnar deviation    Wrist radial deviation    Wrist pronation    Wrist supination    Grip strength WFL WFL   (Blank rows = not tested)  CERVICAL SPECIAL TESTS:  Distraction test: Negative  TODAY'S TREATMENT:  DATE:  01/08/22 Seated cervical excursions 3D 5X each direction  Cervical retractions 2X10  Scapular retractions 2X10 Standing:  wall arches 10X  UE flexion against wall 10X  Wall push ups 10X  Corner stretch 3X20" Manual :  seated to bil upper traps, scaps and scalenes  12/25/21 Supine cervical retractions 2 x 10  Supine scapular retractions 2 x 10    PATIENT EDUCATION:  Education details: Patient educated on exam findings, POC, scope of PT, HEP, and posture. Person educated: Patient Education method: Explanation, Demonstration, and Handouts Education comprehension: verbalized understanding, returned demonstration, verbal cues required, and tactile cues required  HOME EXERCISE PROGRAM: Access Code: [email or web address]  Date: 12/25/2021 - Supine Chin Tuck  - 2 x daily - 7 x weekly - 2 sets - 10 reps - Supine Scapular Retraction  - 2 x daily - 7 x weekly - 2 sets - 10 reps  ASSESSMENT:  CLINICAL IMPRESSION: Reviewed goals and POC moving forward.  Began with cervical motions with noted  impairment, especially in lateral flexion and rotation with minimal movements completed. Worked on exercises in seated upright posturing.Pt with cues to remain in upright as tends to lean back into chair.  Progressed to standing strengthening as well as stretches.  Completed with manual to cervical mm.  General tightness but no spasms present.  Pt with questions regarding therapy for wrists and instructed to f/U with MD as we did not get any orders.  Patient will continue to benefit from skilled physical therapy in order to improve function and reduce impairment.   OBJECTIVE IMPAIRMENTS: decreased activity tolerance, decreased mobility, decreased ROM, decreased strength, hypomobility, impaired flexibility, postural dysfunction, and pain.   ACTIVITY LIMITATIONS: carrying, lifting, bending, reach over head, and caring for others  PARTICIPATION LIMITATIONS: meal prep, cleaning, laundry, shopping, community activity, and yard work  PERSONAL FACTORS: Time since onset of injury/illness/exacerbation and 3+ comorbidities: DMII, HLD, HTN, neuropathy, chronic neck pain s/p C3-4 fusion (1996, Dr. Harl Bowie in [facility]), bilateral TKA, right shoulder replacement  are also affecting patient's functional outcome.   REHAB POTENTIAL: Good  CLINICAL DECISION MAKING: Evolving/moderate complexity  EVALUATION COMPLEXITY: Moderate   GOALS: Goals reviewed with patient? Yes  SHORT TERM GOALS: Target date: 01/08/2022   Patient will be independent with HEP in order to improve functional outcomes. Baseline:  Goal status: IN PROGRESS  2.  Patient will report at least 25% improvement in symptoms for improved quality of life. Baseline:  Goal status: IN PROGRESS    LONG TERM GOALS: Target date: 01/22/2022    Patient will report at least 75% improvement in symptoms for improved quality of life. Baseline:  Goal status: IN PROGRESS  2.  Patient will improve FOTO score by at least 5 points in order to  indicate improved tolerance to activity. Baseline: 66% function Goal status: IN PROGRESS  3.  Patient will demonstrate at least 25% improvement in cervical ROM in all restricted planes for improved ability to move head while driving. Baseline: see above Goal status: IN PROGRESS  4.  Patient will report decreased neck and hand symptoms with laying at night to improve sleep quality.  Baseline: impaired Goal status: IN PROGRESS     PLAN:  PT FREQUENCY: 2x/week  PT DURATION: 4 weeks  PLANNED INTERVENTIONS: Therapeutic exercises, Therapeutic activity, Neuromuscular re-education, Balance training, Gait training, Patient/Family education, Joint manipulation, Joint mobilization, Stair training, Orthotic/Fit training, DME instructions, Aquatic Therapy, Dry Needling, Electrical stimulation, Spinal manipulation, Spinal mobilization, Cryotherapy, Moist heat, Compression bandaging, scar mobilization,  Splintting, Taping, Traction, Ultrasound, Ionotophoresis '4mg'$ /ml Dexamethasone, and Manual therapy   PLAN FOR NEXT SESSION: continue to progress postural mobility, strength, and function.    Teena Irani, PTA/CLT [place] Ph: [phone]  Teena Irani, PTA 01/08/2022, 11:21 AM

## 2022-01-10 ENCOUNTER — Encounter (HOSPITAL_COMMUNITY): Payer: Self-pay

## 2022-01-10 ENCOUNTER — Other Ambulatory Visit: Payer: Self-pay

## 2022-01-10 ENCOUNTER — Ambulatory Visit (HOSPITAL_COMMUNITY): Payer: Medicare HMO | Admitting: Physical Therapy

## 2022-01-10 DIAGNOSIS — N138 Other obstructive and reflux uropathy: Secondary | ICD-10-CM

## 2022-01-10 MED ORDER — ALFUZOSIN HCL ER 10 MG PO TB24: 10.0000 mg | ORAL_TABLET | Freq: Every day | ORAL | 11 refills | Status: DC

## 2022-01-15 ENCOUNTER — Encounter (HOSPITAL_COMMUNITY): Payer: Medicare HMO | Admitting: Physical Therapy

## 2022-01-17 ENCOUNTER — Encounter (HOSPITAL_COMMUNITY): Payer: Medicare HMO | Admitting: Physical Therapy

## 2022-01-19 ENCOUNTER — Ambulatory Visit: Payer: Medicare HMO | Admitting: Urology

## 2022-01-23 ENCOUNTER — Encounter (HOSPITAL_COMMUNITY): Payer: Medicare HMO | Admitting: Physical Therapy

## 2022-01-23 ENCOUNTER — Telehealth (HOSPITAL_COMMUNITY): Payer: Self-pay

## 2022-01-23 NOTE — Telephone Encounter (Signed)
Office spoke with pt, pt requested to cancel the rest of 2023 appts and will "get back up" with [place] at the first of the year. All 3 appts have been canceled

## 2022-01-24 DIAGNOSIS — I10 Essential (primary) hypertension: Secondary | ICD-10-CM | POA: Diagnosis not present

## 2022-01-24 DIAGNOSIS — E1142 Type 2 diabetes mellitus with diabetic polyneuropathy: Secondary | ICD-10-CM | POA: Diagnosis not present

## 2022-01-25 ENCOUNTER — Encounter (HOSPITAL_COMMUNITY): Payer: Medicare HMO | Admitting: Physical Therapy

## 2022-01-31 ENCOUNTER — Encounter (HOSPITAL_COMMUNITY): Payer: Medicare HMO | Admitting: Physical Therapy

## 2022-02-19 ENCOUNTER — Encounter (HOSPITAL_COMMUNITY): Payer: Self-pay | Admitting: Physical Therapy

## 2022-02-19 ENCOUNTER — Other Ambulatory Visit: Payer: Self-pay

## 2022-02-19 ENCOUNTER — Ambulatory Visit (HOSPITAL_COMMUNITY): Payer: Medicare HMO | Attending: Neurosurgery | Admitting: Physical Therapy

## 2022-02-19 DIAGNOSIS — M542 Cervicalgia: Secondary | ICD-10-CM | POA: Insufficient documentation

## 2022-02-19 DIAGNOSIS — R29898 Other symptoms and signs involving the musculoskeletal system: Secondary | ICD-10-CM | POA: Insufficient documentation

## 2022-02-19 NOTE — Therapy (Signed)
OUTPATIENT PHYSICAL THERAPY REASSESSMENT   Patient Name: [name] MRN: [id number] DOB:12-15-1940, 82 y.o., male Today's Date: 02/19/2022  END OF SESSION:  PT End of Session - 02/19/22 1410     Visit Number 3    Number of Visits 11    Date for PT Re-Evaluation 03/19/22    Authorization Type [employer]    Authorization Time Period 8 visits requested-  check auth    Authorization - Number of Visits --    Progress Note Due on Visit 13    PT Start Time 1414    PT Stop Time 1452    PT Time Calculation (min) 38 min    Activity Tolerance Patient tolerated treatment well    Behavior During Therapy WFL for tasks assessed/performed             Past Medical History:  Diagnosis Date   Diabetes mellitus    A1c of 7 -12/2009   DJD (degenerative joint disease)    left knee,cervical spine,lumbosacral spine   DVT (deep venous thrombosis) ([place]) 12/06/2009   left lower extremity  following left TKA   Hyperlipidemia    Hypertension    Obesity    Skin cancer    both arms   Past Surgical History:  Procedure Laterality Date   CERVICAL DISCECTOMY     and fusion   COLONOSCOPY  08/15/2011   Procedure: COLONOSCOPY;  Surgeon: Daneil Dolin, MD;  [facility];  Service: Endoscopy;  Laterality: N/A;  11:15 AM   COLONOSCOPY W/ POLYPECTOMY  2003   TOTAL KNEE ARTHROPLASTY  2011   TOTAL KNEE ARTHROPLASTY  03/2008   TOTAL SHOULDER ARTHROPLASTY  05/2009   right    Patient Active Problem List   Diagnosis Date Noted   Chronic neck pain 11/03/2021   Weakness of left hand 11/03/2021   Paresthesia of both hands 11/03/2021   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 11/03/2021   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/02/2020   Dysuria 05/04/2020   Right inguinal pain 05/04/2020   Plantar flexed metatarsal bone of left foot 04/15/2020   Plantar flexed metatarsal bone of right foot 04/15/2020   Pneumonia due to COVID-19 virus 01/02/2020   Acute respiratory failure with  hypoxia ([place]) 01/02/2020   BPH with urinary obstruction 03/06/2019   Nocturia 03/06/2019   Neck pain 01/07/2019   Body mass index (BMI) 27.0-27.9, adult 10/29/2018   Tremor 10/08/2018   DDD (degenerative disc disease), cervical 07/23/2017   Lumbar radiculopathy 05/09/2017   Chronic pain syndrome 04/03/2017   Brachial radiculitis 01/13/2013   Peripheral vascular disease ([place]) 06/12/2010   Hypertension    Diabetes mellitus ([place])    DJD (degenerative joint disease)    HYPERLIPIDEMIA 02/15/2010   OBESITY 02/15/2010   DVT (deep venous thrombosis) ([place]) 12/06/2009    PCP: Carrolyn Meiers  REFERRING PROVIDER: Orvilla Cornwall, MD  REFERRING DIAG: chronic neck pain likely multifactorial including cervical myofascial pain and cervical spondylosis  THERAPY DIAG:  Cervicalgia  Other symptoms and signs involving the musculoskeletal system  Rationale for Evaluation and Treatment: Rehabilitation  ONSET DATE: 3 years   SUBJECTIVE:  SUBJECTIVE STATEMENT: Pt reports that he felt that PT was helping when he was coming but he wanted to hold off returning until after the holidays since staff was sick and holidays. Neck is back to hurting, has not been doing HEP because he was not sure if he was supposed too. Neck has been really hurting the last several days. Continues to have neck pain with sleeping.  PERTINENT HISTORY:  Hx DMII, HLD, HTN, neuropathy, chronic neck pain s/p C3-4 fusion (1996, Dr. Harl Bowie in [facility]), bilateral TKA, right shoulder replacement  PAIN:  Are you having pain? No and Yes: NPRS scale: 0 at rest currently 7/10 Pain location: neck Pain description: sharp, stiff Aggravating factors: rotation Relieving factors: none  PRECAUTIONS: None  WEIGHT BEARING  RESTRICTIONS: No  FALLS:  Has patient fallen in last 6 months? No  LIVING ENVIRONMENT: Lives with: lives alone Lives in: House/apartment Stairs: No Has following equipment at home: None  OCCUPATION: Retired  PLOF: Independent  PATIENT GOALS: decrease symptoms  NEXT MD VISIT: 4 months  OBJECTIVE: (objective measures from initial evaluation unless otherwise dated)  DIAGNOSTIC FINDINGS:  MRI 10/07/21 IMPRESSION: 1. 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 Finding could contribute to underlying neck pain. 2. Prior fusion at C3-4 without residual stenosis. 3. Degenerative spondylosis at C4-5 with resultant mild to moderate spinal stenosis, with moderate left and severe right C5 foraminal narrowing. Probable subtle small foci of chronic myelomalacia involving the bilateral cord at this level, likely stable in retrospect. 4. Advanced degenerative spondylosis at C5-6 and C6-7 with resultant mild to moderate spinal stenosis, with severe bilateral C6 and C7 foraminal stenosis. 5. Severe left-sided facet arthrosis at C7-T1 with resultant severe left C8 foraminal stenosis. 6. Loss of normal flow void within the right vertebral artery, which could be related to slow flow and/or occlusion, new from prior. 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  PATIENT SURVEYS:  FOTO 66% function  02/19/22: 63 % function  COGNITION: Overall cognitive status: Within functional limits for tasks assessed  SENSATION: WFL  POSTURE: rounded shoulders, forward head, and increased thoracic kyphosis  PALPATION: Grossly tender throughout bilateral cervical paraspinals, UT 02/19/22 Grossly tender throughout bilateral cervical paraspinals, UT   CERVICAL ROM:   Active ROM A/PROM  eval AROM 02/19/22  Flexion 0% limited 0% limited  Extension 75% limited * 50% limited *  Right lateral flexion  50% limited* 50% limited*  Left lateral flexion 75% limited* 75% limited*  Right rotation 50 % limited 50 % limited  Left rotation 75% limited* 75% limited*   (Blank rows = not tested) *=pain  UPPER EXTREMITY ROM: WFL  for tasks assessed   UPPER EXTREMITY MMT:  MMT Right eval Left eval Right 02/19/22 Left 02/19/22  Shoulder flexion 4- 4+ 4- 4+  Shoulder extension      Shoulder abduction 4 4+ 4 4+  Shoulder adduction      Shoulder extension      Shoulder internal rotation      Shoulder external rotation      Middle trapezius      Lower trapezius      Elbow flexion 5 5    Elbow extension 5 5    Wrist flexion 5 5    Wrist extension 5 5    Wrist ulnar deviation      Wrist radial deviation      Wrist pronation      Wrist  supination      Grip strength WFL WFL     (Blank rows = not tested)  CERVICAL SPECIAL TESTS:  Distraction test: Negative  TODAY'S TREATMENT:                                                                                                                              DATE:  02/19/22 Reassessment Supine cervical retractions 2 x 10  Supine scapular retractions 2 x 10   Pec stretch in doorway 3 x 20 second holds wall arches 10X UT stretch 3 x 20 second holds  01/08/22 Seated cervical excursions 3D 5X each direction  Cervical retractions 2X10  Scapular retractions 2X10 Standing:  wall arches 10X  UE flexion against wall 10X  Wall push ups 10X  Corner stretch 3X20" Manual :  seated to bil upper traps, scaps and scalenes  12/25/21 Supine cervical retractions 2 x 10  Supine scapular retractions 2 x 10    PATIENT EDUCATION:  Education details: 02/19/22:reassessment findings, POC; EVAL: Patient educated on exam findings, POC, scope of PT, HEP, and posture. Person educated: Patient Education method: Explanation, Demonstration, and Handouts Education comprehension: verbalized understanding, returned demonstration, verbal cues required, and tactile cues  required  HOME EXERCISE PROGRAM: Access Code: [email or web address] 02/19/22 - Doorway Pec Stretch at 60 Elevation  - 1 x daily - 7 x weekly - 3 reps - 20 second hold - Wall Angels  - 1 x daily - 7 x weekly - 10 reps - Seated Upper Trapezius Stretch  - 1 x daily - 7 x weekly - 3 reps - 20 second hold  Date: 12/25/2021 - Supine Chin Tuck  - 2 x daily - 7 x weekly - 2 sets - 10 reps - Supine Scapular Retraction  - 2 x daily - 7 x weekly - 2 sets - 10 reps  ASSESSMENT:  CLINICAL IMPRESSION: Patient returns to PT after several weeks away due to several therapists being sick and wanting to wait ot resume PT after the new year began. Patient has not met any goals at this time but states was feeling improvement in symptoms while attending PT. Patient with continued symptoms, and deficits in ROM, strength, activity tolerance, and functional mobility. Extending POC to continue to reduce deficit. Resumed with previously completed exercises and patient requires cueing for mechanics with fair carry over. Patient will continue to benefit from skilled physical therapy in order to improve function and reduce impairment.   OBJECTIVE IMPAIRMENTS: decreased activity tolerance, decreased mobility, decreased ROM, decreased strength, hypomobility, impaired flexibility, postural dysfunction, and pain.   ACTIVITY LIMITATIONS: carrying, lifting, bending, reach over head, and caring for others  PARTICIPATION LIMITATIONS: meal prep, cleaning, laundry, shopping, community activity, and yard work  PERSONAL FACTORS: Time since onset of injury/illness/exacerbation and 3+ comorbidities: DMII, HLD, HTN, neuropathy, chronic neck pain s/p C3-4 fusion (1996, [facility]. Harl [facility]ie in [facility]), bilateral TKA, right shoulder replacement  are also affecting patient's functional outcome.  REHAB POTENTIAL: Good  CLINICAL DECISION MAKING: Evolving/moderate complexity  EVALUATION COMPLEXITY: Moderate   GOALS: Goals reviewed with patient?  Yes  SHORT TERM GOALS: Target date: 01/08/2022   Patient will be independent with HEP in order to improve functional outcomes. Baseline:  Goal status: IN PROGRESS  2.  Patient will report at least 25% improvement in symptoms for improved quality of life. Baseline:  Goal status: IN PROGRESS    LONG TERM GOALS: Target date: 01/22/2022    Patient will report at least 75% improvement in symptoms for improved quality of life. Baseline:  Goal status: IN PROGRESS  2.  Patient will improve FOTO score by at least 5 points in order to indicate improved tolerance to activity. Baseline: 66% function Goal status: IN PROGRESS  3.  Patient will demonstrate at least 25% improvement in cervical ROM in all restricted planes for improved ability to move head while driving. Baseline: see above Goal status: IN PROGRESS  4.  Patient will report decreased neck and hand symptoms with laying at night to improve sleep quality.  Baseline: impaired Goal status: IN PROGRESS     PLAN:  PT FREQUENCY: 2x/week  PT DURATION: 4 weeks  PLANNED INTERVENTIONS: Therapeutic exercises, Therapeutic activity, Neuromuscular re-education, Balance training, Gait training, Patient/Family education, Joint manipulation, Joint mobilization, Stair training, Orthotic/Fit training, DME instructions, Aquatic Therapy, Dry Needling, Electrical stimulation, Spinal manipulation, Spinal mobilization, Cryotherapy, Moist heat, Compression bandaging, scar mobilization, Splintting, Taping, Traction, Ultrasound, Ionotophoresis '4mg'$ /ml Dexamethasone, and Manual therapy   PLAN FOR NEXT SESSION: continue to progress postural mobility, strength, and function.      [name], PT 02/19/2022, 2:53 PM

## 2022-02-20 ENCOUNTER — Telehealth: Payer: Self-pay

## 2022-02-20 NOTE — Telephone Encounter (Signed)
Patient called and made aware that he should still have refills at the pharmacy per the last prescription. He will need to call pharmacy and request a refill. Patient will call office back if he has any trouble.

## 2022-02-20 NOTE — Telephone Encounter (Signed)
Patient needing a refill on: tadalafil (CIALIS) 5 MG tablet   Pharmacy: [facility] [place], [address] [place] AT [place] Phone: [phone]  Fax: [phone]      Thanks, Helene Kelp

## 2022-02-24 DIAGNOSIS — E1142 Type 2 diabetes mellitus with diabetic polyneuropathy: Secondary | ICD-10-CM | POA: Diagnosis not present

## 2022-02-24 DIAGNOSIS — I10 Essential (primary) hypertension: Secondary | ICD-10-CM | POA: Diagnosis not present

## 2022-02-26 ENCOUNTER — Telehealth (HOSPITAL_COMMUNITY): Payer: Self-pay | Admitting: Physical Therapy

## 2022-02-26 NOTE — Telephone Encounter (Signed)
Returned patient's phone call regarding exercises but patient did not answer. Left message for patient stating PT would try again tomorrow.   2:46 PM, 02/26/22 Mearl Latin PT, DPT Physical Therapist at [facility]

## 2022-02-28 ENCOUNTER — Ambulatory Visit (HOSPITAL_COMMUNITY): Payer: Medicare HMO | Admitting: Physical Therapy

## 2022-03-06 ENCOUNTER — Encounter (HOSPITAL_COMMUNITY): Payer: Medicare HMO | Admitting: Physical Therapy

## 2022-03-08 ENCOUNTER — Encounter (HOSPITAL_COMMUNITY): Payer: Medicare HMO | Admitting: Physical Therapy

## 2022-03-13 ENCOUNTER — Encounter (HOSPITAL_COMMUNITY): Payer: Medicare HMO | Admitting: Physical Therapy

## 2022-03-15 ENCOUNTER — Encounter (HOSPITAL_COMMUNITY): Payer: Medicare HMO | Admitting: Physical Therapy

## 2022-03-20 ENCOUNTER — Encounter (HOSPITAL_COMMUNITY): Payer: Medicare HMO | Admitting: Physical Therapy

## 2022-03-26 ENCOUNTER — Encounter (HOSPITAL_COMMUNITY): Payer: Medicare HMO | Admitting: Physical Therapy

## 2022-03-29 DIAGNOSIS — I10 Essential (primary) hypertension: Secondary | ICD-10-CM | POA: Diagnosis not present

## 2022-03-29 DIAGNOSIS — E1142 Type 2 diabetes mellitus with diabetic polyneuropathy: Secondary | ICD-10-CM | POA: Diagnosis not present

## 2022-04-11 DIAGNOSIS — G894 Chronic pain syndrome: Secondary | ICD-10-CM | POA: Diagnosis not present

## 2022-04-11 DIAGNOSIS — Z79891 Long term (current) use of opiate analgesic: Secondary | ICD-10-CM | POA: Diagnosis not present

## 2022-04-11 DIAGNOSIS — M5030 Other cervical disc degeneration, unspecified cervical region: Secondary | ICD-10-CM | POA: Diagnosis not present

## 2022-04-11 DIAGNOSIS — M5416 Radiculopathy, lumbar region: Secondary | ICD-10-CM | POA: Diagnosis not present

## 2022-04-11 DIAGNOSIS — M5481 Occipital neuralgia: Secondary | ICD-10-CM | POA: Diagnosis not present

## 2022-04-19 DIAGNOSIS — L821 Other seborrheic keratosis: Secondary | ICD-10-CM | POA: Diagnosis not present

## 2022-04-19 DIAGNOSIS — Z85828 Personal history of other malignant neoplasm of skin: Secondary | ICD-10-CM | POA: Diagnosis not present

## 2022-04-19 DIAGNOSIS — X32XXXD Exposure to sunlight, subsequent encounter: Secondary | ICD-10-CM | POA: Diagnosis not present

## 2022-04-19 DIAGNOSIS — L820 Inflamed seborrheic keratosis: Secondary | ICD-10-CM | POA: Diagnosis not present

## 2022-04-19 DIAGNOSIS — Z08 Encounter for follow-up examination after completed treatment for malignant neoplasm: Secondary | ICD-10-CM | POA: Diagnosis not present

## 2022-04-19 DIAGNOSIS — L570 Actinic keratosis: Secondary | ICD-10-CM | POA: Diagnosis not present

## 2022-04-19 DIAGNOSIS — D0439 Carcinoma in situ of skin of other parts of face: Secondary | ICD-10-CM | POA: Diagnosis not present

## 2022-04-27 DIAGNOSIS — I10 Essential (primary) hypertension: Secondary | ICD-10-CM | POA: Diagnosis not present

## 2022-04-27 DIAGNOSIS — E1142 Type 2 diabetes mellitus with diabetic polyneuropathy: Secondary | ICD-10-CM | POA: Diagnosis not present

## 2022-05-01 ENCOUNTER — Emergency Department (HOSPITAL_COMMUNITY): Payer: Medicare HMO

## 2022-05-01 ENCOUNTER — Encounter (HOSPITAL_COMMUNITY): Payer: Self-pay | Admitting: Emergency Medicine

## 2022-05-01 ENCOUNTER — Emergency Department (HOSPITAL_COMMUNITY)
Admission: EM | Admit: 2022-05-01 | Discharge: 2022-05-01 | Disposition: A | Discharge status: Home / Self Care | Payer: Medicare HMO | Attending: Emergency Medicine | Admitting: Emergency Medicine

## 2022-05-01 ENCOUNTER — Other Ambulatory Visit: Payer: Self-pay

## 2022-05-01 DIAGNOSIS — J181 Lobar pneumonia, unspecified organism: Secondary | ICD-10-CM | POA: Insufficient documentation

## 2022-05-01 DIAGNOSIS — J9811 Atelectasis: Secondary | ICD-10-CM | POA: Diagnosis not present

## 2022-05-01 DIAGNOSIS — J189 Pneumonia, unspecified organism: Secondary | ICD-10-CM

## 2022-05-01 DIAGNOSIS — E119 Type 2 diabetes mellitus without complications: Secondary | ICD-10-CM | POA: Diagnosis not present

## 2022-05-01 DIAGNOSIS — Z1152 Encounter for screening for COVID-19: Secondary | ICD-10-CM | POA: Insufficient documentation

## 2022-05-01 DIAGNOSIS — Z79899 Other long term (current) drug therapy: Secondary | ICD-10-CM | POA: Insufficient documentation

## 2022-05-01 DIAGNOSIS — I10 Essential (primary) hypertension: Secondary | ICD-10-CM | POA: Diagnosis not present

## 2022-05-01 DIAGNOSIS — R059 Cough, unspecified: Secondary | ICD-10-CM | POA: Diagnosis not present

## 2022-05-01 DIAGNOSIS — J40 Bronchitis, not specified as acute or chronic: Secondary | ICD-10-CM | POA: Insufficient documentation

## 2022-05-01 DIAGNOSIS — Z7984 Long term (current) use of oral hypoglycemic drugs: Secondary | ICD-10-CM | POA: Insufficient documentation

## 2022-05-01 DIAGNOSIS — J168 Pneumonia due to other specified infectious organisms: Secondary | ICD-10-CM | POA: Diagnosis not present

## 2022-05-01 LAB — COMPREHENSIVE METABOLIC PANEL
ALT: 17 U/L (ref 0–44)
AST: 22 U/L (ref 15–41)
Albumin: 4 g/dL (ref 3.5–5.0)
Alkaline Phosphatase: 58 U/L (ref 38–126)
Anion gap: 11 (ref 5–15)
BUN: 9 mg/dL (ref 8–23)
CO2: 25 mmol/L (ref 22–32)
Calcium: 9 mg/dL (ref 8.9–10.3)
Chloride: 97 mmol/L — ABNORMAL LOW (ref 98–111)
Creatinine, Ser: 0.83 mg/dL (ref 0.61–1.24)
GFR, Estimated: >60 mL/min (ref >60)
Glucose, Bld: 173 mg/dL — ABNORMAL HIGH (ref 70–99)
Potassium: 3.6 mmol/L (ref 3.5–5.1)
Sodium: 133 mmol/L — ABNORMAL LOW (ref 135–145)
Total Bilirubin: 0.8 mg/dL (ref 0.3–1.2)
Total Protein: 6.8 g/dL (ref 6.5–8.1)

## 2022-05-01 LAB — CBC WITH DIFFERENTIAL/PLATELET
Abs Immature Granulocytes: 0.02 10*3/uL (ref 0.00–0.07)
Basophils Absolute: 0 10*3/uL (ref 0.0–0.1)
Basophils Relative: 0 %
Eosinophils Absolute: 0.1 10*3/uL (ref 0.0–0.5)
Eosinophils Relative: 2 %
HCT: 43.2 % (ref 39.0–52.0)
Hemoglobin: 15.2 g/dL (ref 13.0–17.0)
Immature Granulocytes: 0 %
Lymphocytes Relative: 18 %
Lymphs Abs: 0.9 10*3/uL (ref 0.7–4.0)
MCH: 30.5 pg (ref 26.0–34.0)
MCHC: 35.2 g/dL (ref 30.0–36.0)
MCV: 86.7 fL (ref 80.0–100.0)
Monocytes Absolute: 0.5 10*3/uL (ref 0.1–1.0)
Monocytes Relative: 9 %
Neutro Abs: 3.8 10*3/uL (ref 1.7–7.7)
Neutrophils Relative %: 71 %
Platelets: 120 10*3/uL — ABNORMAL LOW (ref 150–400)
RBC: 4.98 MIL/uL (ref 4.22–5.81)
RDW: 13.2 % (ref 11.5–15.5)
WBC: 5.3 10*3/uL (ref 4.0–10.5)
nRBC: 0 % (ref 0.0–0.2)

## 2022-05-01 LAB — RESP PANEL BY RT-PCR (RSV, FLU A&B, COVID)  RVPGX2
Influenza A by PCR: NEGATIVE
Influenza B by PCR: NEGATIVE
Resp Syncytial Virus by PCR: POSITIVE — AB
SARS Coronavirus 2 by RT PCR: NEGATIVE

## 2022-05-01 LAB — MAGNESIUM: Magnesium: 1.4 mg/dL — ABNORMAL LOW (ref 1.7–2.4)

## 2022-05-01 LAB — SARS CORONAVIRUS 2 BY RT PCR: SARS Coronavirus 2 by RT PCR: NEGATIVE

## 2022-05-01 LAB — BRAIN NATRIURETIC PEPTIDE: B Natriuretic Peptide: 82 pg/mL (ref 0.0–100.0)

## 2022-05-01 MED ORDER — SODIUM CHLORIDE 0.9 % IV SOLN
500.0000 mg | Freq: Once | INTRAVENOUS | Status: AC
  Administered 2022-05-01: 500 mg via INTRAVENOUS
  Filled 2022-05-01: qty 5

## 2022-05-01 MED ORDER — MAGNESIUM SULFATE 2 GM/50ML IV SOLN
2.0000 g | Freq: Once | INTRAVENOUS | Status: AC
  Administered 2022-05-01: 2 g via INTRAVENOUS
  Filled 2022-05-01: qty 50

## 2022-05-01 MED ORDER — PREDNISONE 50 MG PO TABS
60.0000 mg | ORAL_TABLET | Freq: Once | ORAL | Status: AC
  Administered 2022-05-01: 60 mg via ORAL
  Filled 2022-05-01: qty 1

## 2022-05-01 MED ORDER — LOSARTAN POTASSIUM 25 MG PO TABS
50.0000 mg | ORAL_TABLET | Freq: Once | ORAL | Status: AC
  Administered 2022-05-01: 50 mg via ORAL
  Filled 2022-05-01: qty 2

## 2022-05-01 MED ORDER — METOPROLOL SUCCINATE ER 50 MG PO TB24
50.0000 mg | ORAL_TABLET | Freq: Every day | ORAL | Status: DC
  Administered 2022-05-01: 50 mg via ORAL
  Filled 2022-05-01: qty 1

## 2022-05-01 MED ORDER — PREDNISONE 10 MG PO TABS: 40.0000 mg | ORAL_TABLET | Freq: Every day | ORAL | 0 refills | Status: AC

## 2022-05-01 MED ORDER — SODIUM CHLORIDE 0.9 % IV SOLN
1.0000 g | Freq: Once | INTRAVENOUS | Status: AC
  Administered 2022-05-01: 1 g via INTRAVENOUS
  Filled 2022-05-01: qty 10

## 2022-05-01 MED ORDER — AMOXICILLIN-POT CLAVULANATE 875-125 MG PO TABS: 1.0000 | ORAL_TABLET | Freq: Two times a day (BID) | ORAL | 0 refills | Status: AC

## 2022-05-01 MED ORDER — IOHEXOL 350 MG/ML SOLN
75.0000 mL | Freq: Once | INTRAVENOUS | Status: AC | PRN
  Administered 2022-05-01: 75 mL via INTRAVENOUS

## 2022-05-01 MED ORDER — AZITHROMYCIN 250 MG PO TABS: 250.0000 mg | ORAL_TABLET | Freq: Every day | ORAL | 0 refills | Status: AC

## 2022-05-01 NOTE — ED Provider Notes (Signed)
[facility] Provider Note   CSN: [id number] Arrival date & time: 05/01/22  0343     History  Chief Complaint  Patient presents with   Cough   Generalized Body Aches    [name] is a 82 y.o. male.   Cough Patient presents for flulike symptoms.  Medical history includes HLD, HTN, DM, prior DVT, PVD, chronic pain, BPH.  He is not currently on a blood thinner.  5 days ago, he had onset of a cough.  This has been persistent since that time.  He has tried Mucinex without relief.  He had a transient episode of dizziness several days ago.  He called his PCP who reportedly took him off of his HCTZ medication.  This was yesterday and patient has not undergone any medication changes since his conversation with PCPs office.  He denies any other recent associated symptoms.  Patient presents to the ED due to the persistence of the cough.  Patient has not taken her medications today.    Home Medications Prior to Admission medications   Medication Sig Start Date End Date Taking? Authorizing Provider  alfuzosin (UROXATRAL) 10 MG 24 hr tablet Take 1 tablet (10 mg total) by mouth at bedtime. 01/10/22  Yes McKenzie, Candee Furbish, MD  amoxicillin-clavulanate (AUGMENTIN) 875-125 MG tablet Take 1 tablet by mouth every 12 (twelve) hours for 7 days. 05/01/22 05/08/22 Yes Godfrey Pick, MD  azithromycin (ZITHROMAX) 250 MG tablet Take 1 tablet (250 mg total) by mouth daily for 4 days. Take 1 every day until finished. 05/01/22 05/05/22 Yes Godfrey Pick, MD  gabapentin (NEURONTIN) 800 MG tablet Take 800 mg by mouth 3 (three) times daily. 11/28/19  Yes [provider]  glipiZIDE (GLUCOTROL) 5 MG tablet Take 5 mg by mouth 2 (two) times daily before a meal. Pt takes one tablet in the AM   Yes [provider]  HYDROcodone-acetaminophen (NORCO) 10-325 MG tablet Take 1 tablet by mouth 4 (four) times daily as needed. 12/04/19  Yes [provider]   losartan-hydrochlorothiazide (HYZAAR) 50-12.5 MG tablet Take 1 tablet by mouth daily.   Yes [provider]  metFORMIN (GLUCOPHAGE) 500 MG tablet Take 1 tablet (500 mg total) by mouth 2 (two) times daily with a meal. Takes one tablet in the AM 01/04/20  Yes Tat, David, MD  metoprolol succinate (TOPROL-XL) 50 MG 24 hr tablet Take 50 mg by mouth daily. 10/20/19  Yes [provider]  Multiple Vitamins-Minerals (MULTIVITAMIN WITH MINERALS) tablet Take 1 tablet by mouth daily.   Yes [provider]  predniSONE (DELTASONE) 10 MG tablet Take 4 tablets (40 mg total) by mouth daily for 4 days. Starting tomorrow 05/01/22 05/05/22 Yes Godfrey Pick, MD  simvastatin (ZOCOR) 40 MG tablet Take 40 mg by mouth at bedtime.   Yes [provider]  tadalafil (CIALIS) 5 MG tablet Take 1 tablet (5 mg total) by mouth daily as needed for erectile dysfunction. 11/27/21  Yes McKenzie, Candee Furbish, MD  TRADJENTA 5 MG TABS tablet Take 5 mg by mouth daily. 03/22/21  Yes [provider]      Allergies    Patient has no known allergies.    Review of Systems   Review of Systems  Respiratory:  Positive for cough.   All other systems reviewed and are negative.   Physical Exam Updated Vital Signs BP (!) 168/94   Pulse 78   Temp 98.3 F (36.8 C) (Oral)   Resp Marland Kitchen)  21   Ht 5\' 11"  (1.803 m)   Wt 78.9 kg   SpO2 96%   BMI 24.27 kg/m  Physical Exam Vitals and nursing note reviewed.  Constitutional:      General: He is not in acute distress.    Appearance: Normal appearance. He is well-developed. He is not ill-appearing, toxic-appearing or diaphoretic.  HENT:     Head: Normocephalic and atraumatic.     Right Ear: External ear normal.     Left Ear: External ear normal.     Nose: Nose normal.     Mouth/Throat:     Mouth: Mucous membranes are moist.  Eyes:     Extraocular Movements: Extraocular movements intact.     Conjunctiva/sclera: Conjunctivae normal.  Cardiovascular:      Rate and Rhythm: Normal rate and regular rhythm.     Heart sounds: No murmur heard. Pulmonary:     Effort: Pulmonary effort is normal. No respiratory distress.     Breath sounds: Normal breath sounds. No wheezing, rhonchi or rales.  Chest:     Chest wall: No tenderness.  Abdominal:     General: There is no distension.     Palpations: Abdomen is soft.     Tenderness: There is no abdominal tenderness.  Musculoskeletal:        General: No swelling. Normal range of motion.     Cervical back: Normal range of motion and neck supple.     Right lower leg: No edema.     Left lower leg: No edema.  Skin:    General: Skin is warm and dry.     Coloration: Skin is not jaundiced or pale.  Neurological:     General: No focal deficit present.     Mental Status: He is alert and oriented to person, place, and time.     Cranial Nerves: No cranial nerve deficit.     Sensory: No sensory deficit.     Motor: No weakness.     Coordination: Coordination normal.  Psychiatric:        Mood and Affect: Mood normal.        Behavior: Behavior normal.        Thought Content: Thought content normal.        Judgment: Judgment normal.     ED Results / Procedures / Treatments   Labs (all labs ordered are listed, but only abnormal results are displayed) Labs Reviewed  RESP PANEL BY RT-PCR (RSV, FLU A&B, COVID)  RVPGX2 - Abnormal; Notable for the following components:      Result Value   Resp Syncytial Virus by PCR POSITIVE (*)    All other components within normal limits  COMPREHENSIVE METABOLIC PANEL - Abnormal; Notable for the following components:   Sodium 133 (*)    Chloride 97 (*)    Glucose, Bld 173 (*)    All other components within normal limits  CBC WITH DIFFERENTIAL/PLATELET - Abnormal; Notable for the following components:   Platelets 120 (*)    All other components within normal limits  MAGNESIUM - Abnormal; Notable for the following components:   Magnesium 1.4 (*)    All other components  within normal limits  SARS CORONAVIRUS 2 BY RT PCR  BRAIN NATRIURETIC PEPTIDE    EKG EKG Interpretation  Date/Time:  Tuesday May 01 2022 08:14:34 EDT Ventricular Rate:  93 PR Interval:  182 QRS Duration: 89 QT Interval:  361 QTC Calculation: 449 R Axis:   -68 Text Interpretation: Sinus rhythm  Left anterior fascicular block Confirmed by Godfrey Pick [phone]) on 05/01/2022 9:07:48 AM  Radiology CT Angio Chest PE W and/or Wo Contrast  Result Date: 05/01/2022 CLINICAL DATA:  Cough, high clinical suspicion for pulmonary embolism EXAM: CT ANGIOGRAPHY CHEST WITH CONTRAST TECHNIQUE: Multidetector CT imaging of the chest was performed using the standard protocol during bolus administration of intravenous contrast. Multiplanar CT image reconstructions and MIPs were obtained to evaluate the vascular anatomy. RADIATION DOSE REDUCTION: This exam was performed according to the departmental dose-optimization program which includes automated exposure control, adjustment of the mA and/or kV according to patient size and/or use of iterative reconstruction technique. CONTRAST:  55mL OMNIPAQUE IOHEXOL 350 MG/ML SOLN COMPARISON:  Chest radiographs done earlier today FINDINGS: Cardiovascular: There is homogeneous enhancement in thoracic aorta. Atherosclerotic plaques and calcifications are seen in thoracic aorta. There is ectasia of ascending thoracic aorta measuring 4.2 cm. There are no intraluminal filling defects in central pulmonary artery branches. Evaluation of small peripheral pulmonary artery branches is limited by motion artifacts and small patchy infiltrates. There are scattered coronary artery calcifications. Mediastinum/Nodes: No significant lymphadenopathy is seen. Thyroid appears to be smaller than usual incised. Lungs/Pleura: Right hemidiaphragm is elevated. This may be due to eventration or paralysis. There are linear patchy densities in both lower lung fields, more so in the right lower lung field. There  are small scattered faint ground-glass infiltrates in right upper and right mid lung fields. There is no focal pulmonary consolidation. There is no pleural effusion or pneumothorax. Upper Abdomen: No acute findings are seen. Musculoskeletal: Degenerative changes are noted in visualized lower cervical spine with significant encroachment of neural foramina by bony spurs. Review of the MIP images confirms the above findings. IMPRESSION: There is no evidence of central pulmonary artery embolism. There is no evidence of thoracic aortic dissection. There is ectasia of ascending thoracic aorta measuring 4.2 cm. Coronary artery calcifications are seen. There are small linear patchy densities in both lower lung fields, more so on the right side suggesting scarring or subsegmental atelectasis. Small scattered faint ground-glass infiltrates are seen in right upper and right mid lung fields suggesting scarring or interstitial pneumonia. There is no focal pulmonary consolidation. 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 Electronically Signed   By: Elmer Picker M.D.   On: 05/01/2022 12:02   DG Chest Portable 1 View  Result Date: 05/01/2022 CLINICAL DATA:  Cough and body aches. EXAM: PORTABLE CHEST 1 VIEW COMPARISON:  12/27/2019. FINDINGS: The heart size and mediastinal contours are within normal limits. There is elevation of the right diaphragm with mild atelectasis at the right lung base. No consolidation, effusion, or pneumothorax. Right shoulder arthroplasty changes are noted. No acute osseous abnormality. IMPRESSION: Elevation of the right diaphragm with atelectasis or infiltrate at the right lung base. Electronically Signed   By: Brett Fairy M.D.   On: 05/01/2022 04:36    Procedures Procedures    Medications Ordered in ED Medications  metoprolol succinate (TOPROL-XL) 24 hr tablet 50 mg (50 mg Oral Given 05/01/22 0855)  cefTRIAXone  (ROCEPHIN) 1 g in sodium chloride 0.9 % 100 mL IVPB (has no administration in time range)  azithromycin (ZITHROMAX) 500 mg in sodium chloride 0.9 % 250 mL IVPB (has no administration in time range)  predniSONE (DELTASONE) tablet 60 mg (has no administration in time range)  losartan (COZAAR) tablet 50 mg (50 mg Oral Given 05/01/22 0855)  magnesium sulfate IVPB 2 g 50 mL (0 g  Intravenous Stopped 05/01/22 1109)  iohexol (OMNIPAQUE) 350 MG/ML injection 75 mL (75 mLs Intravenous Contrast Given 05/01/22 1141)    ED Course/ Medical Decision Making/ A&P                             Medical Decision Making Amount and/or Complexity of Data Reviewed Labs: ordered. Radiology: ordered.  Risk Prescription drug management.   This patient presents to the ED for concern of cough, this involves an extensive number of treatment options, and is a complaint that carries with it a high risk of complications and morbidity.  The differential diagnosis includes bronchitis, pneumonia, exacerbation of chronic lung disease, neoplasm   Co morbidities that complicate the patient evaluation  HLD, HTN, DM, prior DVT, PVD, chronic pain, BPH   Additional history obtained:  Additional history obtained from N/A External records from outside source obtained and reviewed including EMR   Lab Tests:  I Ordered, and personally interpreted labs.  The pertinent results include: Hypomagnesemia is present.  Electrolytes otherwise unremarkable.  Hemoglobin is normal.  No leukocytosis is present.  BNP is normal.  Patient tested positive for RSV.   Imaging Studies ordered:  I ordered imaging studies including chest x-ray, CTA chest I independently visualized and interpreted imaging which showed right upper and middle lobe findings concerning for interstitial pneumonia.  Patchy densities in the basilar lungs consistent with scarring and/or atelectasis. I agree with the radiologist interpretation   Cardiac Monitoring: /  EKG:  The patient was maintained on a cardiac monitor.  I personally viewed and interpreted the cardiac monitored which showed an underlying rhythm of: Sinus rhythm  Problem List / ED Course / Critical interventions / Medication management  Patient presents for persistent cough for the past 5 days.  Besides a transient episode of dizziness 2 days ago, he has not had any other recent associated symptoms.  Patient does live alone.  Vital signs on arrival are notable for hypertension.  Patient has not taken his morning medications.  Currently, his breathing is unlabored.  Lungs are clear to auscultation.  Diagnostic workup was initiated.  Patient's home blood pressure medications were ordered.  He was kept on bedside cardiac monitor.  Initial lab work is notable for hypomagnesemia.  Replacement magnesium was ordered.  Of note, patient has normal hemoglobin, no leukocytosis, and normal BNP.  Given his history of DVT and persistent recent symptoms, patient to undergo CTA of chest.  On CTA, there were findings in right upper and middle lobe concerning for interstitial pneumonia.  Patient was started on antibiotics in addition to steroids for treatment of associated bronchitis.  He remained hemodynamically stable with normal SpO2 on room air.  His breathing remains unlabored.  He is stable for discharge home.  Prescriptions for continued antibiotic and steroid therapy were sent to his pharmacy.  He was advised to return for any worsening of symptoms.  Patient was discharged in good condition. I ordered medication including magnesium sulfate for hypomagnesemia; metoprolol and losartan for hypertension; azithromycin and ceftriaxone for pneumonia; prednisone for bronchitis Reevaluation of the patient after these medicines showed that the patient improved I have reviewed the patients home medicines and have made adjustments as needed   Social Determinants of Health:  Lives independently        Final  Clinical Impression(s) / ED Diagnoses Final diagnoses:  Pneumonia of right upper lobe due to infectious organism  Bronchitis    Rx / DC  Orders ED Discharge Orders          Ordered    amoxicillin-clavulanate (AUGMENTIN) 875-125 MG tablet  Every 12 hours        05/01/22 1301    azithromycin (ZITHROMAX) 250 MG tablet  Daily        05/01/22 1301    predniSONE (DELTASONE) 10 MG tablet  Daily        05/01/22 1301              Godfrey Pick, MD 05/01/22 1304

## 2022-05-01 NOTE — Discharge Instructions (Addendum)
Your magnesium was low today and this was replaced in the emergency department.  You tested positive for RSV.  This is a cold virus.  Additionally, on your CT scan, there does appear to be a right upper and middle lobe pneumonia.  You were started for treatment of pneumonia and bronchitis here in the emergency department.  Prescriptions were sent to your pharmacy for continued antibiotics and steroids.  Take as prescribed starting tomorrow.  Follow-up with your primary care doctor regarding your blood pressure medications.  Return to the emergency department for any worsening of symptoms.

## 2022-05-01 NOTE — ED Triage Notes (Signed)
Pt with c/o cough and body aches since Thursday. States he called his MD yesterday and was given cough medicine but states "it isn't helping".

## 2022-05-02 ENCOUNTER — Telehealth: Payer: Self-pay

## 2022-05-02 NOTE — Telephone Encounter (Signed)
     Patient  visit on 05/01/2022  at [facility] was for Cough  Generalized Body Aches.  Have you been able to follow up with your primary care physician? Patient plans to make appointment this week.  The patient was or was not able to obtain any needed medicine or equipment. Patient was able to obtain medication.  Are there diet recommendations that you are having difficulty following? No  Patient expresses understanding of discharge instructions and education provided has no other needs at this time. Yes   [facility] Resource Care Guide   [email or web address]  ?? [phone]   Website: [email or web address]  [email or web address]

## 2022-05-09 DIAGNOSIS — J410 Simple chronic bronchitis: Secondary | ICD-10-CM | POA: Diagnosis not present

## 2022-05-09 DIAGNOSIS — I10 Essential (primary) hypertension: Secondary | ICD-10-CM | POA: Diagnosis not present

## 2022-05-09 DIAGNOSIS — J121 Respiratory syncytial virus pneumonia: Secondary | ICD-10-CM | POA: Diagnosis not present

## 2022-06-18 DIAGNOSIS — Z1331 Encounter for screening for depression: Secondary | ICD-10-CM | POA: Diagnosis not present

## 2022-06-18 DIAGNOSIS — F1020 Alcohol dependence, uncomplicated: Secondary | ICD-10-CM | POA: Diagnosis not present

## 2022-06-18 DIAGNOSIS — M1990 Unspecified osteoarthritis, unspecified site: Secondary | ICD-10-CM | POA: Diagnosis not present

## 2022-06-18 DIAGNOSIS — Z7189 Other specified counseling: Secondary | ICD-10-CM | POA: Diagnosis not present

## 2022-06-18 DIAGNOSIS — Z1389 Encounter for screening for other disorder: Secondary | ICD-10-CM | POA: Diagnosis not present

## 2022-06-18 DIAGNOSIS — N400 Enlarged prostate without lower urinary tract symptoms: Secondary | ICD-10-CM | POA: Diagnosis not present

## 2022-06-18 DIAGNOSIS — I10 Essential (primary) hypertension: Secondary | ICD-10-CM | POA: Diagnosis not present

## 2022-06-18 DIAGNOSIS — E1142 Type 2 diabetes mellitus with diabetic polyneuropathy: Secondary | ICD-10-CM | POA: Diagnosis not present

## 2022-06-18 DIAGNOSIS — Z0001 Encounter for general adult medical examination with abnormal findings: Secondary | ICD-10-CM | POA: Diagnosis not present

## 2022-06-20 DIAGNOSIS — L570 Actinic keratosis: Secondary | ICD-10-CM | POA: Diagnosis not present

## 2022-06-20 DIAGNOSIS — E1142 Type 2 diabetes mellitus with diabetic polyneuropathy: Secondary | ICD-10-CM | POA: Diagnosis not present

## 2022-06-20 DIAGNOSIS — X32XXXD Exposure to sunlight, subsequent encounter: Secondary | ICD-10-CM | POA: Diagnosis not present

## 2022-06-20 DIAGNOSIS — Z85828 Personal history of other malignant neoplasm of skin: Secondary | ICD-10-CM | POA: Diagnosis not present

## 2022-06-20 DIAGNOSIS — Z0001 Encounter for general adult medical examination with abnormal findings: Secondary | ICD-10-CM | POA: Diagnosis not present

## 2022-06-20 DIAGNOSIS — Z08 Encounter for follow-up examination after completed treatment for malignant neoplasm: Secondary | ICD-10-CM | POA: Diagnosis not present

## 2022-07-17 DIAGNOSIS — Z79891 Long term (current) use of opiate analgesic: Secondary | ICD-10-CM | POA: Diagnosis not present

## 2022-07-17 DIAGNOSIS — M5030 Other cervical disc degeneration, unspecified cervical region: Secondary | ICD-10-CM | POA: Diagnosis not present

## 2022-07-17 DIAGNOSIS — M791 Myalgia, unspecified site: Secondary | ICD-10-CM | POA: Diagnosis not present

## 2022-07-17 DIAGNOSIS — M47812 Spondylosis without myelopathy or radiculopathy, cervical region: Secondary | ICD-10-CM | POA: Diagnosis not present

## 2022-07-17 DIAGNOSIS — M5416 Radiculopathy, lumbar region: Secondary | ICD-10-CM | POA: Diagnosis not present

## 2022-07-19 DIAGNOSIS — E1142 Type 2 diabetes mellitus with diabetic polyneuropathy: Secondary | ICD-10-CM | POA: Diagnosis not present

## 2022-07-19 DIAGNOSIS — I10 Essential (primary) hypertension: Secondary | ICD-10-CM | POA: Diagnosis not present

## 2022-08-18 DIAGNOSIS — E1142 Type 2 diabetes mellitus with diabetic polyneuropathy: Secondary | ICD-10-CM | POA: Diagnosis not present

## 2022-08-18 DIAGNOSIS — I10 Essential (primary) hypertension: Secondary | ICD-10-CM | POA: Diagnosis not present

## 2022-08-23 ENCOUNTER — Ambulatory Visit (HOSPITAL_COMMUNITY)
Admission: RE | Admit: 2022-08-23 | Discharge: 2022-08-23 | Disposition: A | Discharge status: Home / Self Care | Payer: Medicare HMO | Source: Ambulatory Visit | Attending: Internal Medicine | Admitting: Internal Medicine

## 2022-08-23 ENCOUNTER — Other Ambulatory Visit (HOSPITAL_COMMUNITY): Payer: Self-pay | Admitting: Internal Medicine

## 2022-08-23 DIAGNOSIS — N400 Enlarged prostate without lower urinary tract symptoms: Secondary | ICD-10-CM | POA: Diagnosis not present

## 2022-08-23 DIAGNOSIS — Z0001 Encounter for general adult medical examination with abnormal findings: Secondary | ICD-10-CM | POA: Diagnosis not present

## 2022-08-23 DIAGNOSIS — R059 Cough, unspecified: Secondary | ICD-10-CM

## 2022-08-23 DIAGNOSIS — Z96611 Presence of right artificial shoulder joint: Secondary | ICD-10-CM | POA: Diagnosis not present

## 2022-08-23 DIAGNOSIS — J121 Respiratory syncytial virus pneumonia: Secondary | ICD-10-CM | POA: Diagnosis not present

## 2022-08-23 DIAGNOSIS — I10 Essential (primary) hypertension: Secondary | ICD-10-CM | POA: Diagnosis not present

## 2022-08-23 DIAGNOSIS — J984 Other disorders of lung: Secondary | ICD-10-CM | POA: Diagnosis not present

## 2022-08-23 DIAGNOSIS — R9389 Abnormal findings on diagnostic imaging of other specified body structures: Secondary | ICD-10-CM | POA: Diagnosis not present

## 2022-08-23 DIAGNOSIS — J410 Simple chronic bronchitis: Secondary | ICD-10-CM | POA: Diagnosis not present

## 2022-09-21 DIAGNOSIS — E119 Type 2 diabetes mellitus without complications: Secondary | ICD-10-CM | POA: Diagnosis not present

## 2022-09-23 DIAGNOSIS — E1142 Type 2 diabetes mellitus with diabetic polyneuropathy: Secondary | ICD-10-CM | POA: Diagnosis not present

## 2022-09-23 DIAGNOSIS — I10 Essential (primary) hypertension: Secondary | ICD-10-CM | POA: Diagnosis not present

## 2022-09-25 DIAGNOSIS — H524 Presbyopia: Secondary | ICD-10-CM | POA: Diagnosis not present

## 2022-10-04 DIAGNOSIS — R251 Tremor, unspecified: Secondary | ICD-10-CM | POA: Diagnosis not present

## 2022-10-04 DIAGNOSIS — E1142 Type 2 diabetes mellitus with diabetic polyneuropathy: Secondary | ICD-10-CM | POA: Diagnosis not present

## 2022-10-04 DIAGNOSIS — M5030 Other cervical disc degeneration, unspecified cervical region: Secondary | ICD-10-CM | POA: Diagnosis not present

## 2022-10-09 ENCOUNTER — Encounter: Payer: Self-pay | Admitting: Neurology

## 2022-10-16 DIAGNOSIS — M5030 Other cervical disc degeneration, unspecified cervical region: Secondary | ICD-10-CM | POA: Diagnosis not present

## 2022-10-16 DIAGNOSIS — M542 Cervicalgia: Secondary | ICD-10-CM | POA: Diagnosis not present

## 2022-10-16 DIAGNOSIS — Z79891 Long term (current) use of opiate analgesic: Secondary | ICD-10-CM | POA: Diagnosis not present

## 2022-10-16 DIAGNOSIS — M5416 Radiculopathy, lumbar region: Secondary | ICD-10-CM | POA: Diagnosis not present

## 2022-10-16 DIAGNOSIS — M47812 Spondylosis without myelopathy or radiculopathy, cervical region: Secondary | ICD-10-CM | POA: Diagnosis not present

## 2022-10-16 DIAGNOSIS — M791 Myalgia, unspecified site: Secondary | ICD-10-CM | POA: Diagnosis not present

## 2022-10-23 NOTE — Progress Notes (Unsigned)
Assessment/Plan:   1.  Tremor.  -Long discussion with the patient regarding the complex effect that alcohol can have on tremor.  Chronic alcohol use can produce a tremor but discontinuation of the alcohol can also cause a tremulous state for quite some time.  We talked about the importance of weaning alcohol under medical supervision.  We talked about the fact that rapid discontinuation can cause withdrawal seizure, which is why medical supervision is of the upmost importance.    - We discussed that he certainly could have essential tremor on top of the influence that alcohol plays.  We discussed that this can continue to gradually get worse with time.  We discussed that some medications can worsen this, as can caffeine use.  We discussed medication therapy as well as surgical therapy.  Ultimately, the patient decided to trial low-dose primidone, 50 mg twice per day.  Risk, benefits, side effects discussed.  2.  Depression/adjustment disorder, due to loss of significant other  -He and I discussed that counseling resources could certainly help.  Resources were provided.    Subjective:   [name] was seen in consultation in the movement disorder clinic at the request of Fanta, Tesfaye Demissie*.  The evaluation is for tremor.  Prior medical records are reviewed.  Patient previously under the care of Dr. Frances Furbish.  He started seeing her for tremor in October, 2020.  No medications were prescribed at that visit, it was recommended that he decrease alcohol intake.  He followed back up with her in 2022 and her examination was stable and recommendations were likewise the same.  She ordered an MRI of the brain done that visit and it was ultimately done at [facility].  I do not have the films, but do have the report from Jun 28, 2020.  This was noted to show age-related changes.  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  He was  referred to orthopedics/spine at [facility].  He subsequently went to [facility] neurosurgery.  He continues to follow there, but the large majority of visits are telemedicine, and was last seen about a year ago.  Pt states "I quit seeing him because they only wanted to see me over the phone and no surgery was done and I didn't get anywhere."  Tremor started approximately 3 years ago and involves the L>R hand.  He is L handed.  Tremor is most noticeable when writing/screwing in a screw.   There is no family hx of tremor.    Affected by caffeine:  No. (only drinks 1 cup coffee in the AM) Affected by alcohol:  unknown (drinks anywhere from 2 beers to 6-7 beers/day) Affected by stress:  No. Affected by fatigue:  No. Spills soup if on spoon:  No. Spills glass of liquid if full:  No. But the cup will shake Affects ADL's (tying shoes, brushing teeth, etc):  No.  Current/Previously tried tremor medications: Metoprolol ER, 25 mg daily (not on anymore); gabapentin 800 mg 3 times per day  Current medications that may exacerbate tremor: n/a  Outside reports reviewed: historical medical records, office notes, and referral letter/letters.  Separately, admits to depressed mood.  His significant other of 15 years died and he has just felt lonely.  His grandson currently lives with him, but plans to move out soon.  No Known Allergies  Current Meds  Medication Sig   alfuzosin (UROXATRAL) 10 MG 24 hr  tablet Take 1 tablet (10 mg total) by mouth at bedtime.   gabapentin (NEURONTIN) 800 MG tablet Take 800 mg by mouth 3 (three) times daily.   HYDROcodone-acetaminophen (NORCO) 10-325 MG tablet Take 1 tablet by mouth 4 (four) times daily as needed.   losartan-hydrochlorothiazide (HYZAAR) 50-12.5 MG tablet Take 1 tablet by mouth daily.   metFORMIN (GLUCOPHAGE) 500 MG tablet Take 1 tablet (500 mg total) by mouth 2 (two) times daily with a meal. Takes one tablet in the AM   Multiple Vitamins-Minerals (MULTIVITAMIN WITH  MINERALS) tablet Take 1 tablet by mouth daily.   simvastatin (ZOCOR) 40 MG tablet Take 40 mg by mouth at bedtime.   tadalafil (CIALIS) 5 MG tablet Take 1 tablet (5 mg total) by mouth daily as needed for erectile dysfunction.   TRADJENTA 5 MG TABS tablet Take 5 mg by mouth daily.      Objective:   VITALS:   Vitals:   10/25/22 0922  BP: (!) 166/94  Pulse: 78  SpO2: 96%  Weight: 179 lb 6.4 oz (81.4 kg)  Height: 5\' 11"  (1.803 m)   Gen:  Appears stated age and in NAD. HEENT:  Normocephalic, atraumatic. The mucous membranes are moist. The superficial temporal arteries are without ropiness or tenderness. Cardiovascular: Regular rate and rhythm. Lungs: Clear to auscultation bilaterally. Neck: There are no carotid bruits noted bilaterally.  NEUROLOGICAL:  Orientation:  The patient is alert and oriented x 3.   Cranial nerves: There is good facial symmetry. Extraocular muscles are intact and visual fields are full to confrontational testing. Speech is fluent and clear. Soft palate rises symmetrically and there is no tongue deviation. Hearing is intact to conversational tone. Tone: Tone is good throughout. Sensation: Sensation is intact to light touch touch throughout (facial, trunk, extremities). Vibration is intact at the bilateral big toe. There is no extinction with double simultaneous stimulation. There is no sensory dermatomal level identified. Coordination:  The patient has no dysdiadichokinesia or dysmetria. Motor: Strength is 5/5 in the bilateral upper and lower extremities.  Shoulder shrug is equal bilaterally.  There is no pronator drift.  There are no fasciculations noted. DTR's: Deep tendon reflexes are 2/4 at the bilateral biceps, triceps, brachioradialis, right patella and absent at the left patella.  Plantar responses are downgoing bilaterally. Gait and Station: The patient is able to ambulate without difficulty. The patient is able to heel toe walk without any difficulty. The  patient is able to ambulate in a tandem fashion. The patient is able to stand in the Romberg position.   MOVEMENT EXAM: Tremor:  There is no rest tremor.  There is not much postural tremor.  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  This is overall mild.  He has mild trouble pouring water from 1 glass to another, especially when the full glass is in the left hand.  He does have trouble with Archimedes spirals, especially with the left hand.        I have reviewed and interpreted the following labs independently   Chemistry      Component Value Date/Time   NA 133 (L) 05/01/2022 0820   NA 141 06/20/2020 1353   K 3.6 05/01/2022 0820   CL 97 (L) 05/01/2022 0820   CO2 25 05/01/2022 0820   BUN 9 05/01/2022 0820   BUN 15 06/20/2020 1353   CREATININE 0.83 05/01/2022 0820      Component Value Date/Time   CALCIUM 9.0 05/01/2022 0820   ALKPHOS  58 05/01/2022 0820   AST 22 05/01/2022 0820   ALT 17 05/01/2022 0820   BILITOT 0.8 05/01/2022 0820   BILITOT 0.8 06/20/2020 1353      Lab Results  Component Value Date   WBC 5.3 05/01/2022   HGB 15.2 05/01/2022   HCT 43.2 05/01/2022   MCV 86.7 05/01/2022   PLT 120 (L) 05/01/2022   No results found for: "TSH"    Total time spent on today's visit was 45 minutes, including both face-to-face time and nonface-to-face time.  Time included that spent on review of records (prior notes available to me/labs/imaging if pertinent), discussing treatment and goals, answering patient's questions and coordinating care.  CC:  Benetta Spar, MD

## 2022-10-25 ENCOUNTER — Ambulatory Visit (INDEPENDENT_AMBULATORY_CARE_PROVIDER_SITE_OTHER): Payer: Medicare HMO | Admitting: Neurology

## 2022-10-25 ENCOUNTER — Encounter: Payer: Self-pay | Admitting: Neurology

## 2022-10-25 VITALS — BP 160/88 | HR 78 | Ht 71.0 in | Wt 179.4 lb

## 2022-10-25 DIAGNOSIS — R251 Tremor, unspecified: Secondary | ICD-10-CM

## 2022-10-25 DIAGNOSIS — F4321 Adjustment disorder with depressed mood: Secondary | ICD-10-CM

## 2022-10-25 DIAGNOSIS — F1090 Alcohol use, unspecified, uncomplicated: Secondary | ICD-10-CM | POA: Diagnosis not present

## 2022-10-25 MED ORDER — PRIMIDONE 50 MG PO TABS: 50.0000 mg | ORAL_TABLET | Freq: Two times a day (BID) | ORAL | 1 refills | Status: DC

## 2022-10-25 NOTE — Patient Instructions (Signed)
Start primidone 50 mg - 1/2 tablet at bedtime for 1 week and then increase to 1 tablet at bedtime x 1 week and then 1 twice per day thereafter  I would encourage you to go to counseling for the depression!  You can use hospice grief services for free.  The physicians and staff at [facility] are committed to providing excellent care. You may receive a survey requesting feedback about your experience at our office. We strive to receive "very good" responses to the survey questions. If you feel that your experience would prevent you from giving the office a "very good " response, please contact our office to try to remedy the situation. We may be reached at [phone]. Thank you for taking the time out of your busy day to complete the survey.

## 2022-10-30 ENCOUNTER — Telehealth: Payer: Self-pay | Admitting: Neurology

## 2022-10-30 NOTE — Telephone Encounter (Signed)
Pt wants to know if he can take his Primidone with other medication.  Left message with after hour service

## 2022-10-30 NOTE — Telephone Encounter (Signed)
Called patient back and let him know he is ok that Dr. Arbutus Leas went over all his meds at the visit and unless he is on any new meds then he is fine to take

## 2022-11-04 DIAGNOSIS — E1142 Type 2 diabetes mellitus with diabetic polyneuropathy: Secondary | ICD-10-CM | POA: Diagnosis not present

## 2022-11-04 DIAGNOSIS — I10 Essential (primary) hypertension: Secondary | ICD-10-CM | POA: Diagnosis not present

## 2022-11-07 ENCOUNTER — Ambulatory Visit (HOSPITAL_COMMUNITY)
Admission: RE | Admit: 2022-11-07 | Discharge: 2022-11-07 | Disposition: A | Discharge status: Home / Self Care | Payer: Medicare HMO | Source: Ambulatory Visit | Attending: Gerontology | Admitting: Gerontology

## 2022-11-07 ENCOUNTER — Other Ambulatory Visit (HOSPITAL_COMMUNITY): Payer: Self-pay | Admitting: Gerontology

## 2022-11-07 DIAGNOSIS — M25432 Effusion, left wrist: Secondary | ICD-10-CM | POA: Insufficient documentation

## 2022-11-07 DIAGNOSIS — M1812 Unilateral primary osteoarthritis of first carpometacarpal joint, left hand: Secondary | ICD-10-CM | POA: Diagnosis not present

## 2022-11-07 DIAGNOSIS — M25532 Pain in left wrist: Secondary | ICD-10-CM | POA: Insufficient documentation

## 2022-11-07 DIAGNOSIS — M7989 Other specified soft tissue disorders: Secondary | ICD-10-CM | POA: Diagnosis not present

## 2022-11-07 DIAGNOSIS — E1142 Type 2 diabetes mellitus with diabetic polyneuropathy: Secondary | ICD-10-CM | POA: Diagnosis not present

## 2022-11-07 DIAGNOSIS — Z23 Encounter for immunization: Secondary | ICD-10-CM | POA: Diagnosis not present

## 2022-11-07 DIAGNOSIS — M109 Gout, unspecified: Secondary | ICD-10-CM | POA: Diagnosis not present

## 2022-11-19 DIAGNOSIS — C44319 Basal cell carcinoma of skin of other parts of face: Secondary | ICD-10-CM | POA: Diagnosis not present

## 2022-11-19 DIAGNOSIS — L570 Actinic keratosis: Secondary | ICD-10-CM | POA: Diagnosis not present

## 2022-11-19 DIAGNOSIS — X32XXXD Exposure to sunlight, subsequent encounter: Secondary | ICD-10-CM | POA: Diagnosis not present

## 2022-11-26 DIAGNOSIS — M13832 Other specified arthritis, left wrist: Secondary | ICD-10-CM | POA: Diagnosis not present

## 2022-11-26 DIAGNOSIS — M1812 Unilateral primary osteoarthritis of first carpometacarpal joint, left hand: Secondary | ICD-10-CM | POA: Diagnosis not present

## 2022-12-08 DIAGNOSIS — E1142 Type 2 diabetes mellitus with diabetic polyneuropathy: Secondary | ICD-10-CM | POA: Diagnosis not present

## 2022-12-08 DIAGNOSIS — I10 Essential (primary) hypertension: Secondary | ICD-10-CM | POA: Diagnosis not present

## 2022-12-10 ENCOUNTER — Telehealth: Payer: Self-pay

## 2022-12-10 NOTE — Telephone Encounter (Signed)
Called patient and let him know that Dr. Arbutus Leas said to wait two weeks and start agin on the 18th and I will call and check on him on the 22nd

## 2022-12-10 NOTE — Telephone Encounter (Signed)
Patient called to let me know his tremor is back. I asked patient if he had reduced his alcohol use and he did say yes to 3-4 days a week a few beers. Patient also stated that he has stopped taking his Primidone because he felt it caused diarrhea nad now that he has stopped that the tremor will return. Patient wanting to know if there is some thing else he can take

## 2022-12-26 ENCOUNTER — Other Ambulatory Visit: Payer: Self-pay | Admitting: Urology

## 2022-12-26 DIAGNOSIS — N401 Enlarged prostate with lower urinary tract symptoms: Secondary | ICD-10-CM

## 2023-01-07 ENCOUNTER — Other Ambulatory Visit: Payer: Self-pay

## 2023-01-14 ENCOUNTER — Encounter: Payer: Self-pay | Admitting: Urology

## 2023-01-14 ENCOUNTER — Ambulatory Visit: Payer: Medicare HMO | Admitting: Urology

## 2023-01-14 VITALS — BP 148/75 | HR 73

## 2023-01-14 DIAGNOSIS — R339 Retention of urine, unspecified: Secondary | ICD-10-CM | POA: Diagnosis not present

## 2023-01-14 DIAGNOSIS — N5201 Erectile dysfunction due to arterial insufficiency: Secondary | ICD-10-CM

## 2023-01-14 DIAGNOSIS — N138 Other obstructive and reflux uropathy: Secondary | ICD-10-CM

## 2023-01-14 DIAGNOSIS — N401 Enlarged prostate with lower urinary tract symptoms: Secondary | ICD-10-CM | POA: Diagnosis not present

## 2023-01-14 MED ORDER — TADALAFIL 5 MG PO TABS: 5.0000 mg | ORAL_TABLET | Freq: Every day | ORAL | 11 refills | Status: DC | PRN

## 2023-01-14 MED ORDER — SILODOSIN 8 MG PO CAPS: 8.0000 mg | ORAL_CAPSULE | Freq: Every day | ORAL | 11 refills | Status: DC

## 2023-01-14 MED ORDER — ALFUZOSIN HCL ER 10 MG PO TB24: 10.0000 mg | ORAL_TABLET | Freq: Every day | ORAL | 3 refills | Status: DC

## 2023-01-14 NOTE — Progress Notes (Signed)
post void residual= 153

## 2023-01-14 NOTE — Patient Instructions (Signed)

## 2023-01-14 NOTE — Progress Notes (Signed)
01/14/2023 2:56 PM   [name] 09/04/1940 [id number]  Referring provider: Benetta Spar, MD [address] [place],  [place] [address]  Followup BPH   HPI: [name] is a 82yo here for followup for BPH with incomplete emptying. PVR 153cc. When he remembers to take his rapaflo he urinates well. If he forgets to take the medication his stream is weaker and his nocturia increases. Urine stream strong. No straining to urinate. IPSS 12 QOL 3 on rapalfo 8mg  daily He uses tadalafil 5-20mg  PRN with good results.    PMH: Past Medical History:  Diagnosis Date   Diabetes mellitus    A1c of 7 -12/2009   DJD (degenerative joint disease)    left knee,cervical spine,lumbosacral spine   DVT (deep venous thrombosis) (HCC) 12/06/2009   left lower extremity  following left TKA   Hyperlipidemia    Hypertension    Obesity    Skin cancer    both arms    Surgical History: Past Surgical History:  Procedure Laterality Date   CERVICAL DISCECTOMY     and fusion   COLONOSCOPY  08/15/2011   Procedure: COLONOSCOPY;  Surgeon: Corbin Ade, MD;  Location: [facility];  Service: Endoscopy;  Laterality: N/A;  11:15 AM   COLONOSCOPY W/ POLYPECTOMY  2003   TOTAL KNEE ARTHROPLASTY  2011   TOTAL KNEE ARTHROPLASTY  03/2008   TOTAL SHOULDER ARTHROPLASTY  05/2009   right     Home Medications:  Allergies as of 01/14/2023   No Known Allergies      Medication List        Accurate as of January 14, 2023  2:56 PM. If you have any questions, ask your nurse or doctor.          alfuzosin 10 MG 24 hr tablet Commonly known as: UROXATRAL TAKE 1 TABLET AT BEDTIME   gabapentin 800 MG tablet Commonly known as: NEURONTIN Take 800 mg by mouth 3 (three) times daily.   HYDROcodone-acetaminophen 10-325 MG tablet Commonly known as: NORCO Take 1 tablet by mouth 4 (four) times daily as needed.   losartan-hydrochlorothiazide 50-12.5 MG tablet Commonly known as: HYZAAR Take 1  tablet by mouth daily.   metFORMIN 500 MG tablet Commonly known as: GLUCOPHAGE Take 1 tablet (500 mg total) by mouth 2 (two) times daily with a meal. Takes one tablet in the AM   metoprolol succinate 50 MG 24 hr tablet Commonly known as: TOPROL-XL Take 50 mg by mouth daily.   multivitamin with minerals tablet Take 1 tablet by mouth daily.   primidone 50 MG tablet Commonly known as: MYSOLINE Take 1 tablet (50 mg total) by mouth 2 (two) times daily.   silodosin 8 MG Caps capsule Commonly known as: RAPAFLO Take 8 mg by mouth daily.   simvastatin 40 MG tablet Commonly known as: ZOCOR Take 40 mg by mouth at bedtime.   tadalafil 5 MG tablet Commonly known as: CIALIS Take 1 tablet (5 mg total) by mouth daily as needed for erectile dysfunction.   Tradjenta 5 MG Tabs tablet Generic drug: linagliptin Take 5 mg by mouth daily.        Allergies: No Known Allergies  Family History: Family History  Problem Relation Age of Onset   Stroke Mother    Cancer - Lung Father    Cancer - Lung Sister    Lung cancer Brother        smoked   Cancer - Lung Brother  Cancer - Lung Brother    Cancer - Lung Brother    Cancer - Lung Brother    Tremor Neg Hx     Social History:  reports that he has never smoked. He has never used smokeless tobacco. He reports current alcohol use of about 12.0 - 24.0 standard drinks of alcohol per week. He reports that he does not use drugs.  ROS: All other review of systems were reviewed and are negative except what is noted above in HPI  Physical Exam: BP (!) 148/75   Pulse 73   Constitutional:  Alert and oriented, No acute distress. HEENT: [place] AT, moist mucus membranes.  Trachea midline, no masses. Cardiovascular: No clubbing, cyanosis, or edema. Respiratory: Normal respiratory effort, no increased work of breathing. GI: Abdomen is soft, nontender, nondistended, no abdominal masses GU: No CVA tenderness.  Lymph: No cervical or inguinal  lymphadenopathy. Skin: No rashes, bruises or suspicious lesions. Neurologic: Grossly intact, no focal deficits, moving all 4 extremities. Psychiatric: Normal mood and affect.  Laboratory Data: Lab Results  Component Value Date   WBC 5.3 05/01/2022   HGB 15.2 05/01/2022   HCT 43.2 05/01/2022   MCV 86.7 05/01/2022   PLT 120 (L) 05/01/2022    Lab Results  Component Value Date   CREATININE 0.83 05/01/2022    No results found for: "PSA"  No results found for: "TESTOSTERONE"  Lab Results  Component Value Date   HGBA1C 8.0 (H) 01/02/2020    Urinalysis    Component Value Date/Time   COLORURINE YELLOW 12/23/2009 0151   APPEARANCEUR Clear 07/14/2021 1005   LABSPEC 1.020 12/23/2009 0151   PHURINE 7.5 12/23/2009 0151   GLUCOSEU Negative 07/14/2021 1005   HGBUR NEGATIVE 12/23/2009 0151   BILIRUBINUR Negative 07/14/2021 1005   KETONESUR 40 (A) 12/23/2009 0151   PROTEINUR Negative 07/14/2021 1005   PROTEINUR NEGATIVE 12/23/2009 0151   UROBILINOGEN 1.0 12/23/2009 0151   NITRITE Negative 07/14/2021 1005   NITRITE NEGATIVE 12/23/2009 0151   LEUKOCYTESUR Negative 07/14/2021 1005    Lab Results  Component Value Date   LABMICR Comment 07/14/2021   WBCUA None seen 01/23/2021   LABEPIT None seen 01/23/2021   BACTERIA None seen 01/23/2021    Pertinent Imaging:  No results found for this or any previous visit.  No results found for this or any previous visit.  No results found for this or any previous visit.  No results found for this or any previous visit.  No results found for this or any previous visit.  No valid procedures specified. No results found for this or any previous visit.  No results found for this or any previous visit.   Assessment & Plan:    1. BPH with urinary obstruction -continue rapaflo 8mg  daily - Urinalysis, Routine w reflex microscopic  2. Incomplete bladder emptying -continue rapalfo 8mg  daily  3. Erectile dysfunction due to arterial  insufficiency Tadalafil 5-20mg  prn   No follow-ups on file.  Wilkie Aye, MD  [facility] [place]

## 2023-01-15 LAB — URINALYSIS, ROUTINE W REFLEX MICROSCOPIC
Bilirubin, UA: NEGATIVE
Glucose, UA: NEGATIVE
Ketones, UA: NEGATIVE
Leukocytes,UA: NEGATIVE
Nitrite, UA: NEGATIVE
RBC, UA: NEGATIVE
Specific Gravity, UA: 1.02 (ref 1.005–1.030)
Urobilinogen, Ur: 1 mg/dL (ref 0.2–1.0)
pH, UA: 6.5 (ref 5.0–7.5)

## 2023-01-15 LAB — MICROSCOPIC EXAMINATION
Bacteria, UA: NONE SEEN
WBC, UA: NONE SEEN /[HPF] (ref 0–5)

## 2023-01-16 ENCOUNTER — Telehealth: Payer: Self-pay | Admitting: Urology

## 2023-01-16 ENCOUNTER — Other Ambulatory Visit: Payer: Self-pay

## 2023-01-16 DIAGNOSIS — N5201 Erectile dysfunction due to arterial insufficiency: Secondary | ICD-10-CM

## 2023-01-16 DIAGNOSIS — N138 Other obstructive and reflux uropathy: Secondary | ICD-10-CM

## 2023-01-16 MED ORDER — SILODOSIN 8 MG PO CAPS: 8.0000 mg | ORAL_CAPSULE | Freq: Every day | ORAL | 11 refills | Status: DC

## 2023-01-16 MED ORDER — TADALAFIL 5 MG PO TABS: 5.0000 mg | ORAL_TABLET | Freq: Every day | ORAL | 11 refills | Status: DC | PRN

## 2023-01-16 MED ORDER — ALFUZOSIN HCL ER 10 MG PO TB24: 10.0000 mg | ORAL_TABLET | Freq: Every day | ORAL | 3 refills | Status: DC

## 2023-01-16 NOTE — Telephone Encounter (Signed)
Pharmacy updated and prescriptions sent to pharmacy

## 2023-01-16 NOTE — Telephone Encounter (Signed)
Rx was called into [facility] he needs it sent to [facility] [place]

## 2023-01-28 DIAGNOSIS — M19032 Primary osteoarthritis, left wrist: Secondary | ICD-10-CM | POA: Diagnosis not present

## 2023-01-28 DIAGNOSIS — I10 Essential (primary) hypertension: Secondary | ICD-10-CM | POA: Diagnosis not present

## 2023-01-28 DIAGNOSIS — N400 Enlarged prostate without lower urinary tract symptoms: Secondary | ICD-10-CM | POA: Diagnosis not present

## 2023-01-28 DIAGNOSIS — E1142 Type 2 diabetes mellitus with diabetic polyneuropathy: Secondary | ICD-10-CM | POA: Diagnosis not present

## 2023-02-01 DIAGNOSIS — H6123 Impacted cerumen, bilateral: Secondary | ICD-10-CM | POA: Diagnosis not present

## 2023-02-07 DIAGNOSIS — M5030 Other cervical disc degeneration, unspecified cervical region: Secondary | ICD-10-CM | POA: Diagnosis not present

## 2023-02-07 DIAGNOSIS — Z79891 Long term (current) use of opiate analgesic: Secondary | ICD-10-CM | POA: Diagnosis not present

## 2023-02-07 DIAGNOSIS — M5416 Radiculopathy, lumbar region: Secondary | ICD-10-CM | POA: Diagnosis not present

## 2023-02-07 DIAGNOSIS — Z5181 Encounter for therapeutic drug level monitoring: Secondary | ICD-10-CM | POA: Diagnosis not present

## 2023-02-07 DIAGNOSIS — Z79899 Other long term (current) drug therapy: Secondary | ICD-10-CM | POA: Diagnosis not present

## 2023-02-18 DIAGNOSIS — X32XXXD Exposure to sunlight, subsequent encounter: Secondary | ICD-10-CM | POA: Diagnosis not present

## 2023-02-18 DIAGNOSIS — L570 Actinic keratosis: Secondary | ICD-10-CM | POA: Diagnosis not present

## 2023-02-18 DIAGNOSIS — L2989 Other pruritus: Secondary | ICD-10-CM | POA: Diagnosis not present

## 2023-02-18 DIAGNOSIS — L298 Other pruritus: Secondary | ICD-10-CM | POA: Diagnosis not present

## 2023-02-18 DIAGNOSIS — C4401 Basal cell carcinoma of skin of lip: Secondary | ICD-10-CM | POA: Diagnosis not present

## 2023-02-28 DIAGNOSIS — I10 Essential (primary) hypertension: Secondary | ICD-10-CM | POA: Diagnosis not present

## 2023-02-28 DIAGNOSIS — E1142 Type 2 diabetes mellitus with diabetic polyneuropathy: Secondary | ICD-10-CM | POA: Diagnosis not present

## 2023-03-04 ENCOUNTER — Other Ambulatory Visit: Payer: Self-pay | Admitting: Neurology

## 2023-03-05 ENCOUNTER — Telehealth: Payer: Self-pay | Admitting: Neurology

## 2023-03-05 NOTE — Telephone Encounter (Signed)
Pt need a refill on his primidone 50mg . Once daily. He said the pharmacy states his refill too soon, but he will run out  [facility] [address] [place]

## 2023-03-06 NOTE — Telephone Encounter (Signed)
This was already filled 03/04/2023.

## 2023-03-20 ENCOUNTER — Telehealth: Payer: Self-pay | Admitting: Urology

## 2023-03-20 NOTE — Telephone Encounter (Signed)
[facility] will not fill RX, he would like someone to take care of the issue.

## 2023-03-21 NOTE — Telephone Encounter (Signed)
Noted

## 2023-03-25 ENCOUNTER — Other Ambulatory Visit: Payer: Self-pay

## 2023-03-25 MED ORDER — SILODOSIN 8 MG PO CAPS: 8.0000 mg | ORAL_CAPSULE | Freq: Every day | ORAL | 3 refills | Status: DC

## 2023-03-25 NOTE — Telephone Encounter (Signed)
Needs RX fixed with [facility]

## 2023-03-26 NOTE — Telephone Encounter (Signed)
Rx resent 03/25/23

## 2023-03-31 DIAGNOSIS — E1142 Type 2 diabetes mellitus with diabetic polyneuropathy: Secondary | ICD-10-CM | POA: Diagnosis not present

## 2023-03-31 DIAGNOSIS — I10 Essential (primary) hypertension: Secondary | ICD-10-CM | POA: Diagnosis not present

## 2023-04-01 DIAGNOSIS — L570 Actinic keratosis: Secondary | ICD-10-CM | POA: Diagnosis not present

## 2023-04-01 DIAGNOSIS — Z08 Encounter for follow-up examination after completed treatment for malignant neoplasm: Secondary | ICD-10-CM | POA: Diagnosis not present

## 2023-04-01 DIAGNOSIS — Z85828 Personal history of other malignant neoplasm of skin: Secondary | ICD-10-CM | POA: Diagnosis not present

## 2023-04-01 DIAGNOSIS — D0462 Carcinoma in situ of skin of left upper limb, including shoulder: Secondary | ICD-10-CM | POA: Diagnosis not present

## 2023-04-01 DIAGNOSIS — X32XXXD Exposure to sunlight, subsequent encounter: Secondary | ICD-10-CM | POA: Diagnosis not present

## 2023-04-22 ENCOUNTER — Ambulatory Visit: Payer: Medicare HMO | Admitting: Podiatry

## 2023-04-22 ENCOUNTER — Telehealth: Payer: Self-pay | Admitting: Podiatry

## 2023-04-22 NOTE — Telephone Encounter (Signed)
 Pt left message todaya t 814am to cxl his appt for today.  I returned call and spoke to pt to confirm appt is to be cxled and he will call to r/s

## 2023-04-24 NOTE — Progress Notes (Unsigned)
 Assessment/Plan:   1.  Tremor.  -Long discussion with the patient regarding the complex effect that alcohol can have on tremor.  Chronic alcohol use can produce a tremor but discontinuation of the alcohol can also cause a tremulous state for quite some time.  We talked about the importance of weaning alcohol under medical supervision.  We talked about the fact that rapid discontinuation can cause withdrawal seizure, which is why medical supervision is of the upmost importance.    -Patient is doing markedly better on primidone, 50 mg twice per day.  2.    R forearm paresthesias - ? Herpetic neuralgia  -He does have a small rash, but I am not quite convinced that it looks herpetiform, but his symptoms sure sound that way.  Along the C8 dermatome, he complains of severe, burning pain that he woke up with 3 to 4 weeks ago.  -he is already on gabapentin 800 mg tid and Norco, both of which he is on chronically  -I asked the patient to follow-up with his primary care physician ASAP  -Patient believes that he has had the shingles vaccine many years ago but he is going to check with [facility] because it was so long ago.  -We will go ahead and do an EMG of the right upper extremity just to make sure we are not missing an entrapment neuropathy, as he did wake up with it.  He does not think that he slept strangely and the quality/severity of the burning pain in the forearm makes this much less likely.  -Patient is going to try Lidoderm patches over the area, 12 hours on/12 hours off.  Subjective:   [name] was seen in consultation in the movement disorder clinic for follow-up regarding tremor.  Much of tremor was likely related to alcohol, although we felt that he may have had a component of essential tremor.  We started low-dose primidone.  We encouraged him to start weaning his alcohol under medical supervision.  He was also quite depressed due to the loss of his significant other and we provided  counseling resources last visit.  He ended up calling in stating that he had diarrhea on the medication.  We told him that would be very unusual for primidone, but he had also drastically dropped down on his alcohol, which we told him could produce some diarrhea.  We told him to stay off the primidone for a few weeks and then to retry it.  He ended up going back on it and did not have the same difficulties.  He is still drinking alcohol, but he states that it is not as much as it was and generally only when he goes out to eat at [employer].  He is doing okay with primidone 50 mg bid and feels it is controlling tremor well..  Separately, he woke up 3 weeks ago with pain and paresthesias along with right lateral forearm - no rash.  It hurts to wash/touch.  Patient describes the pain as very severe.  He does not think that he slept funny on the arm.    No Known Allergies  Current Meds  Medication Sig   alfuzosin (UROXATRAL) 10 MG 24 hr tablet Take 1 tablet (10 mg total) by mouth at bedtime.   losartan-hydrochlorothiazide (HYZAAR) 50-12.5 MG tablet Take 1 tablet by mouth daily.   metFORMIN (GLUCOPHAGE) 500 MG tablet Take 1 tablet (500 mg total) by mouth 2 (two) times daily with a meal. Takes one tablet  in the AM   Multiple Vitamins-Minerals (MULTIVITAMIN WITH MINERALS) tablet Take 1 tablet by mouth daily.   primidone (MYSOLINE) 50 MG tablet TAKE 1 TABLET(50 MG) BY MOUTH TWICE DAILY   simvastatin (ZOCOR) 40 MG tablet Take 40 mg by mouth at bedtime.   TRADJENTA 5 MG TABS tablet Take 5 mg by mouth daily.      Objective:   VITALS:   Vitals:   04/26/23 1419  BP: (!) 144/86  Pulse: 66  SpO2: 98%  Weight: 183 lb 3.2 oz (83.1 kg)    Gen:  Appears stated age and in NAD. HEENT:  Normocephalic, atraumatic. The mucous membranes are moist. The superficial temporal arteries are without ropiness or tenderness. Cardiovascular: Regular rate and rhythm. Lungs: Clear to auscultation bilaterally. Neck:  There are no carotid bruits noted bilaterally. Skin: There is a single, small forearm lesion that is slightly scabbed over the right, lateral forearm, over the distal radius.  NEUROLOGICAL:  Orientation:  The patient is alert and oriented x 3.   Cranial nerves: There is good facial symmetry. Extraocular muscles are intact and visual fields are full to confrontational testing.  Tone: Tone is good throughout. Sensation: Sensation is intact to light touch touch throughout.  Patient reports significant discomfort, even to light touch, over the right C8 dermatome. Coordination:  The patient has no dysdiadichokinesia or dysmetria. Motor: Strength is at least antigravity x 4. Gait and Station: The patient is able to ambulate without difficulty.   MOVEMENT EXAM: Tremor:  There is no rest tremor.  There is no postural or intention tremor today.  Archimedes spirals are markedly improved. Today's Archimedes spirals:    September, 2024 Archimedes spirals, prior to primidone:    I have reviewed and interpreted the following labs independently   Chemistry      Component Value Date/Time   NA 133 (L) 05/01/2022 0820   NA 141 06/20/2020 1353   K 3.6 05/01/2022 0820   CL 97 (L) 05/01/2022 0820   CO2 25 05/01/2022 0820   BUN 9 05/01/2022 0820   BUN 15 06/20/2020 1353   CREATININE 0.83 05/01/2022 0820      Component Value Date/Time   CALCIUM 9.0 05/01/2022 0820   ALKPHOS 58 05/01/2022 0820   AST 22 05/01/2022 0820   ALT 17 05/01/2022 0820   BILITOT 0.8 05/01/2022 0820   BILITOT 0.8 06/20/2020 1353      Lab Results  Component Value Date   WBC 5.3 05/01/2022   HGB 15.2 05/01/2022   HCT 43.2 05/01/2022   MCV 86.7 05/01/2022   PLT 120 (L) 05/01/2022   No results found for: "TSH"    Total time spent on today's visit was 34 minutes, including both face-to-face time and nonface-to-face time.  Time included that spent on review of records (prior notes available to me/labs/imaging if  pertinent), discussing treatment and goals, answering patient's questions and coordinating care.  CC:  Benetta Spar, MD

## 2023-04-26 ENCOUNTER — Ambulatory Visit: Payer: Medicare HMO | Admitting: Neurology

## 2023-04-26 VITALS — BP 144/86 | HR 66 | Wt 183.2 lb

## 2023-04-26 DIAGNOSIS — R251 Tremor, unspecified: Secondary | ICD-10-CM

## 2023-04-26 DIAGNOSIS — F1090 Alcohol use, unspecified, uncomplicated: Secondary | ICD-10-CM

## 2023-04-26 DIAGNOSIS — R202 Paresthesia of skin: Secondary | ICD-10-CM | POA: Diagnosis not present

## 2023-04-26 NOTE — Patient Instructions (Signed)
 Find out from [facility] if your shingles vaccine is up to date We will get your EMG scheduled (this is a nerve test) Follow up with your primary care doctor and tell them that we suspect you could have a shingles (without the rash) at the C8 dermatome Get some lidocaine patches and put them on your arm and wear them 12 hours on and 12 hours off

## 2023-04-30 DIAGNOSIS — J410 Simple chronic bronchitis: Secondary | ICD-10-CM | POA: Diagnosis not present

## 2023-04-30 DIAGNOSIS — I10 Essential (primary) hypertension: Secondary | ICD-10-CM | POA: Diagnosis not present

## 2023-04-30 DIAGNOSIS — E1142 Type 2 diabetes mellitus with diabetic polyneuropathy: Secondary | ICD-10-CM | POA: Diagnosis not present

## 2023-04-30 DIAGNOSIS — R197 Diarrhea, unspecified: Secondary | ICD-10-CM | POA: Diagnosis not present

## 2023-05-01 DIAGNOSIS — Z0001 Encounter for general adult medical examination with abnormal findings: Secondary | ICD-10-CM | POA: Diagnosis not present

## 2023-05-01 DIAGNOSIS — I10 Essential (primary) hypertension: Secondary | ICD-10-CM | POA: Diagnosis not present

## 2023-05-01 DIAGNOSIS — E1142 Type 2 diabetes mellitus with diabetic polyneuropathy: Secondary | ICD-10-CM | POA: Diagnosis not present

## 2023-05-01 DIAGNOSIS — F1020 Alcohol dependence, uncomplicated: Secondary | ICD-10-CM | POA: Diagnosis not present

## 2023-05-03 ENCOUNTER — Telehealth: Payer: Self-pay | Admitting: Neurology

## 2023-05-03 NOTE — Telephone Encounter (Signed)
 Called pateint left message about shingles shot

## 2023-05-03 NOTE — Telephone Encounter (Signed)
 Pt called in to give [place] the name of his shingles shot. It is Shingrix.

## 2023-05-13 DIAGNOSIS — Z85828 Personal history of other malignant neoplasm of skin: Secondary | ICD-10-CM | POA: Diagnosis not present

## 2023-05-13 DIAGNOSIS — Z08 Encounter for follow-up examination after completed treatment for malignant neoplasm: Secondary | ICD-10-CM | POA: Diagnosis not present

## 2023-05-27 ENCOUNTER — Ambulatory Visit (INDEPENDENT_AMBULATORY_CARE_PROVIDER_SITE_OTHER): Admitting: Neurology

## 2023-05-27 DIAGNOSIS — G5601 Carpal tunnel syndrome, right upper limb: Secondary | ICD-10-CM

## 2023-05-27 DIAGNOSIS — R202 Paresthesia of skin: Secondary | ICD-10-CM

## 2023-05-27 NOTE — Procedures (Signed)
  [facility]  [address] [place], [address]  [place], [place] [address] Tel: [phone] Fax: [phone] Test Date:  05/27/2023  Patient: [name] DOB: [birth year] Physician: Rommie Coats, MD  Sex: Male Height: 5\' 11"  Ref Phys: Fran Imus, DO  ID#: [id number]   Technician:    History: This is an 83 year old male with right arm pain and paresthesias.   NCV & EMG Findings: Extensive electrodiagnostic evaluation of the right upper limb shows: Right median sensory response shows prolonged distal peak latency (4.4 ms). Right ulnar and radial sensory responses are within normal limits. Right median (APB) and ulnar (ADM) motor responses are within normal limits. There is no evidence of active or chronic motor axon loss changes affecting any of the tested muscles on needle examination. Motor unit configuration and recruitment pattern is within normal limits.  Impression: This is an abnormal study. The findings are most consistent with the following: Evidence of a right median mononeuropathy at or distal to the wrist, consistent with carpal tunnel syndrome, mild in degree electrically. No electrodiagnostic evidence of a right cervical (C5-C8) motor radiculopathy. Screening studies for right ulnar or radial mononeuropathies are normal.    ___________________________ Rommie Coats, MD    Nerve Conduction Studies Motor Nerve Results    Latency Amplitude F-Lat Segment Distance CV Comment  Site (ms) Norm (mV) Norm (ms)  (cm) (m/s) Norm   Right Median (APB) Motor  Wrist 3.7  < 4.0 5.0  > 5.0        Elbow 9.8 - 4.6 -  Elbow-Wrist 31 51  > 50   Right Ulnar (ADM) Motor  Wrist 2.5  < 3.1 7.8  > 7.0        Bel elbow 6.6 - 7.3 -  Bel elbow-Wrist 21.5 52  > 50   Ab elbow 8.6 - 7.2 -  Ab elbow-Bel elbow 10 50 -    Sensory Sites    Neg Peak Lat Amplitude (O-P) Segment Distance Velocity Comment  Site (ms) Norm (V) Norm  (cm) (ms)   Right Median Sensory  Wrist-Dig II *4.4  <  3.8 15  > 10 Wrist-Dig II 13    Right Radial Sensory  Forearm-Wrist 2.3  < 2.8 16  > 10 Forearm-Wrist 10    Right Ulnar Sensory  Wrist-Dig V 3.1  < 3.2 17  > 5 Wrist-Dig V 11     Electromyography   Side Muscle Ins.Act Fibs Fasc Recrt Amp Dur Poly Activation Comment  Right FDI Nml Nml Nml Nml Nml Nml Nml Nml N/A  Right EIP Nml Nml Nml Nml Nml Nml Nml Nml N/A  Right Pronator teres Nml Nml Nml Nml Nml Nml Nml Nml N/A  Right Biceps Nml Nml Nml Nml Nml Nml Nml Nml N/A  Right Triceps Nml Nml Nml Nml Nml Nml Nml Nml N/A  Right Deltoid Nml Nml Nml Nml Nml Nml Nml Nml N/A      Waveforms:  Motor      Sensory

## 2023-05-31 DIAGNOSIS — E1142 Type 2 diabetes mellitus with diabetic polyneuropathy: Secondary | ICD-10-CM | POA: Diagnosis not present

## 2023-05-31 DIAGNOSIS — I10 Essential (primary) hypertension: Secondary | ICD-10-CM | POA: Diagnosis not present

## 2023-06-02 ENCOUNTER — Other Ambulatory Visit: Payer: Self-pay | Admitting: Neurology

## 2023-06-10 DIAGNOSIS — M5030 Other cervical disc degeneration, unspecified cervical region: Secondary | ICD-10-CM | POA: Diagnosis not present

## 2023-06-10 DIAGNOSIS — M5416 Radiculopathy, lumbar region: Secondary | ICD-10-CM | POA: Diagnosis not present

## 2023-06-24 ENCOUNTER — Telehealth: Payer: Self-pay | Admitting: Neurology

## 2023-06-24 NOTE — Telephone Encounter (Signed)
 Called patient and left a message for a call back.

## 2023-06-24 NOTE — Telephone Encounter (Signed)
 Pt called and LM. He doesn't understand why he has not heard from anyone regarding his EMG with Dr.Hill in April. He wants to speak with someone about this

## 2023-06-25 NOTE — Telephone Encounter (Signed)
Pt is returning a call to [facility]

## 2023-06-25 NOTE — Telephone Encounter (Signed)
 Pt called back in to get his results

## 2023-06-26 NOTE — Telephone Encounter (Signed)
 Called patient and left a message for a call back.

## 2023-06-30 DIAGNOSIS — E1142 Type 2 diabetes mellitus with diabetic polyneuropathy: Secondary | ICD-10-CM | POA: Diagnosis not present

## 2023-06-30 DIAGNOSIS — I10 Essential (primary) hypertension: Secondary | ICD-10-CM | POA: Diagnosis not present

## 2023-07-04 NOTE — Telephone Encounter (Signed)
 Pt called back in and left a message. He wants to talk about his results. He says the best time to reach him is early in the morning.

## 2023-07-04 NOTE — Telephone Encounter (Signed)
 Pt called and LM. He would like a call back today. He said we keep calling him and he keeps calling us  with no luck.

## 2023-07-05 NOTE — Telephone Encounter (Signed)
 Patient returned call and was informed of results and recommendation per Dr. Winferd Hatter:  Let pt know that he does have mild right carpal tunnel syndrome but I spoke with Dr. Genita Keys and both of us  think that this is a "red herring" and if he's still having that forearm numbness/burning, he should f/u pcp    Patient verbalized understanding of all explained to him and had no further questions or concerns.

## 2023-07-05 NOTE — Telephone Encounter (Signed)
 Called patient and left a message for a call back.

## 2023-07-24 ENCOUNTER — Other Ambulatory Visit: Payer: Self-pay

## 2023-07-24 DIAGNOSIS — R251 Tremor, unspecified: Secondary | ICD-10-CM

## 2023-07-24 MED ORDER — PRIMIDONE 50 MG PO TABS: 50.0000 mg | ORAL_TABLET | Freq: Two times a day (BID) | ORAL | 0 refills | Status: DC

## 2023-07-25 ENCOUNTER — Other Ambulatory Visit: Payer: Self-pay

## 2023-07-25 DIAGNOSIS — E1142 Type 2 diabetes mellitus with diabetic polyneuropathy: Secondary | ICD-10-CM | POA: Diagnosis not present

## 2023-07-25 DIAGNOSIS — N138 Other obstructive and reflux uropathy: Secondary | ICD-10-CM

## 2023-07-25 DIAGNOSIS — N400 Enlarged prostate without lower urinary tract symptoms: Secondary | ICD-10-CM | POA: Diagnosis not present

## 2023-07-25 DIAGNOSIS — M1990 Unspecified osteoarthritis, unspecified site: Secondary | ICD-10-CM | POA: Diagnosis not present

## 2023-07-25 DIAGNOSIS — F1020 Alcohol dependence, uncomplicated: Secondary | ICD-10-CM | POA: Diagnosis not present

## 2023-07-25 DIAGNOSIS — I10 Essential (primary) hypertension: Secondary | ICD-10-CM | POA: Diagnosis not present

## 2023-07-25 DIAGNOSIS — M5030 Other cervical disc degeneration, unspecified cervical region: Secondary | ICD-10-CM | POA: Diagnosis not present

## 2023-07-25 MED ORDER — ALFUZOSIN HCL ER 10 MG PO TB24: 10.0000 mg | ORAL_TABLET | Freq: Every day | ORAL | 1 refills | Status: DC

## 2023-07-30 DIAGNOSIS — H524 Presbyopia: Secondary | ICD-10-CM | POA: Diagnosis not present

## 2023-07-31 ENCOUNTER — Other Ambulatory Visit: Payer: Self-pay

## 2023-07-31 ENCOUNTER — Telehealth: Payer: Self-pay | Admitting: Neurology

## 2023-07-31 DIAGNOSIS — R251 Tremor, unspecified: Secondary | ICD-10-CM

## 2023-07-31 MED ORDER — PRIMIDONE 50 MG PO TABS: 50.0000 mg | ORAL_TABLET | Freq: Two times a day (BID) | ORAL | 0 refills | Status: DC

## 2023-07-31 NOTE — Telephone Encounter (Signed)
 RX sent

## 2023-07-31 NOTE — Telephone Encounter (Signed)
 Pharmacy calling for refill of Rx primidone  (MYSOLINE ) 50 MG tablet

## 2023-08-19 DIAGNOSIS — M79605 Pain in left leg: Secondary | ICD-10-CM | POA: Diagnosis not present

## 2023-08-19 DIAGNOSIS — M5416 Radiculopathy, lumbar region: Secondary | ICD-10-CM | POA: Diagnosis not present

## 2023-08-22 DIAGNOSIS — I10 Essential (primary) hypertension: Secondary | ICD-10-CM | POA: Diagnosis not present

## 2023-08-22 DIAGNOSIS — E1142 Type 2 diabetes mellitus with diabetic polyneuropathy: Secondary | ICD-10-CM | POA: Diagnosis not present

## 2023-08-24 DIAGNOSIS — E1142 Type 2 diabetes mellitus with diabetic polyneuropathy: Secondary | ICD-10-CM | POA: Diagnosis not present

## 2023-08-24 DIAGNOSIS — I10 Essential (primary) hypertension: Secondary | ICD-10-CM | POA: Diagnosis not present

## 2023-09-05 DIAGNOSIS — I10 Essential (primary) hypertension: Secondary | ICD-10-CM | POA: Diagnosis not present

## 2023-09-05 DIAGNOSIS — E1169 Type 2 diabetes mellitus with other specified complication: Secondary | ICD-10-CM | POA: Diagnosis not present

## 2023-09-08 ENCOUNTER — Other Ambulatory Visit: Payer: Self-pay | Admitting: Neurology

## 2023-09-08 DIAGNOSIS — R251 Tremor, unspecified: Secondary | ICD-10-CM

## 2023-09-20 DIAGNOSIS — Z1331 Encounter for screening for depression: Secondary | ICD-10-CM | POA: Diagnosis not present

## 2023-09-20 DIAGNOSIS — I10 Essential (primary) hypertension: Secondary | ICD-10-CM | POA: Diagnosis not present

## 2023-09-20 DIAGNOSIS — E1142 Type 2 diabetes mellitus with diabetic polyneuropathy: Secondary | ICD-10-CM | POA: Diagnosis not present

## 2023-09-20 DIAGNOSIS — N400 Enlarged prostate without lower urinary tract symptoms: Secondary | ICD-10-CM | POA: Diagnosis not present

## 2023-09-20 DIAGNOSIS — Z0001 Encounter for general adult medical examination with abnormal findings: Secondary | ICD-10-CM | POA: Diagnosis not present

## 2023-09-20 DIAGNOSIS — M1990 Unspecified osteoarthritis, unspecified site: Secondary | ICD-10-CM | POA: Diagnosis not present

## 2023-09-20 DIAGNOSIS — F1020 Alcohol dependence, uncomplicated: Secondary | ICD-10-CM | POA: Diagnosis not present

## 2023-09-20 DIAGNOSIS — Z1389 Encounter for screening for other disorder: Secondary | ICD-10-CM | POA: Diagnosis not present

## 2023-10-10 DIAGNOSIS — M5416 Radiculopathy, lumbar region: Secondary | ICD-10-CM | POA: Diagnosis not present

## 2023-10-10 DIAGNOSIS — Z5181 Encounter for therapeutic drug level monitoring: Secondary | ICD-10-CM | POA: Diagnosis not present

## 2023-10-10 DIAGNOSIS — M5030 Other cervical disc degeneration, unspecified cervical region: Secondary | ICD-10-CM | POA: Diagnosis not present

## 2023-10-21 DIAGNOSIS — I10 Essential (primary) hypertension: Secondary | ICD-10-CM | POA: Diagnosis not present

## 2023-10-21 DIAGNOSIS — E1142 Type 2 diabetes mellitus with diabetic polyneuropathy: Secondary | ICD-10-CM | POA: Diagnosis not present

## 2023-10-28 NOTE — Progress Notes (Deleted)
 Assessment/Plan:   1.  Tremor.  -Long discussion with the patient regarding the complex effect that alcohol  can have on tremor.  Chronic alcohol  use can produce a tremor but discontinuation of the alcohol  can also cause a tremulous state for quite some time.  We talked about the importance of weaning alcohol  under medical supervision.  We talked about the fact that rapid discontinuation can cause withdrawal seizure, which is why medical supervision is of the upmost importance.    -Patient is doing markedly better on primidone , 50 mg twice per day.  2.    R forearm paresthesias - ? Herpetic neuralgia  -He does have a small rash, but I am not quite convinced that it looks herpetiform, but his symptoms sure sound that way.  Along the C8 dermatome, he complains of severe, burning pain that he woke up with 3 to 4 weeks ago.  -he is already on gabapentin  800 mg tid and Norco, both of which he is on chronically  -I asked the patient to follow-up with his primary care physician ASAP  -Patient believes that he has had the shingles vaccine many years ago but he is going to check with [facility] because it was so long ago.  -We will go ahead and do an EMG of the right upper extremity just to make sure we are not missing an entrapment neuropathy, as he did wake up with it.  He does not think that he slept strangely and the quality/severity of the burning pain in the forearm makes this much less likely.  -Patient is going to try Lidoderm patches over the area, 12 hours on/12 hours off.  Subjective:   [name] was seen in consultation in the movement disorder clinic for follow-up regarding tremor.  Last visit, patient was doing fairly well and primidone , 50 mg twice per day.  We have talked about weaning alcohol  safely.  Last visit, he was describing forearm pain.  We did an EMG that was essentially unremarkable (mild carpal tunnel).  I thought he could perhaps be having some early herpetic neuralgia, as  he was describing pain that was far out of proportion to the degree of carpal tunnel.  I told him he did need to follow-up with primary care.  His primary care physician is not in our system, so no notes are available in that regard.  He has been following with orthopedics regarding left leg pain, Dr. Bonner.  Patient is on oxycodone.    No Known Allergies  No outpatient medications have been marked as taking for the 10/29/23 encounter (Appointment) with Aariyah Sampey, Asberry RAMAN, DO.      Objective:   VITALS:   There were no vitals filed for this visit.   Gen:  Appears stated age and in NAD. HEENT:  Normocephalic, atraumatic. The mucous membranes are moist. The superficial temporal arteries are without ropiness or tenderness. Cardiovascular: Regular rate and rhythm. Lungs: Clear to auscultation bilaterally. Neck: There are no carotid bruits noted bilaterally. Skin: There is a single, small forearm lesion that is slightly scabbed over the right, lateral forearm, over the distal radius.  NEUROLOGICAL:  Orientation:  The patient is alert and oriented x 3.   Cranial nerves: There is good facial symmetry. Extraocular muscles are intact and visual fields are full to confrontational testing.  Tone: Tone is good throughout. Sensation: Sensation is intact to light touch touch throughout.  Patient reports significant discomfort, even to light touch, over the right C8 dermatome. Coordination:  The  patient has no dysdiadichokinesia or dysmetria. Motor: Strength is at least antigravity x 4. Gait and Station: The patient is able to ambulate without difficulty.   MOVEMENT EXAM: Tremor:  There is no rest tremor.  There is no postural or intention tremor today.  Archimedes spirals are markedly improved. Today's Archimedes spirals:    September, 2024 Archimedes spirals, prior to primidone :    I have reviewed and interpreted the following labs independently   Chemistry      Component Value Date/Time    NA 133 (L) 05/01/2022 0820   NA 141 06/20/2020 1353   K 3.6 05/01/2022 0820   CL 97 (L) 05/01/2022 0820   CO2 25 05/01/2022 0820   BUN 9 05/01/2022 0820   BUN 15 06/20/2020 1353   CREATININE 0.83 05/01/2022 0820      Component Value Date/Time   CALCIUM 9.0 05/01/2022 0820   ALKPHOS 58 05/01/2022 0820   AST 22 05/01/2022 0820   ALT 17 05/01/2022 0820   BILITOT 0.8 05/01/2022 0820   BILITOT 0.8 06/20/2020 1353      Lab Results  Component Value Date   WBC 5.3 05/01/2022   HGB 15.2 05/01/2022   HCT 43.2 05/01/2022   MCV 86.7 05/01/2022   PLT 120 (L) 05/01/2022   No results found for: TSH    Total time spent on today's visit was *** minutes, including both face-to-face time and nonface-to-face time.  Time included that spent on review of records (prior notes available to me/labs/imaging if pertinent), discussing treatment and goals, answering patient's questions and coordinating care.  CC:  Carlette Benita Area, MD

## 2023-10-29 ENCOUNTER — Encounter: Payer: Self-pay | Admitting: Neurology

## 2023-10-29 ENCOUNTER — Ambulatory Visit: Admitting: Neurology

## 2023-11-14 ENCOUNTER — Other Ambulatory Visit: Payer: Self-pay | Admitting: Neurology

## 2023-11-14 DIAGNOSIS — R251 Tremor, unspecified: Secondary | ICD-10-CM

## 2023-11-15 ENCOUNTER — Telehealth: Payer: Self-pay

## 2023-11-15 NOTE — Telephone Encounter (Signed)
 Patient no showed 10/29/23 appointment that was made in order to do a refill for him. I have another refill request and will need patient to schedule appointment and come to appointment before receiving a refill. Please call and see if we can get him in soon

## 2023-11-15 NOTE — Telephone Encounter (Signed)
 Sent up front for scheduling patient no showed last appointment that was made for a refill

## 2023-11-20 DIAGNOSIS — E1142 Type 2 diabetes mellitus with diabetic polyneuropathy: Secondary | ICD-10-CM | POA: Diagnosis not present

## 2023-11-20 DIAGNOSIS — I10 Essential (primary) hypertension: Secondary | ICD-10-CM | POA: Diagnosis not present

## 2023-12-02 DIAGNOSIS — Z08 Encounter for follow-up examination after completed treatment for malignant neoplasm: Secondary | ICD-10-CM | POA: Diagnosis not present

## 2023-12-02 DIAGNOSIS — Z85828 Personal history of other malignant neoplasm of skin: Secondary | ICD-10-CM | POA: Diagnosis not present

## 2023-12-02 DIAGNOSIS — L570 Actinic keratosis: Secondary | ICD-10-CM | POA: Diagnosis not present

## 2023-12-02 DIAGNOSIS — X32XXXD Exposure to sunlight, subsequent encounter: Secondary | ICD-10-CM | POA: Diagnosis not present

## 2023-12-11 NOTE — Progress Notes (Signed)
 Assessment/Plan:   1.  Tremor.  -Long discussion with the patient regarding the complex effect that alcohol  can have on tremor.  Chronic alcohol  use can produce a tremor but discontinuation of the alcohol  can also cause a tremulous state for quite some time.  We talked about the importance of weaning alcohol  under medical supervision.  We talked about the fact that rapid discontinuation can cause withdrawal seizure, which is why medical supervision is of the upmost importance.    -continue Primidone , 50 mg twice per day  2.  Lumbar radiculopathy  - Follows with Dr. Bonner  Subjective:   [name] was seen in consultation in the movement disorder clinic for follow-up regarding tremor.  Last visit, patient was doing fairly well and primidone , 50 mg twice per day.  Its doing okay.  Shaking has improved a lot.  He doesn't spill drinks but he does not tremor when holding it in the L hand.  We have talked about weaning alcohol  safely.  He generally drinks 6 beers/day.  Last visit, he was describing forearm pain.  We did an EMG that was essentially unremarkable (mild carpal tunnel).  I thought he could perhaps be having some early herpetic neuralgia, as he was describing pain that was severe and burning in quality.  I did ask him to follow-up with his primary care physician.  I do not have any notes about that.  He states that he still has that pain but if he wears a long sleeve shirt it helps.  If he puts lotion on it, it helps.  Its not a severe pain now, but rather a numbness.  He did miss an appointment with me back in September.  He has been following with Dr. Gifford regarding lumbar radicular pain.  No Known Allergies  Current Meds  Medication Sig   alfuzosin  (UROXATRAL ) 10 MG 24 hr tablet Take 1 tablet (10 mg total) by mouth at bedtime.   gabapentin  (NEURONTIN ) 800 MG tablet Take 800 mg by mouth 3 (three) times daily.   HYDROcodone -acetaminophen  (NORCO) 10-325 MG tablet Take 1 tablet by  mouth 4 (four) times daily as needed.   losartan -hydrochlorothiazide  (HYZAAR) 50-12.5 MG tablet Take 1 tablet by mouth daily.   metFORMIN  (GLUCOPHAGE ) 500 MG tablet Take 1 tablet (500 mg total) by mouth 2 (two) times daily with a meal. Takes one tablet in the AM   metoprolol  succinate (TOPROL -XL) 50 MG 24 hr tablet Take 50 mg by mouth daily.   Multiple Vitamins-Minerals (MULTIVITAMIN WITH MINERALS) tablet Take 1 tablet by mouth daily.   primidone  (MYSOLINE ) 50 MG tablet TAKE 1 TABLET TWICE DAILY   simvastatin  (ZOCOR ) 40 MG tablet Take 40 mg by mouth at bedtime.   TRADJENTA 5 MG TABS tablet Take 5 mg by mouth daily.    Objective:   VITALS:   Vitals:   12/16/23 0808  BP: (!) 158/86  Pulse: 85  SpO2: 98%  Weight: 182 lb 9.6 oz (82.8 kg)  Height: 5' 10 (1.778 m)    Gen:  Appears stated age and in NAD. HEENT:  Normocephalic, atraumatic. The mucous membranes are moist. The superficial temporal arteries are without ropiness or tenderness. Cardiovascular: Regular rate and rhythm. Lungs: Clear to auscultation bilaterally. Neck: There are no carotid bruits noted bilaterally.  NEUROLOGICAL:  Orientation:  The patient is alert and oriented x 3.   Cranial nerves: There is good facial symmetry. Extraocular muscles are intact and visual fields are full to confrontational testing.  Tone: Tone  is good throughout. Sensation: Sensation is intact to light touch touch throughout.   Coordination:  The patient has no dysdiadichokinesia or dysmetria. Motor: Strength is at least antigravity x 4. Gait and Station: The patient is able to ambulate without difficulty.   MOVEMENT EXAM: Tremor:  There is no rest tremor.  There is no postural or intention tremor today.  Archimedes spirals are markedly improved compared to initial and stable compared to prior  I have reviewed and interpreted the following labs independently   Chemistry      Component Value Date/Time   NA 133 (L) 05/01/2022 0820   NA  141 06/20/2020 1353   K 3.6 05/01/2022 0820   CL 97 (L) 05/01/2022 0820   CO2 25 05/01/2022 0820   BUN 9 05/01/2022 0820   BUN 15 06/20/2020 1353   CREATININE 0.83 05/01/2022 0820      Component Value Date/Time   CALCIUM 9.0 05/01/2022 0820   ALKPHOS 58 05/01/2022 0820   AST 22 05/01/2022 0820   ALT 17 05/01/2022 0820   BILITOT 0.8 05/01/2022 0820   BILITOT 0.8 06/20/2020 1353      Lab Results  Component Value Date   WBC 5.3 05/01/2022   HGB 15.2 05/01/2022   HCT 43.2 05/01/2022   MCV 86.7 05/01/2022   PLT 120 (L) 05/01/2022   No results found for: TSH      CC:  Fanta, Tesfaye Demissie, MD

## 2023-12-12 ENCOUNTER — Telehealth: Payer: Self-pay | Admitting: Neurology

## 2023-12-12 NOTE — Telephone Encounter (Signed)
 [name] called in wanting to change his appt. He said he is having trouble getting here due to car trouble. He will try his best to get here on the 10th at 8:45am. I did put him on the wait list in case something came available. I let him know Dr.tat's next avail date is not until 05/2024

## 2023-12-13 NOTE — Telephone Encounter (Signed)
 Called patient and was disconnected called back no answer

## 2023-12-16 ENCOUNTER — Ambulatory Visit: Admitting: Neurology

## 2023-12-16 VITALS — BP 158/86 | HR 85 | Ht 70.0 in | Wt 182.6 lb

## 2023-12-16 DIAGNOSIS — R251 Tremor, unspecified: Secondary | ICD-10-CM | POA: Diagnosis not present

## 2023-12-16 DIAGNOSIS — F1090 Alcohol use, unspecified, uncomplicated: Secondary | ICD-10-CM

## 2023-12-24 DIAGNOSIS — E1142 Type 2 diabetes mellitus with diabetic polyneuropathy: Secondary | ICD-10-CM | POA: Diagnosis not present

## 2023-12-24 DIAGNOSIS — I10 Essential (primary) hypertension: Secondary | ICD-10-CM | POA: Diagnosis not present

## 2024-01-08 ENCOUNTER — Telehealth: Payer: Self-pay

## 2024-01-08 NOTE — Telephone Encounter (Signed)
 Return call to pt to confirmed appointment.

## 2024-01-15 ENCOUNTER — Ambulatory Visit: Payer: Medicare HMO | Admitting: Urology

## 2024-01-15 VITALS — BP 157/73 | HR 79

## 2024-01-15 DIAGNOSIS — R339 Retention of urine, unspecified: Secondary | ICD-10-CM

## 2024-01-15 DIAGNOSIS — N138 Other obstructive and reflux uropathy: Secondary | ICD-10-CM

## 2024-01-15 DIAGNOSIS — N5201 Erectile dysfunction due to arterial insufficiency: Secondary | ICD-10-CM | POA: Diagnosis not present

## 2024-01-15 DIAGNOSIS — R338 Other retention of urine: Secondary | ICD-10-CM

## 2024-01-15 DIAGNOSIS — N401 Enlarged prostate with lower urinary tract symptoms: Secondary | ICD-10-CM

## 2024-01-15 LAB — BLADDER SCAN AMB NON-IMAGING
Scan Result: 128
Scan Result: 258

## 2024-01-15 MED ORDER — FINASTERIDE 5 MG PO TABS: 5.0000 mg | ORAL_TABLET | Freq: Every day | ORAL | 3 refills | Status: AC

## 2024-01-15 MED ORDER — TADALAFIL 20 MG PO TABS: 20.0000 mg | ORAL_TABLET | ORAL | 5 refills | Status: AC | PRN

## 2024-01-15 MED ORDER — ALFUZOSIN HCL ER 10 MG PO TB24: 10.0000 mg | ORAL_TABLET | Freq: Two times a day (BID) | ORAL | 3 refills | Status: DC

## 2024-01-15 NOTE — Progress Notes (Unsigned)
 01/15/2024 2:29 PM   [name] Jun 28, 1940 [id number]  Referring provider: Carlette Benita Area, MD [address] [place],  [place] [address]  Followup BPH   HPI: [name] is a 83yo here for followup for BPH and erectile dysfunction. IPSS 8 QOL 3 on uroxatral  10mg  daily and rapaflo  8mg  daily. Uirne stream is fair. No straining to urinate. He notes increased urinary frequency since last visit. Urinary frequency every 1-2 hours.    PMH: Past Medical History:  Diagnosis Date   Diabetes mellitus    A1c of 7 -12/2009   DJD (degenerative joint disease)    left knee,cervical spine,lumbosacral spine   DVT (deep venous thrombosis) (HCC) 12/06/2009   left lower extremity  following left TKA   Hyperlipidemia    Hypertension    Obesity    Skin cancer    both arms    Surgical History: Past Surgical History:  Procedure Laterality Date   CERVICAL DISCECTOMY     and fusion   COLONOSCOPY  08/15/2011   Procedure: COLONOSCOPY;  Surgeon: Nicholas CHRISTELLA Hollingshead, MD;  Location: [facility];  Service: Endoscopy;  Laterality: N/A;  11:15 AM   COLONOSCOPY W/ POLYPECTOMY  2003   TOTAL KNEE ARTHROPLASTY  2011   TOTAL KNEE ARTHROPLASTY  03/2008   TOTAL SHOULDER ARTHROPLASTY  05/2009   right     Home Medications:  Allergies as of 01/15/2024   No Known Allergies      Medication List        Accurate as of January 15, 2024  2:29 PM. If you have any questions, ask your nurse or doctor.          alfuzosin  10 MG 24 hr tablet Commonly known as: UROXATRAL  Take 1 tablet (10 mg total) by mouth at bedtime.   gabapentin  800 MG tablet Commonly known as: NEURONTIN  Take 800 mg by mouth 3 (three) times daily.   HYDROcodone -acetaminophen  10-325 MG tablet Commonly known as: NORCO Take 1 tablet by mouth 4 (four) times daily as needed.   losartan -hydrochlorothiazide  50-12.5 MG tablet Commonly known as: HYZAAR Take 1 tablet by mouth daily.   metFORMIN  500 MG tablet Commonly  known as: GLUCOPHAGE  Take 1 tablet (500 mg total) by mouth 2 (two) times daily with a meal. Takes one tablet in the AM   metoprolol  succinate 50 MG 24 hr tablet Commonly known as: TOPROL -XL Take 50 mg by mouth daily.   multivitamin with minerals tablet Take 1 tablet by mouth daily.   primidone  50 MG tablet Commonly known as: MYSOLINE  TAKE 1 TABLET TWICE DAILY   simvastatin  40 MG tablet Commonly known as: ZOCOR  Take 40 mg by mouth at bedtime.   Tradjenta 5 MG Tabs tablet Generic drug: linagliptin Take 5 mg by mouth daily.        Allergies: No Known Allergies  Family History: Family History  Problem Relation Age of Onset   Stroke Mother    Cancer - Lung Father    Cancer - Lung Sister    Lung cancer Brother        smoked   Cancer - Lung Brother    Cancer - Lung Brother    Cancer - Lung Brother    Cancer - Lung Brother    Tremor Neg Hx     Social History:  reports that he has never smoked. He has never used smokeless tobacco. He reports current alcohol  use of about 12.0 - 24.0 standard drinks of alcohol  per week. He  reports that he does not use drugs.  ROS: All other review of systems were reviewed and are negative except what is noted above in HPI  Physical Exam: BP (!) 157/73   Pulse 79   Constitutional:  Alert and oriented, No acute distress. HEENT:  AT, moist mucus membranes.  Trachea midline, no masses. Cardiovascular: No clubbing, cyanosis, or edema. Respiratory: Normal respiratory effort, no increased work of breathing. GI: Abdomen is soft, nontender, nondistended, no abdominal masses GU: No CVA tenderness.  Lymph: No cervical or inguinal lymphadenopathy. Skin: No rashes, bruises or suspicious lesions. Neurologic: Grossly intact, no focal deficits, moving all 4 extremities. Psychiatric: Normal mood and affect.  Laboratory Data: Lab Results  Component Value Date   WBC 5.3 05/01/2022   HGB 15.2 05/01/2022   HCT 43.2 05/01/2022   MCV 86.7  05/01/2022   PLT 120 (L) 05/01/2022    Lab Results  Component Value Date   CREATININE 0.83 05/01/2022    No results found for: PSA  No results found for: TESTOSTERONE  Lab Results  Component Value Date   HGBA1C 8.0 (H) 01/02/2020    Urinalysis    Component Value Date/Time   COLORURINE YELLOW 12/23/2009 0151   APPEARANCEUR Clear 01/14/2023 1447   LABSPEC 1.020 12/23/2009 0151   PHURINE 7.5 12/23/2009 0151   GLUCOSEU Negative 01/14/2023 1447   HGBUR NEGATIVE 12/23/2009 0151   BILIRUBINUR Negative 01/14/2023 1447   KETONESUR 40 (A) 12/23/2009 0151   PROTEINUR 2+ (A) 01/14/2023 1447   PROTEINUR NEGATIVE 12/23/2009 0151   UROBILINOGEN 1.0 12/23/2009 0151   NITRITE Negative 01/14/2023 1447   NITRITE NEGATIVE 12/23/2009 0151   LEUKOCYTESUR Negative 01/14/2023 1447    Lab Results  Component Value Date   LABMICR See below: 01/14/2023   WBCUA None seen 01/14/2023   LABEPIT 0-10 01/14/2023   BACTERIA None seen 01/14/2023    Pertinent Imaging: *** No results found for this or any previous visit.  No results found for this or any previous visit.  No results found for this or any previous visit.  No results found for this or any previous visit.  No results found for this or any previous visit.  No results found for this or any previous visit.  No results found for this or any previous visit.  No results found for this or any previous visit.   Assessment & Plan:    1. 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 Incomplete bladder emptying ***   No follow-ups on file.  Belvie Clara, MD  [facility] [place]  \\\\\\\

## 2024-01-15 NOTE — Progress Notes (Unsigned)
° °  Patient cannot void prior to the bladder scan. Bladder scan result: 258  Performed By: [facility] LPN  Patient voided 130 ml  Pvr=[id number]

## 2024-01-16 LAB — URINALYSIS, ROUTINE W REFLEX MICROSCOPIC
Bilirubin, UA: NEGATIVE
Glucose, UA: NEGATIVE
Ketones, UA: NEGATIVE
Leukocytes,UA: NEGATIVE
Nitrite, UA: NEGATIVE
Protein,UA: NEGATIVE
RBC, UA: NEGATIVE
Specific Gravity, UA: 1.01 (ref 1.005–1.030)
Urobilinogen, Ur: 0.2 mg/dL (ref 0.2–1.0)
pH, UA: 6 (ref 5.0–7.5)

## 2024-01-16 LAB — PSA: Prostate Specific Ag, Serum: 14.5 ng/mL — ABNORMAL HIGH (ref 0.0–4.0)

## 2024-01-28 ENCOUNTER — Encounter: Payer: Self-pay | Admitting: Urology

## 2024-01-28 NOTE — Patient Instructions (Signed)

## 2024-02-19 ENCOUNTER — Telehealth: Payer: Self-pay | Admitting: Neurology

## 2024-02-19 DIAGNOSIS — R251 Tremor, unspecified: Secondary | ICD-10-CM

## 2024-02-19 MED ORDER — PRIMIDONE 50 MG PO TABS: 50.0000 mg | ORAL_TABLET | Freq: Two times a day (BID) | ORAL | 2 refills | Status: AC

## 2024-02-19 NOTE — Telephone Encounter (Signed)
 Pt called and LM with AN. Did not mention which medication. He needs a refill of his medication Tat has him on.  [facility] [place] [address]

## 2024-02-19 NOTE — Telephone Encounter (Signed)
 No answer. Unable to LVM.  Refills sent.

## 2024-02-22 ENCOUNTER — Other Ambulatory Visit: Payer: Self-pay | Admitting: Urology

## 2024-02-22 DIAGNOSIS — N138 Other obstructive and reflux uropathy: Secondary | ICD-10-CM

## 2024-02-26 ENCOUNTER — Telehealth: Payer: Self-pay

## 2024-02-26 DIAGNOSIS — N401 Enlarged prostate with lower urinary tract symptoms: Secondary | ICD-10-CM

## 2024-02-26 MED ORDER — ALFUZOSIN HCL ER 10 MG PO TB24: 10.0000 mg | ORAL_TABLET | Freq: Every day | ORAL | 3 refills | Status: DC

## 2024-02-26 MED ORDER — SILODOSIN 8 MG PO CAPS: 8.0000 mg | ORAL_CAPSULE | Freq: Every day | ORAL | 3 refills | Status: AC

## 2024-02-26 NOTE — Telephone Encounter (Signed)
 Pt called in today and state's his medication is not working,  Aluzosin 10 mg is not working, industrial/product designer form Dr. Sherrilee to D/C alfuzsoin, send in rapaflo  8 mg  and finasteride . Verb from Dr. Sherrilee for  pt to get ISOPSA. Pt made aware and voiced understanding.

## 2024-02-26 NOTE — Addendum Note (Signed)
 Addended by: GRETTA MASTERS R on: 02/26/2024 10:43 AM   Modules accepted: Orders

## 2024-02-26 NOTE — Telephone Encounter (Signed)
 Tried calling pt with no answer, LVM for return call to office.

## 2024-02-27 ENCOUNTER — Other Ambulatory Visit

## 2024-03-13 ENCOUNTER — Telehealth: Payer: Self-pay | Admitting: Urology

## 2024-03-13 DIAGNOSIS — N138 Other obstructive and reflux uropathy: Secondary | ICD-10-CM

## 2024-03-13 NOTE — Telephone Encounter (Addendum)
 Return call to pt to make him aware ISO PSA was routed to MD for review. Made pt aware I will have Dr. Sherrilee review his results and I will call him back with MD response. Return call to pt, vebal from MD, McKenzie for pt yo have Prostate MRI and someone will call to scheduled once MRI is authorized. Order placed for prostate MRI

## 2024-03-13 NOTE — Addendum Note (Signed)
 Addended by: GRETTA MASTERS R on: 03/13/2024 01:55 PM   Modules accepted: Orders

## 2024-03-13 NOTE — Telephone Encounter (Signed)
 Patient returned call requesting ISO PSA results.   Test was completed on 02/27/2024.   Best number to contact patient [phone] Call transferred to clinical basket.   Patient is very anxious and would like a call back today

## 2024-12-15 ENCOUNTER — Ambulatory Visit: Admitting: Neurology

## 2025-01-11 ENCOUNTER — Other Ambulatory Visit

## 2025-01-18 ENCOUNTER — Ambulatory Visit: Admitting: Urology
